# Patient Record
Sex: Male | Born: 1975 | Race: White | Hispanic: No | Marital: Single | Smoking: Current every day smoker
Health system: Southern US, Community
[De-identification: ages and names within clinical notes are randomized; demographics above are authoritative.]

## PROBLEM LIST (undated history)

## (undated) DIAGNOSIS — I82409 Acute embolism and thrombosis of unspecified deep veins of unspecified lower extremity: Secondary | ICD-10-CM

## (undated) DIAGNOSIS — I2699 Other pulmonary embolism without acute cor pulmonale: Secondary | ICD-10-CM

## (undated) DIAGNOSIS — Z7901 Long term (current) use of anticoagulants: Secondary | ICD-10-CM

## (undated) DIAGNOSIS — I509 Heart failure, unspecified: Secondary | ICD-10-CM

## (undated) DIAGNOSIS — I4891 Unspecified atrial fibrillation: Secondary | ICD-10-CM

## (undated) DIAGNOSIS — I499 Cardiac arrhythmia, unspecified: Secondary | ICD-10-CM

## (undated) DIAGNOSIS — R011 Cardiac murmur, unspecified: Secondary | ICD-10-CM

## (undated) DIAGNOSIS — Z86718 Personal history of other venous thrombosis and embolism: Secondary | ICD-10-CM

## (undated) DIAGNOSIS — I428 Other cardiomyopathies: Secondary | ICD-10-CM

## (undated) HISTORY — PX: INSERTION OF VENA CAVA FILTER: SHX5871

## (undated) HISTORY — DX: Heart failure, unspecified: I50.9

---
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
## 2005-12-10 ENCOUNTER — Emergency Department: Payer: Self-pay | Admitting: Emergency Medicine

## 2005-12-10 ENCOUNTER — Other Ambulatory Visit: Payer: Self-pay

## 2005-12-21 ENCOUNTER — Other Ambulatory Visit: Payer: Self-pay

## 2005-12-22 ENCOUNTER — Inpatient Hospital Stay: Payer: Self-pay

## 2007-05-11 IMAGING — CR DG CHEST 2V
1 series · 2 of 2 positions shown · non-contrast
Comparison: none

REASON FOR EXAM: cp
COMMENTS:  LMP: (Male)

[Series 3202: postero_anterior · 0.11mm/px · 2 of 2 slices shown]
[im 1/2]
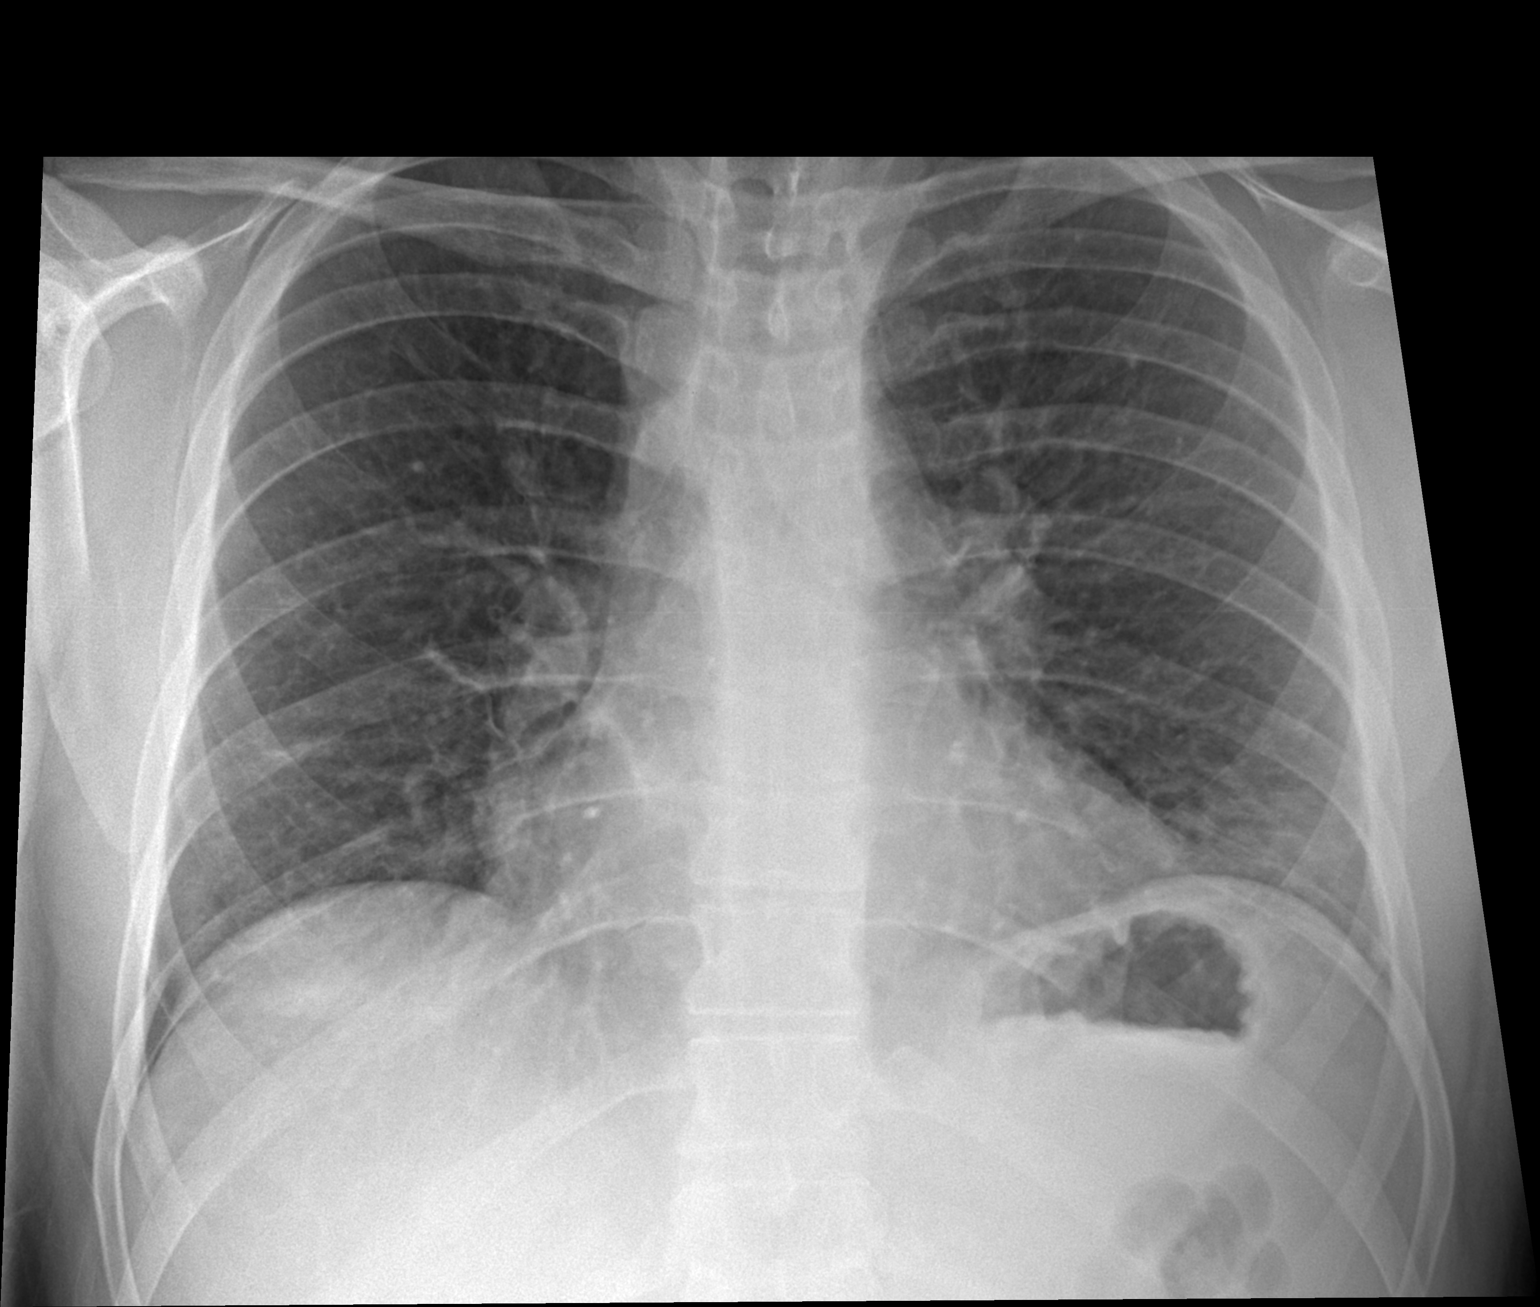
[im 2/2]
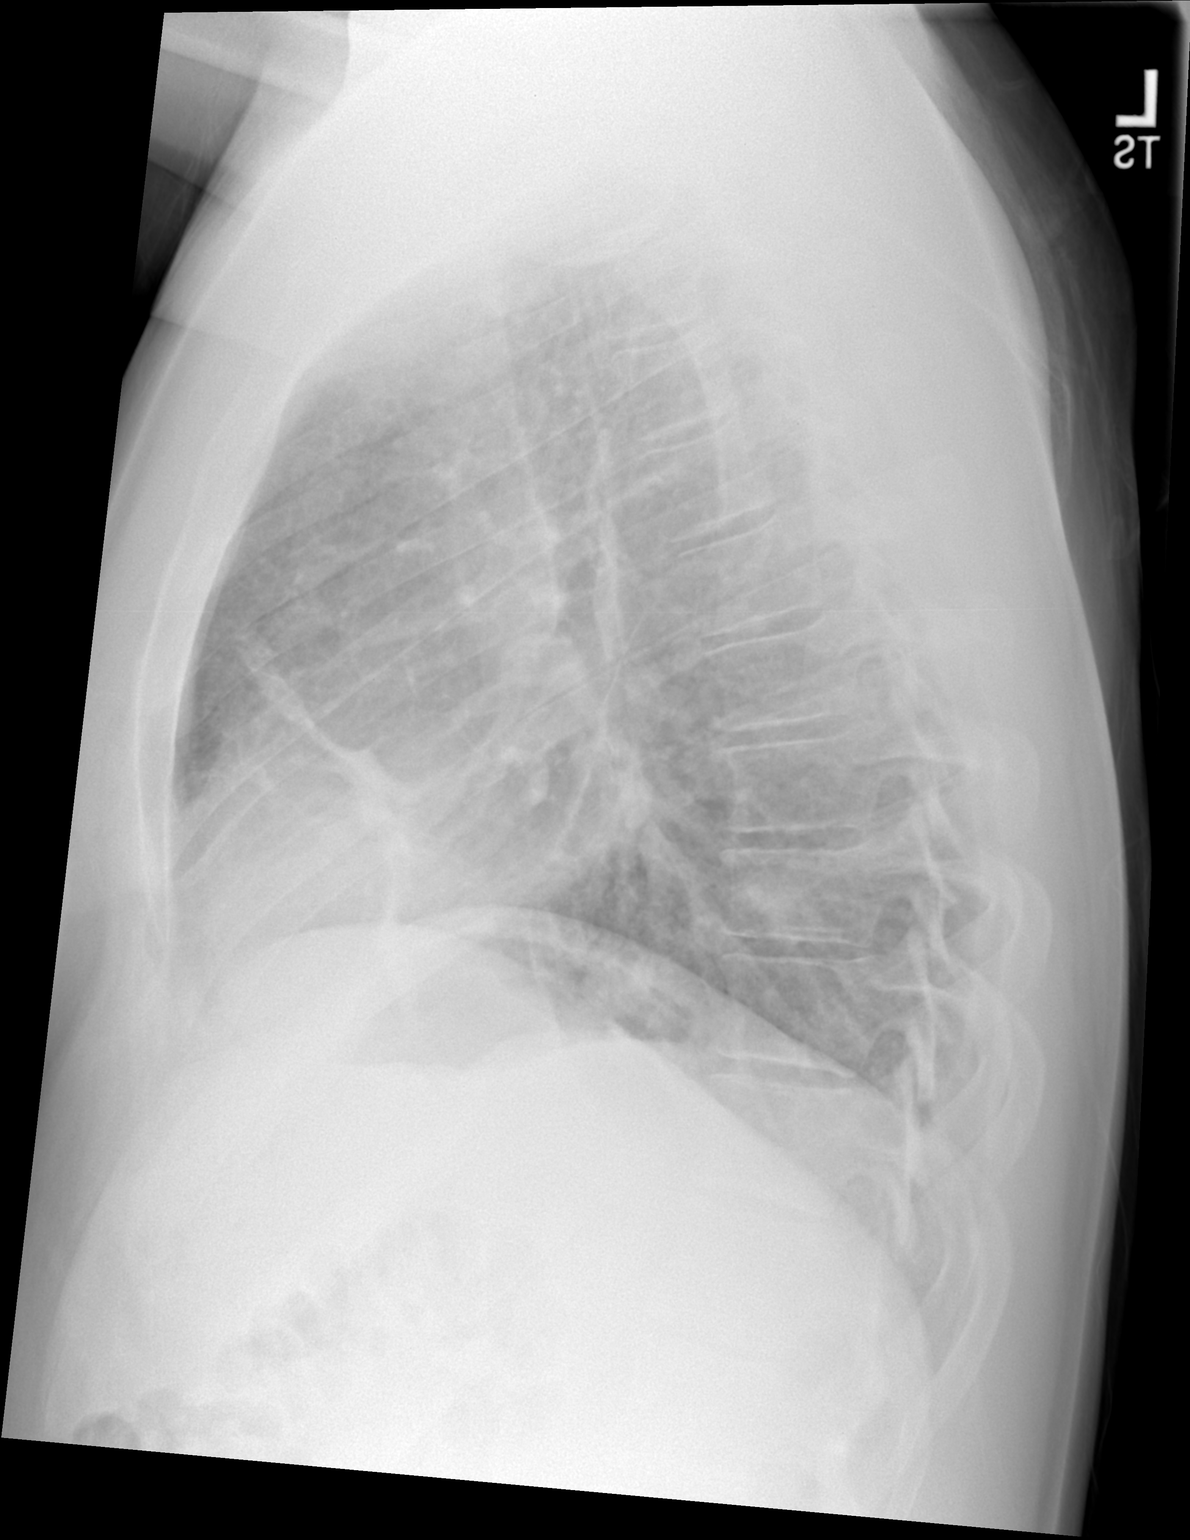

[2 of 2 positions shown; findings below may reference images not displayed]

PROCEDURE:     DXR - DXR CHEST PA (OR AP) AND LATERAL  - December 10, 2005  [DATE]

RESULT:     There is some minimally increased density at the LEFT lung base
and there is increased density projecting anteriorly on the lateral.
Increased linear density appears to be present at the RIGHT lung base.  The
heart is at the upper limits of normal in size.  There is no effusion.
There is no pneumothorax.
IMPRESSION: Areas of increased density of both lung bases which can be consistent with
areas of bibasilar infiltrate or atelectasis.  Pleural or parenchymal
fibrotic changes could give a similar appearance.

## 2010-04-30 ENCOUNTER — Ambulatory Visit: Payer: Self-pay | Admitting: Oncology

## 2010-05-12 ENCOUNTER — Inpatient Hospital Stay: Payer: Self-pay | Admitting: Internal Medicine

## 2010-05-20 ENCOUNTER — Ambulatory Visit: Payer: Self-pay | Admitting: Internal Medicine

## 2010-05-30 ENCOUNTER — Ambulatory Visit: Payer: Self-pay | Admitting: Oncology

## 2011-03-24 ENCOUNTER — Emergency Department: Payer: Self-pay | Admitting: Emergency Medicine

## 2013-09-27 ENCOUNTER — Emergency Department: Payer: Self-pay | Admitting: Emergency Medicine

## 2013-09-27 LAB — CBC WITH DIFFERENTIAL/PLATELET
Basophil #: 0 10*3/uL (ref 0.0–0.1)
Basophil %: 0.5 %
Eosinophil #: 0.4 10*3/uL (ref 0.0–0.7)
Eosinophil %: 5 %
HCT: 45.7 % (ref 40.0–52.0)
HGB: 15.9 g/dL (ref 13.0–18.0)
Lymphocyte #: 3.4 10*3/uL (ref 1.0–3.6)
Lymphocyte %: 39.9 %
MCH: 31.2 pg (ref 26.0–34.0)
MCHC: 34.7 g/dL (ref 32.0–36.0)
MCV: 90 fL (ref 80–100)
Monocyte #: 0.6 x10 3/mm (ref 0.2–1.0)
Monocyte %: 7.5 %
Neutrophil #: 4.1 10*3/uL (ref 1.4–6.5)
Neutrophil %: 47.1 %
Platelet: 216 10*3/uL (ref 150–440)
RBC: 5.08 10*6/uL (ref 4.40–5.90)
RDW: 12.7 % (ref 11.5–14.5)
WBC: 8.6 10*3/uL (ref 3.8–10.6)

## 2013-09-27 LAB — COMPREHENSIVE METABOLIC PANEL
Albumin: 3.8 g/dL (ref 3.4–5.0)
Alkaline Phosphatase: 94 U/L (ref 50–136)
Anion Gap: 5 — ABNORMAL LOW (ref 7–16)
BUN: 15 mg/dL (ref 7–18)
Bilirubin,Total: 0.5 mg/dL (ref 0.2–1.0)
Calcium, Total: 9.2 mg/dL (ref 8.5–10.1)
Chloride: 105 mmol/L (ref 98–107)
Co2: 26 mmol/L (ref 21–32)
Creatinine: 0.93 mg/dL (ref 0.60–1.30)
EGFR (African American): >60
EGFR (Non-African Amer.): >60
Glucose: 98 mg/dL (ref 65–99)
Osmolality: 273 (ref 275–301)
Potassium: 3.9 mmol/L (ref 3.5–5.1)
SGOT(AST): 23 U/L (ref 15–37)
SGPT (ALT): 51 U/L (ref 12–78)
Sodium: 136 mmol/L (ref 136–145)
Total Protein: 7.4 g/dL (ref 6.4–8.2)

## 2013-09-27 LAB — TROPONIN I: Troponin-I: <0.02 ng/mL

## 2013-09-27 LAB — TSH: Thyroid Stimulating Horm: 2.11 u[IU]/mL

## 2014-03-29 ENCOUNTER — Encounter: Payer: Self-pay | Admitting: Cardiovascular Disease

## 2014-04-03 ENCOUNTER — Encounter: Payer: Self-pay | Admitting: Cardiovascular Disease

## 2015-03-15 ENCOUNTER — Emergency Department
Admit: 2015-03-15 | Disposition: A | Discharge status: Home / Self Care | Payer: Self-pay | Admitting: Emergency Medicine

## 2015-04-07 ENCOUNTER — Encounter: Payer: Self-pay | Admitting: *Deleted

## 2015-04-07 ENCOUNTER — Emergency Department
Admission: EM | Admit: 2015-04-07 | Discharge: 2015-04-07 | Disposition: A | Discharge status: Home / Self Care | Payer: Self-pay | Attending: Emergency Medicine | Admitting: Emergency Medicine

## 2015-04-07 DIAGNOSIS — K029 Dental caries, unspecified: Secondary | ICD-10-CM | POA: Insufficient documentation

## 2015-04-07 DIAGNOSIS — Z720 Tobacco use: Secondary | ICD-10-CM | POA: Insufficient documentation

## 2015-04-07 DIAGNOSIS — K047 Periapical abscess without sinus: Secondary | ICD-10-CM | POA: Insufficient documentation

## 2015-04-07 HISTORY — DX: Personal history of other venous thrombosis and embolism: Z86.718

## 2015-04-07 MED ORDER — KETOROLAC TROMETHAMINE 10 MG PO TABS: 10.0000 mg | ORAL_TABLET | Freq: Three times a day (TID) | ORAL | Status: DC

## 2015-04-07 MED ORDER — PENICILLIN V POTASSIUM 500 MG PO TABS: 500.0000 mg | ORAL_TABLET | Freq: Four times a day (QID) | ORAL | Status: DC

## 2015-04-07 NOTE — Discharge Instructions (Signed)
Abscess An abscess (boil or furuncle) is an infected area on or under the skin. This area is filled with yellowish-white fluid (pus) and other material (debris). HOME CARE   Only take medicines as told by your doctor.  If you were given antibiotic medicine, take it as directed. Finish the medicine even if you start to feel better.  If gauze is used, follow your doctor's directions for changing the gauze.  To avoid spreading the infection:  Keep your abscess covered with a bandage.  Wash your hands well.  Do not share personal care items, towels, or whirlpools with others.  Avoid skin contact with others.  Keep your skin and clothes clean around the abscess.  Keep all doctor visits as told. GET HELP RIGHT AWAY IF:   You have more pain, puffiness (swelling), or redness in the wound site.  You have more fluid or blood coming from the wound site.  You have muscle aches, chills, or you feel sick.  You have a fever. MAKE SURE YOU:   Understand these instructions.  Will watch your condition.  Will get help right away if you are not doing well or get worse. Document Released: 05/04/2008 Document Revised: 05/17/2012 Document Reviewed: 01/29/2012 [facility] Patient Information 2015 [place], [place]. This information is not intended to replace advice given to you by your health care provider. Make sure you discuss any questions you have with your health care provider.  Dental Care and Dentist Visits Dental care supports good overall health. Regular dental visits can also help you avoid dental pain, bleeding, infection, and other more serious health problems in the future. It is important to keep the mouth healthy because diseases in the teeth, gums, and other oral tissues can spread to other areas of the body. Some problems, such as diabetes, heart disease, and pre-term labor have been associated with poor oral health.  See your dentist every 6 months. If you experience emergency  problems such as a toothache or broken tooth, go to the dentist right away. If you see your dentist regularly, you may catch problems early. It is easier to be treated for problems in the early stages.  WHAT TO EXPECT AT A DENTIST VISIT  Your dentist will look for many common oral health problems and recommend proper treatment. At your regular dental visit, you can expect:  Gentle cleaning of the teeth and gums. This includes scraping and polishing. This helps to remove the sticky substance around the teeth and gums (plaque). Plaque forms in the mouth shortly after eating. Over time, plaque hardens on the teeth as tartar. If tartar is not removed regularly, it can cause problems. Cleaning also helps remove stains.  Periodic X-rays. These pictures of the teeth and supporting bone will help your dentist assess the health of your teeth.  Periodic fluoride treatments. Fluoride is a natural mineral shown to help strengthen teeth. Fluoride treatmentinvolves applying a fluoride gel or varnish to the teeth. It is most commonly done in children.  Examination of the mouth, tongue, jaws, teeth, and gums to look for any oral health problems, such as:  Cavities (dental caries). This is decay on the tooth caused by plaque, sugar, and acid in the mouth. It is best to catch a cavity when it is small.  Inflammation of the gums caused by plaque buildup (gingivitis).  Problems with the mouth or malformed or misaligned teeth.  Oral cancer or other diseases of the soft tissues or jaws. KEEP YOUR TEETH AND GUMS HEALTHY For  healthy teeth and gums, follow these general guidelines as well as your dentist's specific advice:  Have your teeth professionally cleaned at the dentist every 6 months.  Brush twice daily with a fluoride toothpaste.  Floss your teeth daily.  Ask your dentist if you need fluoride supplements, treatments, or fluoride toothpaste.  Eat a healthy diet. Reduce foods and drinks with added  sugar.  Avoid smoking. TREATMENT FOR ORAL HEALTH PROBLEMS If you have oral health problems, treatment varies depending on the conditions present in your teeth and gums.  Your caregiver will most likely recommend good oral hygiene at each visit.  For cavities, gingivitis, or other oral health disease, your caregiver will perform a procedure to treat the problem. This is typically done at a separate appointment. Sometimes your caregiver will refer you to another dental specialist for specific tooth problems or for surgery. SEEK IMMEDIATE DENTAL CARE IF:  You have pain, bleeding, or soreness in the gum, tooth, jaw, or mouth area.  A permanent tooth becomes loose or separated from the gum socket.  You experience a blow or injury to the mouth or jaw area. Document Released: 07/29/2011 Document Revised: 02/08/2012 Document Reviewed: 07/29/2011 [facility] Patient Information 2015 [place], [place]. This information is not intended to replace advice given to you by your health care provider. Make sure you discuss any questions you have with your health care provider.  OPTIONS FOR DENTAL FOLLOW UP CARE  [place] Department of Health and Human Services - Local Safety Net Dental Clinics TripDoors.com.htm   [facility] [phone])  [facility] [phone])  [place] [phone])  [facility] [phone])  [facility] [phone]) This clinic caters to the indigent population and is on a lottery system. Location: [employer] [facility], [employer], [address] [place] Clinic Hours: Wednesdays from 6pm - 9pm, patients seen by a lottery system. For dates, call or go to [email or web address] Services: Cleanings, fillings and simple extractions. Payment Options: DENTAL WORK IS FREE OF CHARGE. Bring proof of income or support. Best way to  get seen: Arrive at 5:15 pm - this is a lottery, NOT first come/first serve, so arriving earlier will not increase your chances of being seen.     [facility] [phone] Select option 1 for emergencies   Location: [facility], [place], [address], [place] Clinic Hours: No walk-ins accepted - call the day before to schedule an appointment. Check in times are 9:30 am and 1:30 pm. Services: Simple extractions, temporary fillings, pulpectomy/pulp debridement, uncomplicated abscess drainage. Payment Options: PAYMENT IS DUE AT THE TIME OF SERVICE.  Fee is usually $100-200, additional surgical procedures (e.g. abscess drainage) may be extra. Cash, checks, Visa/MasterCard accepted.  Can file Medicaid if patient is covered for dental - patient should call case worker to check. No discount for [facility] patients. Best way to get seen: MUST call the day before and get onto the schedule. Can usually be seen the next 1-2 days. No walk-ins accepted.     [facility] [phone]   Location: [facility], [address] [place] Clinic Hours: M, W, Th, F 8am or 1:30pm, Tues 9a or 1:30 - first come/first served. Services: Simple extractions, temporary fillings, uncomplicated abscess drainage.  You do not need to be an [facility] resident. Payment Options: PAYMENT IS DUE AT THE TIME OF SERVICE. Dental insurance, otherwise sliding scale - bring proof of  income or support. Depending on income and treatment needed, cost is usually $50-200. Best way to get seen: Arrive early as it is first come/first served.     [facility] [phone]   Location: [address] Clinic Hours: Mon-Thu 8a-5p Services: Most basic dental services including extractions and fillings. Payment Options: PAYMENT IS DUE AT THE TIME OF SERVICE. Sliding scale, up to 50% off  - bring proof if income or support. Medicaid with dental option accepted. Best way to get seen: Call to schedule an appointment, can usually be seen within 2 weeks OR they will try to see walk-ins - show up at 8a or 2p (you may have to wait).     [facility] [phone] [place] RESIDENTS ONLY   Location: [facility], [address] [place], [place] [address] Clinic Hours: By appointment only. Monday - Thursday 8am-5pm, Friday 8am-12pm Services: Cleanings, fillings, extractions. Payment Options: PAYMENT IS DUE AT THE TIME OF SERVICE. Cash, Visa or MasterCard. Sliding scale - $30 minimum per service. Best way to get seen: Come in to office, complete packet and make an appointment - need proof of income or support monies for each household member and proof of [facility] residence. Usually takes about a month to get in.     [facility] [phone]   Location: [address] [facility] Clinic Hours: Walk-in Urgent Care Dental Services are offered Monday-Friday mornings only. The numbers of emergencies accepted daily is limited to the number of providers available. Maximum 15 - Mondays, Wednesdays & Thursdays Maximum 10 - Tuesdays & Fridays Services: You do not need to be a [facility] resident to be seen for a dental emergency. Emergencies are defined as pain, swelling, abnormal bleeding, or dental trauma. Walkins will receive x-rays if needed. NOTE: Dental cleaning is not an emergency. Payment Options: PAYMENT IS DUE AT THE TIME OF SERVICE. Minimum co-pay is $40.00 for uninsured patients. Minimum co-pay is $3.00 for Medicaid with dental coverage. Dental Insurance is accepted and must be presented at time of visit. Medicare does not cover dental. Forms of payment: Cash, credit card, checks. Best way to get seen: If not previously registered with the clinic, walk-in dental registration begins at 7:15  am and is on a first come/first serve basis. If previously registered with the clinic, call to make an appointment.     [facility] [phone] [place] RESIDENTS ONLY   Location: [address], [place], [place] Clinic Hours: Mon-Thu 10a-2p Services: Extractions only! Payment Options: FREE (donations accepted) - bring proof of income or support Best way to get seen: Call and schedule an appointment OR come at 8am on the 1st Monday of every month (except for holidays) when it is first come/first served.     [facility] [phone]   Location: [address] [place], [place] Clinic Hours: Friday mornings Services, Payment Options, Best way to get seen: Call for info

## 2015-04-07 NOTE — ED Provider Notes (Signed)
[facility] Emergency Department Provider Note? ____________________________________________ ? Time seen:  1436 ? I have reviewed the triage vital signs and the nursing notes.  ________ HISTORY ? Chief Complaint No chief complaint on file.   HPI  Henry Dunn is a 39 y.o. male who reports to the ED with c/o left, lower jaw pain and swelling due to dental abscess x 1 week.  He denies fevers, chills, sweats, or nausea.  He has chronically broken molars and reports being referred for wisdom tooth extraction in the past.    Review of Systems  Constitutional: Negative for fever. Eyes: Negative for visual changes. ENT: Negative for sore throat. Cardiovascular: Negative for chest pain. Respiratory: Negative for shortness of breath. Gastrointestinal: Negative for abdominal pain, vomiting and diarrhea. Musculoskeletal: Negative for back pain. Skin: Negative for rash. Neurological: Negative for headaches, focal weakness or numbness.  10-point ROS otherwise negative. ____________________________________________  No past medical history on file.  There are no active problems to display for this patient. ? No past surgical history on file. ? Current Outpatient Rx  Name  Route  Sig  Dispense  Refill  . ketorolac (TORADOL) 10 MG tablet   Oral   Take 1 tablet (10 mg total) by mouth every 8 (eight) hours.   15 tablet   0   . penicillin v potassium (VEETID) 500 MG tablet   Oral   Take 1 tablet (500 mg total) by mouth 4 (four) times daily.   40 tablet   0    ? Allergies Review of patient's allergies indicates not on file. ? No family history on file. ? Social History History  Substance Use Topics  . Smoking status: Not on file  . Smokeless tobacco: Not on file  . Alcohol Use: Not on file   PHYSICAL EXAM:  VITAL SIGNS: ED Triage Vitals  Enc Vitals Group     BP --      Pulse --      Resp --      Temp --      Temp src --      SpO2 --       Weight --      Height --      Head Cir --      Peak Flow --      Pain Score --      Pain Loc --      Pain Edu? --      Excl. in GC? --    Constitutional: Alert and oriented. Well appearing and in no distress. Eyes: Conjunctivae are normal. PERRL. Normal extraocular movements. ENT   Head: Normocephalic and atraumatic.   Nose: No congestion/rhinnorhea.   Mouth/Throat: Mucous membranes are moist. Left lower molar #1 missing. Molar #2 with cavity & chronic fracture. Molar #3 Malpositioned. No local abscess formation or gum swelling at molars. Some mild jaw fullness at the lower eye teeth.       Ears: Normal external exam. Canals clear. TMs clear bilaterally.   Neck: Supple. No lymphadenopathy. Cardiovascular: Normal rate, regular rhythm. Normal and symmetric distal pulses are present in all extremities. No murmurs, rubs, or gallops. Respiratory: Normal respiratory effort without tachypnea nor retractions. Breath sounds are clear and equal bilaterally. No wheezes/rales/rhonchi. Musculoskeletal: Normal range of motion in all extremities.  Neurologic:  Normal speech and language. No gross focal neurologic deficits are appreciated.  Skin:  Skin is warm, dry and intact. No rash noted. Psychiatric: Mood and affect are normal. Patient  exhibits appropriate insight and judgment. _____________ PROCEDURES ? Procedure(s) performed: None  Critical Care performed: None  ______________________________________________________ INITIAL IMPRESSION / ASSESSMENT AND PLAN / ED COURSE ? Patient given info about local dental providers.   Pertinent labs & imaging results that were available during my care of the patient were reviewed by me and considered in my medical decision making (see chart for details). ___________________________________________ FINAL CLINICAL IMPRESSION(S) / ED DIAGNOSES?  Final diagnoses:  Dental caries  Dental infection      Lissa Hoard,  PA-C 04/07/15 1654  Jene Every, MD 04/07/15 2012

## 2015-04-07 NOTE — ED Notes (Signed)
Pt reports tooth pain beginning 4 weeks ago. Pt seen in ED 3 weeks ago. Pt verbalized not going to dentist and just finishing antibiotics from previous visit without relief.

## 2015-05-20 ENCOUNTER — Encounter: Payer: Self-pay | Admitting: Emergency Medicine

## 2015-05-20 ENCOUNTER — Emergency Department
Admission: EM | Admit: 2015-05-20 | Discharge: 2015-05-20 | Disposition: A | Discharge status: Home / Self Care | Payer: Self-pay | Attending: Emergency Medicine | Admitting: Emergency Medicine

## 2015-05-20 DIAGNOSIS — K047 Periapical abscess without sinus: Secondary | ICD-10-CM | POA: Insufficient documentation

## 2015-05-20 DIAGNOSIS — Z792 Long term (current) use of antibiotics: Secondary | ICD-10-CM | POA: Insufficient documentation

## 2015-05-20 DIAGNOSIS — Z720 Tobacco use: Secondary | ICD-10-CM | POA: Insufficient documentation

## 2015-05-20 MED ORDER — CLINDAMYCIN PHOSPHATE 900 MG/6ML IV SOLN
INTRAVENOUS | Status: AC
  Filled 2015-05-20: qty 6

## 2015-05-20 MED ORDER — IBUPROFEN 800 MG PO TABS: 800.0000 mg | ORAL_TABLET | Freq: Three times a day (TID) | ORAL | Status: DC

## 2015-05-20 MED ORDER — CLINDAMYCIN HCL 150 MG PO CAPS: ORAL_CAPSULE | ORAL | Status: DC

## 2015-05-20 MED ORDER — CLINDAMYCIN PHOSPHATE 600 MG/4ML IJ SOLN: 600.0000 mg | Freq: Once | INTRAMUSCULAR | Status: DC

## 2015-05-20 MED ORDER — CLINDAMYCIN PHOSPHATE 600 MG/4ML IJ SOLN
600.0000 mg | Freq: Once | INTRAMUSCULAR | Status: AC
  Administered 2015-05-20: 600 mg via INTRAMUSCULAR
  Filled 2015-05-20: qty 4

## 2015-05-20 NOTE — ED Notes (Signed)
Pt reports that he has been having trouble with his wisdom teeth. He states that he felt pressure last night when he was eating and woke up with right sided facial swelling. States that tooth is not hurting at this time.

## 2015-05-20 NOTE — Discharge Instructions (Signed)
Dental Abscess °A dental abscess is a collection of infected fluid (pus) from a bacterial infection in the inner part of the tooth (pulp). It usually occurs at the end of the tooth's root.  °CAUSES  °· Severe tooth decay. °· Trauma to the tooth that allows bacteria to enter into the pulp, such as a broken or chipped tooth. °SYMPTOMS  °· Severe pain in and around the infected tooth. °· Swelling and redness around the abscessed tooth or in the mouth or face. °· Tenderness. °· Pus drainage. °· Bad breath. °· Bitter taste in the mouth. °· Difficulty swallowing. °· Difficulty opening the mouth. °· Nausea. °· Vomiting. °· Chills. °· Swollen neck glands. °DIAGNOSIS  °· A medical and dental history will be taken. °· An examination will be performed by tapping on the abscessed tooth. °· X-rays may be taken of the tooth to identify the abscess. °TREATMENT °The goal of treatment is to eliminate the infection. You may be prescribed antibiotic medicine to stop the infection from spreading. A root canal may be performed to save the tooth. If the tooth cannot be saved, it may be pulled (extracted) and the abscess may be drained.  °HOME CARE INSTRUCTIONS °· Only take over-the-counter or prescription medicines for pain, fever, or discomfort as directed by your caregiver. °· Rinse your mouth (gargle) often with salt water (¼ tsp salt in 8 oz [250 ml] of warm water) to relieve pain or swelling. °· Do not drive after taking pain medicine (narcotics). °· Do not apply heat to the outside of your face. °· Return to your dentist for further treatment as directed. °SEEK MEDICAL CARE IF: °· Your pain is not helped by medicine. °· Your pain is getting worse instead of better. °SEEK IMMEDIATE MEDICAL CARE IF: °· You have a fever or persistent symptoms for more than 2-3 days. °· You have a fever and your symptoms suddenly get worse. °· You have chills or a very bad headache. °· You have problems breathing or swallowing. °· You have trouble  opening your mouth. °· You have swelling in the neck or around the eye. °Document Released: 11/16/2005 Document Revised: 08/10/2012 Document Reviewed: 02/24/2011 °Exit[employer]® Patient Information ©2015 [employer]. This information is not intended to replace advice given to you by your health care provider. Make sure you discuss any questions you have with your health care provider. ° ° °OPTIONS FOR DENTAL FOLLOW UP CARE ° °[place] Department of Health and Human Services - Local Safety Net Dental Clinics °http://www.ncdhhs.gov/dph/oralhealth/services/safetynetclinics.htm °  °[facility] ([phone]) ° °[facility] ([phone]) ° °[facility] ([phone]) ° °[facility] ([phone]) ° °[facility] ([phone]) °This clinic caters to the indigent population and is on a lottery system. °Location: °[facility], [place], [address], [place] °Clinic Hours: °Wednesdays from 6pm - 9pm, patients seen by a lottery system. °For dates, call or go to www.med.[facility].edu/[facility]/patients/Dental-[facility] °Services: °Cleanings, fillings and simple extractions. °Payment Options: °DENTAL WORK IS FREE OF CHARGE. Bring proof of income or support. °Best way to get seen: °Arrive at 5:15 pm - this is a lottery, NOT first come/first serve, so arriving earlier will not increase your chances of being seen. °  °  °[facility] °[phone] °Select option 1 for emergencies °  °Location: °[facility] [place], [address], [place] °Clinic Hours: °No walk-ins accepted - call the day before to schedule an appointment. °Check in times   are 9:30 am and 1:30 pm. °Services: °Simple extractions, temporary fillings, pulpectomy/pulp debridement, uncomplicated abscess drainage. °Payment Options: °PAYMENT IS DUE AT THE TIME OF SERVICE.  Fee is usually $100-200, additional surgical procedures (e.g. abscess drainage) may  be extra. °Cash, checks, Visa/MasterCard accepted.  Can file Medicaid if patient is covered for dental - patient should call case worker to check. °No discount for [facility] patients. °Best way to get seen: °MUST call the day before and get onto the schedule. Can usually be seen the next 1-2 days. No walk-ins accepted. °  °  °[facility] °[phone] °  °Location: °[facility], [address], [place] °Clinic Hours: °M, W, Th, F 8am or 1:30pm, Tues 9a or 1:30 - first come/first served. °Services: °Simple extractions, temporary fillings, uncomplicated abscess drainage.  You do not need to be an [place] resident. °Payment Options: °PAYMENT IS DUE AT THE TIME OF SERVICE. °Dental insurance, otherwise sliding scale - bring proof of income or support. °Depending on income and treatment needed, cost is usually $50-200. °Best way to get seen: °Arrive early as it is first come/first served. °  °  °[facility] °[phone] °  °Location: °[address] °Clinic Hours: °Mon-Thu 8a-5p °Services: °Most basic dental services including extractions and fillings. °Payment Options: °PAYMENT IS DUE AT THE TIME OF SERVICE. °Sliding scale, up to 50% off - bring proof if income or support. °Medicaid with dental option accepted. °Best way to get seen: °Call to schedule an appointment, can usually be seen within 2 weeks OR they will try to see walk-ins - show up at 8a or 2p (you may have to wait). °  °  °[facility] °[phone] °[place] RESIDENTS ONLY °  °Location: °[facility], [address], [place], [place] [address] °Clinic Hours: By appointment only. °Monday - Thursday 8am-5pm, Friday 8am-12pm °Services: Cleanings, fillings, extractions. °Payment Options: °PAYMENT IS DUE AT THE TIME OF SERVICE. °Cash, Visa or MasterCard. Sliding scale - $30 minimum per service. °Best way to get seen: °Come in to  office, complete packet and make an appointment - need proof of income °or support monies for each household member and proof of [place] residence. °Usually takes about a month to get in. °  °  °[facility] °[phone] °  °Location: °[address] [place] °Clinic Hours: Walk-in Urgent Care Dental Services are offered Monday-Friday mornings only. °The numbers of emergencies accepted daily is limited to the number of °providers available. °Maximum 15 - Mondays, Wednesdays & Thursdays °Maximum 10 - Tuesdays & Fridays °Services: °You do not need to be a [place] resident to be seen for a dental emergency. °Emergencies are defined as pain, swelling, abnormal bleeding, or dental trauma. Walkins will receive x-rays if needed. °NOTE: Dental cleaning is not an emergency. °Payment Options: °PAYMENT IS DUE AT THE TIME OF SERVICE. °Minimum co-pay is $40.00 for uninsured patients. °Minimum co-pay is $3.00 for Medicaid with dental coverage. °Dental Insurance is accepted and must be presented at time of visit. °Medicare does not cover dental. °Forms of payment: Cash, credit card, checks. °Best way to get seen: °If not previously registered with the clinic, walk-in dental registration begins at 7:15 am and is on a first come/first serve basis. °If previously registered with the clinic, call to make an appointment. °  °  °[facility] °[phone] °[place] RESIDENTS ONLY °  °Location: °[address],   [place], [place] °Clinic Hours: °Mon-Thu 10a-2p °Services: Extractions only! °Payment Options: °FREE (donations accepted) - bring proof of income or support °Best way to get seen: °Call and schedule an appointment OR come at 8am on the 1st Monday of every month (except for holidays) when it is first come/first served. °  °  °[facility] °[phone] °  °Location: °[address], [place] °Clinic Hours: °Friday mornings °Services, Payment Options, Best way to get  seen: °Call for info °

## 2015-05-20 NOTE — ED Notes (Signed)
Pt informed to return if any life threatening symptoms occur.  

## 2015-05-20 NOTE — ED Provider Notes (Signed)
[facility] Emergency Department Provider Note  ____________________________________________  Time seen:  10:21 AM  I have reviewed the triage vital signs and the nursing notes.   HISTORY  Chief Complaint Oral Swelling   HPI Henry Dunn is a 39 y.o. male complaining with right-sided facial swelling. He states that it was a little swollen last night, but much worse this morning. He states his teeth do not hurt but he has had problems with his teeth in the past especially his wisdom teeth. He has been taking an over-the-counter "Chinese amoxicillin" off and on for a month. He denies any fever or chills until last night he felt a little warm as temperature was 100.4. Currently his pain is 0/10.Marland Kitchen   Past Medical History  Diagnosis Date  . Hx of blood clots     in left legs and lungs    There are no active problems to display for this patient.   History reviewed. No pertinent past surgical history.  Current Outpatient Rx  Name  Route  Sig  Dispense  Refill  . clindamycin (CLEOCIN) 150 MG capsule      2 tablets q 6hours until finished   56 capsule   0   . ibuprofen (ADVIL,MOTRIN) 800 MG tablet   Oral   Take 1 tablet (800 mg total) by mouth 3 (three) times daily.   30 tablet   0   . ketorolac (TORADOL) 10 MG tablet   Oral   Take 1 tablet (10 mg total) by mouth every 8 (eight) hours.   15 tablet   0   . penicillin v potassium (VEETID) 500 MG tablet   Oral   Take 1 tablet (500 mg total) by mouth 4 (four) times daily.   40 tablet   0     Allergies Review of patient's allergies indicates no known allergies.  History reviewed. No pertinent family history.  Social History History  Substance Use Topics  . Smoking status: Current Every Day Smoker -- 0.50 packs/day    Types: Cigarettes  . Smokeless tobacco: Not on file  . Alcohol Use: Yes    Review of Systems Constitutional: Positive fever/no chills Eyes: No visual changes. ENT: No sore  throat. Cardiovascular: Denies chest pain. Respiratory: Denies shortness of breath. Gastrointestinal: No abdominal pain.  No nausea, no vomiting Genitourinary: Negative for dysuria. Musculoskeletal: Negative for back pain. Skin: Negative for rash. Neurological: Negative for headaches, focal weakness or numbness.  10-point ROS otherwise negative.  ____________________________________________   PHYSICAL EXAM:  VITAL SIGNS: ED Triage Vitals  Enc Vitals Group     BP 05/20/15 1007 141/116 mmHg     Pulse Rate 05/20/15 1007 100     Resp 05/20/15 1007 19     Temp 05/20/15 1007 98.2 F (36.8 C)     Temp Source 05/20/15 1007 Oral     SpO2 05/20/15 1007 99 %     Weight 05/20/15 1007 190 lb (86.183 kg)     Height 05/20/15 1007  (1.727 m)     Head Cir --      Peak Flow --      Pain Score --      Pain Loc --      Pain Edu? --      Excl. in GC? --     Constitutional: Alert and oriented. Well appearing and in no acute distress. Eyes: Conjunctivae are normal. PERRL. EOMI. Head: Atraumatic. Right facial edema without edema right orbital area Nose: No  congestion/rhinnorhea. Mouth/Throat: Mucous membranes are moist.  Oropharynx non-erythematous. Numerous cavities was noted.  His right upper wisdom tooth is actually below the gumline. There is moderate amount of gum edema and tenderness. No drainage was noted. Neck: No stridor.  Supple Hematological/Lymphatic/Immunilogical: No cervical lymphadenopathy. Cardiovascular: Normal rate, regular rhythm. Grossly normal heart sounds.  Good peripheral circulation. Respiratory: Normal respiratory effort.  No retractions. Lungs CTAB. Gastrointestinal: Soft and nontender. No distention. No abdominal bruits. No CVA tenderness. Musculoskeletal: No lower extremity tenderness nor edema.  No joint effusions. Neurologic:  Normal speech and language. No gross focal neurologic deficits are appreciated. Speech is normal. No gait instability. Skin:  Skin is  warm, dry and intact. No rash noted. Psychiatric: Mood and affect are normal. Speech and behavior are normal.  ____________________________________________   LABS (all labs ordered are listed, but only abnormal results are displayed)  Labs Reviewed - No data to display _ PROCEDURES  Procedure(s) performed: None  Critical Care performed: No  ____________________________________________   INITIAL IMPRESSION / ASSESSMENT AND PLAN / ED COURSE  Pertinent labs & imaging results that were available during my care of the patient were reviewed by me and considered in my medical decision making (see chart for details).  Patient is to discontinue the [place] medicine that he's been taking. He was started on clindamycin for infection ibuprofen for inflammation. He is given a list of dental clinics to follow up with. ____________________________________________   FINAL CLINICAL IMPRESSION(S) / ED DIAGNOSES  Final diagnoses:  Dental abscess      Tommi Rumps, PA-C 05/20/15 1345  Jene Every, MD 05/20/15 [phone]

## 2015-07-24 ENCOUNTER — Encounter: Payer: Self-pay | Admitting: Emergency Medicine

## 2015-07-24 ENCOUNTER — Emergency Department: Payer: Self-pay

## 2015-07-24 ENCOUNTER — Emergency Department
Admission: EM | Admit: 2015-07-24 | Discharge: 2015-07-24 | Disposition: A | Discharge status: Home / Self Care | Payer: Self-pay | Attending: Emergency Medicine | Admitting: Emergency Medicine

## 2015-07-24 DIAGNOSIS — I824Z2 Acute embolism and thrombosis of unspecified deep veins of left distal lower extremity: Secondary | ICD-10-CM | POA: Insufficient documentation

## 2015-07-24 DIAGNOSIS — M79605 Pain in left leg: Secondary | ICD-10-CM

## 2015-07-24 DIAGNOSIS — I82402 Acute embolism and thrombosis of unspecified deep veins of left lower extremity: Secondary | ICD-10-CM

## 2015-07-24 DIAGNOSIS — Z720 Tobacco use: Secondary | ICD-10-CM | POA: Insufficient documentation

## 2015-07-24 NOTE — ED Notes (Signed)
Reports hx of dvt, has not been taking meds.  Now has swelling to left lower ext.  Ambulates well.  +PMS to distal foot

## 2015-07-24 NOTE — ED Provider Notes (Signed)
[facility] Emergency Department Provider Note     Time seen: ----------------------------------------- 2:48 PM on 07/24/2015 -----------------------------------------    I have reviewed the triage vital signs and the nursing notes.   HISTORY  Chief Complaint Leg Swelling    HPI Henry Dunn is a 39 y.o. male who presents to ER for left lower extremity swelling.Patient states he had a history of left lower extremity blood clot and bilateral PEs. He's had a DVT filter put in, has complained of left leg cramping and swelling over the last several days. Patient states she stands a lot at work, denies fevers chills or other complaints.   Past Medical History  Diagnosis Date  . Hx of blood clots     in left legs and lungs    There are no active problems to display for this patient.   History reviewed. No pertinent past surgical history.  Allergies Review of patient's allergies indicates no known allergies.  Social History Social History  Substance Use Topics  . Smoking status: Current Every Day Smoker -- 0.50 packs/day    Types: Cigarettes  . Smokeless tobacco: None  . Alcohol Use: Yes    Review of Systems Constitutional: Negative for fever. Eyes: Negative for visual changes. ENT: Negative for sore throat. Cardiovascular: Negative for chest pain. Respiratory: Negative for shortness of breath. Gastrointestinal: Negative for abdominal pain, vomiting and diarrhea. Genitourinary: Negative for dysuria. Musculoskeletal: Negative for back pain. Positive for left lower extremity swelling and pain  Skin: Negative for rash. Neurological: Negative for headaches, focal weakness or numbness.  10-point ROS otherwise negative.  ____________________________________________   PHYSICAL EXAM:  VITAL SIGNS: ED Triage Vitals  Enc Vitals Group     BP 07/24/15 1309 126/85 mmHg     Pulse Rate 07/24/15 1309 81     Resp 07/24/15 1305 18     Temp  07/24/15 1305 99 F (37.2 C)     Temp Source 07/24/15 1305 Oral     SpO2 07/24/15 1309 98 %     Weight 07/24/15 1303 190 lb (86.183 kg)     Height 07/24/15 1303 5\' 8"  (1.727 m)     Head Cir --      Peak Flow --      Pain Score 07/24/15 1303 7     Pain Loc --      Pain Edu? --      Excl. in GC? --     Constitutional: Alert and oriented. Well appearing and in no distress. Eyes: Conjunctivae are normal. PERRL. Normal extraocular movements. ENT   Head: Normocephalic and atraumatic.   Nose: No congestion/rhinnorhea.   Mouth/Throat: Mucous membranes are moist.   Neck: No stridor. Cardiovascular: Normal rate, regular rhythm. Normal and symmetric distal pulses are present in all extremities. No murmurs, rubs, or gallops. Respiratory: Normal respiratory effort without tachypnea nor retractions. Breath sounds are clear and equal bilaterally. No wheezes/rales/rhonchi. Gastrointestinal: Soft and nontender. No distention. No abdominal bruits.  Musculoskeletal: Nontender with normal range of motion in all extremities. No joint effusions.  No lower extremity tenderness nor edema. Neurologic:  Normal speech and language. No gross focal neurologic deficits are appreciated. Speech is normal. No gait instability. Skin:  Skin is warm, dry and intact. No rash noted. Psychiatric: Mood and affect are normal. Speech and behavior are normal. Patient exhibits appropriate insight and judgment. ____________________________________________  ED COURSE:  Pertinent labs & imaging results that were available during my care of the patient were reviewed by me  and considered in my medical decision making (see chart for details). Patient will need left lower extremity ultrasound.  RADIOLOGY Images were viewed by me   IMPRESSION: Nonocclusive thrombus again noted in the superficial femoral and popliteal vein. Similar finding noted on prior study  09/27/2013.  ____________________________________________  FINAL ASSESSMENT AND PLAN  DVT  Plan: Patient with labs and imaging as dictated above. 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 We'll place him back on Eliquis, he was given a prescription and a [facility] for same. He is advised to have close follow-up with a primary care doctor for reevaluation.   Emily Filbert, MD   Emily Filbert, MD 07/24/15 [phone]

## 2017-02-17 ENCOUNTER — Emergency Department: Payer: Self-pay

## 2017-02-17 ENCOUNTER — Inpatient Hospital Stay
Admission: EM | Admit: 2017-02-17 | Discharge: 2017-02-25 | DRG: 300 | Disposition: A | Discharge status: Home / Self Care | Payer: Self-pay | Attending: Internal Medicine | Admitting: Internal Medicine

## 2017-02-17 DIAGNOSIS — I82412 Acute embolism and thrombosis of left femoral vein: Principal | ICD-10-CM | POA: Diagnosis present

## 2017-02-17 DIAGNOSIS — Z86711 Personal history of pulmonary embolism: Secondary | ICD-10-CM

## 2017-02-17 DIAGNOSIS — I4892 Unspecified atrial flutter: Secondary | ICD-10-CM | POA: Diagnosis present

## 2017-02-17 DIAGNOSIS — F17210 Nicotine dependence, cigarettes, uncomplicated: Secondary | ICD-10-CM | POA: Diagnosis present

## 2017-02-17 DIAGNOSIS — Z9114 Patient's other noncompliance with medication regimen: Secondary | ICD-10-CM

## 2017-02-17 DIAGNOSIS — L02419 Cutaneous abscess of limb, unspecified: Secondary | ICD-10-CM

## 2017-02-17 DIAGNOSIS — I82432 Acute embolism and thrombosis of left popliteal vein: Secondary | ICD-10-CM | POA: Diagnosis present

## 2017-02-17 DIAGNOSIS — I82509 Chronic embolism and thrombosis of unspecified deep veins of unspecified lower extremity: Secondary | ICD-10-CM

## 2017-02-17 DIAGNOSIS — F17200 Nicotine dependence, unspecified, uncomplicated: Secondary | ICD-10-CM

## 2017-02-17 DIAGNOSIS — I824Z2 Acute embolism and thrombosis of unspecified deep veins of left distal lower extremity: Secondary | ICD-10-CM

## 2017-02-17 DIAGNOSIS — L03116 Cellulitis of left lower limb: Secondary | ICD-10-CM | POA: Diagnosis present

## 2017-02-17 DIAGNOSIS — L02214 Cutaneous abscess of groin: Secondary | ICD-10-CM | POA: Diagnosis present

## 2017-02-17 DIAGNOSIS — Z86718 Personal history of other venous thrombosis and embolism: Secondary | ICD-10-CM

## 2017-02-17 DIAGNOSIS — M545 Low back pain: Secondary | ICD-10-CM

## 2017-02-17 DIAGNOSIS — I4430 Unspecified atrioventricular block: Secondary | ICD-10-CM | POA: Diagnosis present

## 2017-02-17 DIAGNOSIS — Z720 Tobacco use: Secondary | ICD-10-CM

## 2017-02-17 DIAGNOSIS — I4891 Unspecified atrial fibrillation: Secondary | ICD-10-CM | POA: Diagnosis present

## 2017-02-17 DIAGNOSIS — M5440 Lumbago with sciatica, unspecified side: Secondary | ICD-10-CM | POA: Diagnosis present

## 2017-02-17 DIAGNOSIS — R0602 Shortness of breath: Secondary | ICD-10-CM | POA: Diagnosis present

## 2017-02-17 DIAGNOSIS — I483 Typical atrial flutter: Secondary | ICD-10-CM

## 2017-02-17 DIAGNOSIS — R609 Edema, unspecified: Secondary | ICD-10-CM

## 2017-02-17 DIAGNOSIS — L03119 Cellulitis of unspecified part of limb: Secondary | ICD-10-CM

## 2017-02-17 DIAGNOSIS — I82409 Acute embolism and thrombosis of unspecified deep veins of unspecified lower extremity: Secondary | ICD-10-CM | POA: Diagnosis present

## 2017-02-17 HISTORY — DX: Other pulmonary embolism without acute cor pulmonale: I26.99

## 2017-02-17 HISTORY — DX: Acute embolism and thrombosis of unspecified deep veins of unspecified lower extremity: I82.409

## 2017-02-17 LAB — CBC
HCT: 46.3 % (ref 40.0–52.0)
Hemoglobin: 15.6 g/dL (ref 13.0–18.0)
MCH: 29.9 pg (ref 26.0–34.0)
MCHC: 33.6 g/dL (ref 32.0–36.0)
MCV: 88.8 fL (ref 80.0–100.0)
Platelets: 168 10*3/uL (ref 150–440)
RBC: 5.22 MIL/uL (ref 4.40–5.90)
RDW: 13.6 % (ref 11.5–14.5)
WBC: 10.4 10*3/uL (ref 3.8–10.6)

## 2017-02-17 LAB — URINE DRUG SCREEN, QUALITATIVE (ARMC ONLY)
Amphetamines, Ur Screen: NOT DETECTED
Barbiturates, Ur Screen: NOT DETECTED
Benzodiazepine, Ur Scrn: NOT DETECTED
Cannabinoid 50 Ng, Ur ~~LOC~~: NOT DETECTED
Cocaine Metabolite,Ur ~~LOC~~: NOT DETECTED
MDMA (Ecstasy)Ur Screen: NOT DETECTED
Methadone Scn, Ur: NOT DETECTED
Opiate, Ur Screen: NOT DETECTED
Phencyclidine (PCP) Ur S: NOT DETECTED
Tricyclic, Ur Screen: NOT DETECTED

## 2017-02-17 LAB — URINALYSIS, COMPLETE (UACMP) WITH MICROSCOPIC
Bacteria, UA: NONE SEEN
Bilirubin Urine: NEGATIVE
Glucose, UA: NEGATIVE mg/dL
Hgb urine dipstick: NEGATIVE
Ketones, ur: NEGATIVE mg/dL
Leukocytes, UA: NEGATIVE
Nitrite: NEGATIVE
Protein, ur: NEGATIVE mg/dL
Specific Gravity, Urine: >1.046 — ABNORMAL HIGH (ref 1.005–1.030)
Squamous Epithelial / LPF: NONE SEEN
pH: 5 (ref 5.0–8.0)

## 2017-02-17 LAB — BASIC METABOLIC PANEL
Anion gap: 7 (ref 5–15)
BUN: 15 mg/dL (ref 6–20)
CO2: 25 mmol/L (ref 22–32)
Calcium: 9.4 mg/dL (ref 8.9–10.3)
Chloride: 106 mmol/L (ref 101–111)
Creatinine, Ser: 0.85 mg/dL (ref 0.61–1.24)
GFR calc Af Amer: >60 mL/min (ref >60)
GFR calc non Af Amer: >60 mL/min (ref >60)
Glucose, Bld: 100 mg/dL — ABNORMAL HIGH (ref 65–99)
Potassium: 3.9 mmol/L (ref 3.5–5.1)
Sodium: 138 mmol/L (ref 135–145)

## 2017-02-17 LAB — TROPONIN I
Troponin I: <0.03 ng/mL (ref <0.03)
Troponin I: <0.03 ng/mL (ref <0.03)
Troponin I: <0.03 ng/mL (ref <0.03)

## 2017-02-17 LAB — PROTIME-INR
INR: 0.97
Prothrombin Time: 12.9 seconds (ref 11.4–15.2)

## 2017-02-17 LAB — APTT: aPTT: 26 seconds (ref 24–36)

## 2017-02-17 LAB — CK: Total CK: 29 U/L — ABNORMAL LOW (ref 49–397)

## 2017-02-17 MED ORDER — ACETAMINOPHEN 650 MG RE SUPP: 650.0000 mg | Freq: Four times a day (QID) | RECTAL | Status: DC | PRN

## 2017-02-17 MED ORDER — ENOXAPARIN SODIUM 40 MG/0.4ML ~~LOC~~ SOLN
40.0000 mg | SUBCUTANEOUS | Status: DC
  Administered 2017-02-17: 40 mg via SUBCUTANEOUS
  Filled 2017-02-17: qty 0.4

## 2017-02-17 MED ORDER — ONDANSETRON HCL 4 MG PO TABS: 4.0000 mg | ORAL_TABLET | Freq: Four times a day (QID) | ORAL | Status: DC | PRN

## 2017-02-17 MED ORDER — SODIUM CHLORIDE 0.9% FLUSH
3.0000 mL | Freq: Two times a day (BID) | INTRAVENOUS | Status: DC
  Administered 2017-02-17 – 2017-02-25 (×16): 3 mL via INTRAVENOUS

## 2017-02-17 MED ORDER — ACETAMINOPHEN 325 MG PO TABS: 650.0000 mg | ORAL_TABLET | Freq: Four times a day (QID) | ORAL | Status: DC | PRN

## 2017-02-17 MED ORDER — IOPAMIDOL (ISOVUE-370) INJECTION 76%
75.0000 mL | Freq: Once | INTRAVENOUS | Status: AC | PRN
  Administered 2017-02-17: 75 mL via INTRAVENOUS

## 2017-02-17 MED ORDER — METOPROLOL TARTRATE 25 MG PO TABS
25.0000 mg | ORAL_TABLET | Freq: Two times a day (BID) | ORAL | Status: DC
  Administered 2017-02-17 – 2017-02-25 (×16): 25 mg via ORAL
  Filled 2017-02-17 (×17): qty 1

## 2017-02-17 MED ORDER — ONDANSETRON HCL 4 MG/2ML IJ SOLN: 4.0000 mg | Freq: Four times a day (QID) | INTRAMUSCULAR | Status: DC | PRN

## 2017-02-17 NOTE — ED Triage Notes (Signed)
Pt c/o lower back pain for the past 2 days, states while he is up walking BL LE go numb and it feels like he has to urine but doesn't.. Denies incontinence.. States he also has a hx of DVT.Marland Kitchen

## 2017-02-17 NOTE — ED Notes (Signed)
Pt encouraged to give urine sample, pt unable to void at this time

## 2017-02-17 NOTE — ED Notes (Signed)
Pt c/o dark urine, some discomfort in flank area.  Pt A/OX4, resp even and unlabored. Pt sts he feels slightly SOB and that he has bilateral leg swelling.  Pt sts he has not been taking anticoagulation medications as prescribed.  Pt sts that he came to ED for back pain and dark urine, but concerned about possible DVT's as he has had them in past.

## 2017-02-17 NOTE — ED Notes (Signed)
Pt returned from CT °

## 2017-02-17 NOTE — H&P (Signed)
[facility] [place] [facility]   PATIENT NAME: Henry Dunn    MR#:  [id number]  DATE OF BIRTH:  [birth year]  DATE OF ADMISSION:  02/17/2017  PRIMARY CARE PHYSICIAN: No PCP Per Patient   REQUESTING/REFERRING PHYSICIAN: Dr. Virgilio Frees  CHIEF COMPLAINT:   Chief Complaint  Patient presents with  . Back Pain  . Numbness    BL LE    HISTORY OF PRESENT ILLNESS:  Henry Dunn  is a 41 y.o. male with a known history of DVT, pulmonary embolism who presents to the hospital due to vague left lower back pain and also numbness of his left lower extremity and noted to have an extensive left lower extremity DVT. Patient has a previous history of DVT and is status post IVC filter is also supposed to be on Coumadin but has not taken it in years. His CT chest with contrast does not show any evidence of pulmonary embolism today. He is also complaining of some shortness of breath and exertion which has been progressively worse over the past few days. In the emergency room patient was noted to be in atrial fibrillation/flutter and therefore hospitalist services were contacted further treatment and evaluation. Patient denies any chest pain, nausea, vomiting, abdominal pain, palpitations, syncope or any other associated symptoms presently.  PAST MEDICAL HISTORY:   Past Medical History:  Diagnosis Date  . DVT (deep venous thrombosis) (HCC)   . Hx of blood clots    in left legs and lungs  . PE (pulmonary thromboembolism) (HCC)     PAST SURGICAL HISTORY:   Past Surgical History:  Procedure Laterality Date  . INSERTION OF VENA CAVA FILTER      SOCIAL HISTORY:   Social History  Substance Use Topics  . Smoking status: Current Every Day Smoker    Packs/day: 0.50    Types: Cigarettes  . Smokeless tobacco: Never Used  . Alcohol use Yes    FAMILY HISTORY:   Family History  Problem Relation Age of Onset  . Diabetes Neg Hx   . Hypertension Neg Hx     DRUG ALLERGIES:   No Known Allergies  REVIEW OF SYSTEMS:   Review of Systems  Constitutional: Negative for fever and weight loss.  HENT: Negative for congestion, nosebleeds and tinnitus.   Eyes: Negative for blurred vision, double vision and redness.  Respiratory: Positive for shortness of breath. Negative for cough and hemoptysis.   Cardiovascular: Negative for chest pain, orthopnea, leg swelling and PND.  Gastrointestinal: Negative for abdominal pain, diarrhea, melena, nausea and vomiting.  Genitourinary: Negative for dysuria, hematuria and urgency.  Musculoskeletal: Negative for falls and joint pain.  Neurological: Negative for dizziness, tingling, sensory change, focal weakness, seizures, weakness and headaches.  Endo/Heme/Allergies: Negative for polydipsia. Does not bruise/bleed easily.  Psychiatric/Behavioral: Negative for depression and memory loss. The patient is not nervous/anxious.     MEDICATIONS AT HOME:   Prior to Admission medications   Medication Sig Start Date End Date Taking? Authorizing Provider  acetaminophen (TYLENOL) 500 MG tablet Take 500 mg by mouth every 6 (six) hours as needed.   Yes Historical Provider, MD  clindamycin (CLEOCIN) 150 MG capsule 2 tablets q 6hours until finished Patient not taking: Reported on 02/17/2017 05/20/15   Tommi Rumps, PA-C  ibuprofen (ADVIL,MOTRIN) 800 MG tablet Take 1 tablet (800 mg total) by mouth 3 (three) times daily. Patient not taking: Reported on 02/17/2017 05/20/15   Tommi Rumps, PA-C  ketorolac (TORADOL) 10 MG  tablet Take 1 tablet (10 mg total) by mouth every 8 (eight) hours. Patient not taking: Reported on 02/17/2017 04/07/15   Charlesetta Ivory Menshew, PA-C  penicillin v potassium (VEETID) 500 MG tablet Take 1 tablet (500 mg total) by mouth 4 (four) times daily. Patient not taking: Reported on 02/17/2017 04/07/15   Charlesetta Ivory Menshew, PA-C      VITAL SIGNS:  Blood pressure 124/90, pulse 91, temperature 98.5 F (36.9 C), temperature  source Oral, resp. rate 17, height 5\' 8"  (1.727 m), weight 90.7 kg (200 lb), SpO2 100 %.  PHYSICAL EXAMINATION:  Physical Exam  GENERAL:  41 y.o.-year-old patient lying the bed in no acute distress.  EYES: Pupils equal, round, reactive to light and accommodation. No scleral icterus. Extraocular muscles intact.  HEENT: Head atraumatic, normocephalic. Oropharynx and nasopharynx clear. No oropharyngeal erythema, moist oral mucosa  NECK:  Supple, no jugular venous distention. No thyroid enlargement, no tenderness.  LUNGS: Normal breath sounds bilaterally, no wheezing, rales, rhonchi. No use of accessory muscles of respiration.  CARDIOVASCULAR: S1, S2 RRR. No murmurs, rubs, gallops, clicks.  ABDOMEN: Soft, nontender, nondistended. Bowel sounds present. No organomegaly or mass.  EXTREMITIES: No pedal edema, cyanosis, or clubbing. + 2 pedal & radial pulses b/l.   NEUROLOGIC: Cranial nerves II through XII are intact. No focal Motor or sensory deficits appreciated b/l PSYCHIATRIC: The patient is alert and oriented x 3.  SKIN: No obvious rash, lesion, or ulcer.   LABORATORY PANEL:   CBC  Recent Labs Lab 02/17/17 1233  WBC 10.4  HGB 15.6  HCT 46.3  PLT 168   ------------------------------------------------------------------------------------------------------------------  Chemistries   Recent Labs Lab 02/17/17 1233  NA 138  K 3.9  CL 106  CO2 25  GLUCOSE 100*  BUN 15  CREATININE 0.85  CALCIUM 9.4   ------------------------------------------------------------------------------------------------------------------  Cardiac Enzymes  Recent Labs Lab 02/17/17 1608  TROPONINI <0.03   ------------------------------------------------------------------------------------------------------------------  RADIOLOGY:  Ct Angio Chest Pe W And/or Wo Contrast  Result Date: 02/17/2017 CLINICAL DATA:  History of DVT and pulmonary embolism status post IVC filter. Bilateral lower extremity  numbness, exertional shortness of breath and cough. Bandlike pain in lower back since yesterday and dark urine. Intermittent left lower extremity swelling. EXAM: CT ANGIOGRAPHY CHEST WITH CONTRAST TECHNIQUE: Multidetector CT imaging of the chest was performed using the standard protocol during bolus administration of intravenous contrast. Multiplanar CT image reconstructions and MIPs were obtained to evaluate the vascular anatomy. CONTRAST:  75 cc Isovue 370 COMPARISON:  Chest CT dated 03/24/2011. FINDINGS: Cardiovascular: There is no pulmonary embolism identified within the main, lobar or segmental pulmonary arteries bilaterally. No thoracic aortic aneurysm or dissection. Heart size is upper normal. No pericardial effusion. Mediastinum/Nodes: No mass or enlarged lymph nodes seen within the mediastinum or perihilar regions. Esophagus appears normal. Trachea and central bronchi are unremarkable. Lungs/Pleura: Minimal bibasilar atelectasis versus scarring. Lungs otherwise clear. No pleural effusion or pneumothorax seen. Upper Abdomen: Limited images of the upper abdomen are unremarkable. Musculoskeletal: Mild degenerative spurring within the thoracic spine. No acute or suspicious osseous finding. Superficial soft tissues are unremarkable. Review of the MIP images confirms the above findings. IMPRESSION: 1. Overall, no acute findings. No pulmonary embolism identified. No aortic aneurysm or dissection. No evidence of pneumonia or pulmonary edema. 2. Additional chronic/incidental findings detailed above. Electronically Signed   By: Bary Richard M.D.   On: 02/17/2017 16:52   US Venous Img Lower Bilateral  Result Date: 02/17/2017 CLINICAL DATA:  Lower extremity swelling  EXAM: BILATERAL LOWER EXTREMITY VENOUS DUPLEX ULTRASOUND TECHNIQUE: Doppler venous assessment of the bilateral lower extremity deep venous system was performed, including characterization of spectral flow, compressibility, and phasicity. COMPARISON:   07/24/2015 FINDINGS: There is complete compressibility of the right common femoral, femoral, and popliteal veins. Doppler analysis demonstrates respiratory phasicity and augmentation of flow with calf compression. No obvious superficial vein or calf vein thrombosis. There is partially occlusive thrombus in the left common femoral vein. There is also thrombus throughout the femoral and popliteal veins. In the upper femoral vein, it is occlusive. In the distal femoral vein and popliteal vein, there is some re- cannulization through the thrombus. Overall, the thrombus burden has increased since the prior study. On the prior study, nonocclusive thrombus was seen in the femoral and popliteal veins. IMPRESSION: No evidence of right lower extremity DVT The study is positive for DVT in the left lower extremity throughout the common femoral, femoral, and popliteal veins. It is occlusive in the upper femoral vein but partially occlusive elsewhere as described above. Overall, thrombus burden has increased since the prior study. Acute superimposed upon chronic thrombus cannot be excluded. Electronically Signed   By: Jolaine Click M.D.   On: 02/17/2017 15:00     IMPRESSION AND PLAN:   41 y.o. male with a known history of DVT, pulmonary embolism who presents to the hospital due to vague left lower back pain and also numbness of his left lower extremity and noted to have an extensive left lower extremity DVT and also incidentally noted to be in a. Fib/flutter with exertional shortness of breath.   1.  Shortness of breath - etiology unclear presently. Patient is although not hypoxic. He does have an extensive DVT which is chronic but is status post IVC and his CT angios negative for pulmonary embolism. -Questionable of this is related to underlying arrhythmia is noted. We'll observe on telemetry, get a two-dimensional echocardiogram, get a cardiology consult.  2. New onset atrial fibrillation/flutter-observe on telemetry,  cycle cardiac markers. -Check two-dimensional echocardiogram, cardiology consult. We'll start patient on low-dose metoprolol.  3. History of extensive DVT/PE-patient still has an extensive DVT in his left lower extremity. He status post IVC filter.  - not complaint with anti-coagulation. ?? Consider a DOAC (vs) Coumadin.  Discussed w/ Case Management for med managed.       All the records are reviewed and case discussed with ED provider. Management plans discussed with the patient, family and they are in agreement.  CODE STATUS: Full code  TOTAL TIME TAKING CARE OF THIS PATIENT: 40 minutes.    Houston Siren M.D on 02/17/2017 at 6:29 PM  Between 7am to 6pm - Pager - [phone]  After 6pm go to [email or web address] - password [email or web address]  [facility]  Office  [phone]  CC: Primary care physician; No PCP Per Patient

## 2017-02-17 NOTE — ED Provider Notes (Addendum)
[facility] Emergency Department Provider Note  ____________________________________________  Time seen: Approximately 3:46 PM  I have reviewed the triage vital signs and the nursing notes.   HISTORY  Chief Complaint Back Pain and Numbness (BL LE)    HPI Henry Dunn is a 41 y.o. 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 The patient's primary reason for coming to the emergency department today is that he noticed that he was having of "bandlike" pain in the lower back since yesterday. He does not feel it when he sits down but it is exacerbated when he walks around. He has not tried any medication for this. He also noted that his urine was dark, and that he had the sensation of having to urinate with suprapubic pressure and fullness but did not have to be. He has been urinating otherwise normally. He has not had any fever or chills, nausea vomiting, or diarrhea. In addition, the patient notes that over the past several weeks she has had intermittent left lower extremity swelling, and exertional shortness of breath. No lightheadedness or syncope, no chest pain.   Past Medical History:  Diagnosis Date  . DVT (deep venous thrombosis) (HCC)   . Hx of blood clots    in left legs and lungs  . PE (pulmonary thromboembolism) (HCC)     There are no active problems to display for this patient.   Past Surgical History:  Procedure Laterality Date  . INSERTION OF VENA CAVA FILTER      Current Outpatient Rx  . Order #: [id number] Class: Print  . Order #: [id number] Class: Print  . Order #: [id number] Class: Print  . Order #: [id number] Class: Print    Allergies Patient has no known allergies.  No family history on file.  Social History Social History  Substance Use  Topics  . Smoking status: Current Every Day Smoker    Packs/day: 0.50    Types: Cigarettes  . Smokeless tobacco: Never Used  . Alcohol use Yes    Review of Systems Constitutional: No fever/chills.Lightheadedness or syncope. Eyes: No visual changes. ENT: No sore throat. Positive congestion now rhinorrhea. Cardiovascular: Denies chest pain. Denies palpitations. Respiratory: Positive exertional shortness of breath.  Positive cough. Gastrointestinal: No abdominal pain.  No nausea, no vomiting.  No diarrhea.  No constipation. Genitourinary: Negative for dysuria. Positive for suprapubic pressure. Positive for dark urine. Musculoskeletal: Positive for diffuse low back pain. Skin: Negative for rash. Neurological: Negative for headaches. No focal numbness, tingling or weakness.   10-point ROS otherwise negative.  ____________________________________________   PHYSICAL EXAM:  VITAL SIGNS: ED Triage Vitals [02/17/17 1220]  Enc Vitals Group     BP 124/90     Pulse Rate 91     Resp 18     Temp 98.5 F (36.9 C)     Temp Source Oral     SpO2 96 %     Weight 200 lb (90.7 kg)     Height 5\' 8"  (1.727 m)     Head Circumference      Peak Flow      Pain Score 0     Pain Loc      Pain Edu?      Excl. in GC?     Constitutional: Alert and oriented. Well appearing and in no acute distress. Answers questions  appropriately. Eyes: Conjunctivae are normal.  EOMI. No scleral icterus. Head: Atraumatic. Nose: No congestion/rhinnorhea. Mouth/Throat: Mucous membranes are moist.  Neck: No stridor.  Supple. No JVD. No midline C-spine tenderness, step-offs or deformities.  Cardiovascular: Normal rate, irregular rhythm with intermittent regular rhythm that on the monitor looks like sinus.. No murmurs, rubs or gallops.  Respiratory: Normal respiratory effort.  No accessory muscle use or retractions. Lungs CTAB.  No wheezes, rales or ronchi. Gastrointestinal: Soft, nontender and nondistended.  No  guarding or rebound.  No peritoneal signs. Musculoskeletal: Positive isolated left lower extremity edema with minimal pitting around the ankle, and palpable cords in the left posterior calf. No midline T or L-spine tenderness to palpation, step-offs or deformities. I'm unable to interpret as the patient's low back pain with palpation but he does report that he feels that when I walk in the room. GU: Normal-appearing external genitalia without lesions. Normal lie of the testicles without palpable masses or tenderness to palpation in the testicles. No evidence of inguinal hernias bilaterally.  Neurologic:  A&Ox3.  Speech is clear.  Face and smile are symmetric.  EOMI.  normal bilateral patellar reflexes. Normal sensation to light touch in the bilateral lower extremities. 5 out of 5 quad and hamstring, hip flexor, dorsiflexion and plantarflexion motor strength. Normal gait without ataxia. Normal rectal tone. Skin:  Skin is warm, dry and intact. No rash noted. Psychiatric: Mood and affect are normal. Speech and behavior are normal.  Normal judgement.  ____________________________________________   LABS (all labs ordered are listed, but only abnormal results are displayed)  Labs Reviewed  BASIC METABOLIC PANEL - Abnormal; Notable for the following:       Result Value   Glucose, Bld 100 (*)    All other components within normal limits  CBC  TROPONIN I  PROTIME-INR  APTT  URINALYSIS, COMPLETE (UACMP) WITH MICROSCOPIC  URINE DRUG SCREEN, QUALITATIVE ([facility] ONLY)  CK   ____________________________________________  EKG  ED ECG REPORT I, Rockne Menghini, the attending physician, personally viewed and interpreted this ECG.  ED ECG REPORT I, Rockne Menghini, the attending physician, personally viewed and interpreted this ECG.   Date: 02/17/2017  EKG Time: 1228  Rate: 117  Rhythm: A. fib with rapid ventricular rate  Axis: normal  Intervals:none  ST&T Change: no STEMI    Date:  02/17/2017  EKG Time: 1613  Rate: 94  Rhythm: Atrial fibrillation  Axis: normal  Intervals:none  ST&T Change: No STEMI  There are portions of this patient's EKG that look like they may be atrial flutter, and on the monitor, he intermittently has sinus rhythms I have repeated the EKG:  ED ECG REPORT I, Rockne Menghini, the attending physician, personally viewed and interpreted this ECG.   Date: 02/17/2017  EKG Time: 1618  Rate: 71  Rhythm:  Sinus rhythm with sinus arrhythmia  Axis: normal  Intervals:none  ST&T Change: No STEMI   ____________________________________________  RADIOLOGY  US Venous Img Lower Bilateral  Result Date: 02/17/2017 CLINICAL DATA:  Lower extremity swelling EXAM: BILATERAL LOWER EXTREMITY VENOUS DUPLEX ULTRASOUND TECHNIQUE: Doppler venous assessment of the bilateral lower extremity deep venous system was performed, including characterization of spectral flow, compressibility, and phasicity. COMPARISON:  07/24/2015 FINDINGS: There is complete compressibility of the right common femoral, femoral, and popliteal veins. Doppler analysis demonstrates respiratory phasicity and augmentation of flow with calf compression. No obvious superficial vein or calf vein thrombosis. There is partially occlusive thrombus in the left common femoral vein. There is also thrombus  throughout the femoral and popliteal veins. In the upper femoral vein, it is occlusive. In the distal femoral vein and popliteal vein, there is some re- cannulization through the thrombus. Overall, the thrombus burden has increased since the prior study. On the prior study, nonocclusive thrombus was seen in the femoral and popliteal veins. IMPRESSION: No evidence of right lower extremity DVT The study is positive for DVT in the left lower extremity throughout the common femoral, femoral, and popliteal veins. It is occlusive in the upper femoral vein but partially occlusive elsewhere as described above.  Overall, thrombus burden has increased since the prior study. Acute superimposed upon chronic thrombus cannot be excluded. Electronically Signed   By: Jolaine Click M.D.   On: 02/17/2017 15:00    ____________________________________________   PROCEDURES  Procedure(s) performed: None  Procedures  Critical Care performed: Yes, see critical care note(s) ____________________________________________   INITIAL IMPRESSION / ASSESSMENT AND PLAN / ED COURSE  Pertinent labs & imaging results that were available during my care of the patient were reviewed by me and considered in my medical decision making (see chart for details).  41 y.o. male with a history of blood clots off anticoagulation but does have an IVC filter presenting for low back pain and urinary symptoms. In terms of the patient's low back pain, he does not have any midline tenderness, denies IV drug abuse, has not been having fevers, and has no focal neurologic deficits on my examination, including rectal tone being normal. I do not see any signs or symptoms that would be consistent with spinal cord compression or cauda equina syndrome. He may have some strain of the musculature, and we will also evaluate his urine for UTI or pyelonephritis but this is less likely given no fevers or other systemic illness. The patient has deferred any medications for his back pain.   On arrival to the emergency department, the patient has left lower extremity swelling with a new onset atrial fibrillation with rapid ventricular rate. I'm concerned about PE as the patient does have a positive ultrasound study in the left lower extremity for DVT. This would be unusual, given that he has not IVC filter. We will also look for other causes of intermittent atrial fibrillation, as he is having runs of normal sinus rhythm as well. The patient will require admission for his cardiac arrhythmia today. I'm awaiting the results of his CT angiogram to evaluate for  PE.  ----------------------------------------- 5:23 PM on 02/17/2017 -----------------------------------------  The patient does not have any evidence of PE on his CT angiogram. I'll plan to admit him for his cardiac arrhythmia that is symptomatic. ____________________________________________  FINAL CLINICAL IMPRESSION(S) / ED DIAGNOSES  Final diagnoses:  Atrial fibrillation with RVR (HCC)  Acute deep vein thrombosis (DVT) of distal end of left lower extremity (HCC)  Acute bilateral low back pain, with sciatica presence unspecified         NEW MEDICATIONS STARTED DURING THIS VISIT:  New Prescriptions   No medications on file       Rockne Menghini, MD 02/17/17 1635    Rockne Menghini, MD 02/17/17 1723

## 2017-02-17 NOTE — ED Notes (Signed)
Patient transported to CT 

## 2017-02-17 NOTE — ED Notes (Signed)
Pts family directed to room

## 2017-02-17 NOTE — ED Notes (Signed)
Pt will have cardiac rhythm changes, from afib/aflutter to NSR.  EKG's of both obtained and given to MD Sharma Covert

## 2017-02-18 ENCOUNTER — Encounter: Payer: Self-pay | Admitting: Physician Assistant

## 2017-02-18 ENCOUNTER — Observation Stay (HOSPITAL_BASED_OUTPATIENT_CLINIC_OR_DEPARTMENT_OTHER)
Admit: 2017-02-18 | Discharge: 2017-02-18 | Disposition: A | Discharge status: Home / Self Care | Payer: Self-pay | Attending: Specialist | Admitting: Specialist

## 2017-02-18 DIAGNOSIS — I82509 Chronic embolism and thrombosis of unspecified deep veins of unspecified lower extremity: Secondary | ICD-10-CM

## 2017-02-18 DIAGNOSIS — I82409 Acute embolism and thrombosis of unspecified deep veins of unspecified lower extremity: Secondary | ICD-10-CM | POA: Diagnosis present

## 2017-02-18 DIAGNOSIS — Z720 Tobacco use: Secondary | ICD-10-CM

## 2017-02-18 DIAGNOSIS — I4892 Unspecified atrial flutter: Secondary | ICD-10-CM

## 2017-02-18 DIAGNOSIS — I483 Typical atrial flutter: Secondary | ICD-10-CM

## 2017-02-18 DIAGNOSIS — F17200 Nicotine dependence, unspecified, uncomplicated: Secondary | ICD-10-CM

## 2017-02-18 LAB — ECHOCARDIOGRAM COMPLETE
Height: 68 in
Weight: 3190.4 oz

## 2017-02-18 LAB — MAGNESIUM: Magnesium: 1.7 mg/dL (ref 1.7–2.4)

## 2017-02-18 LAB — TSH: TSH: 3.702 u[IU]/mL (ref 0.350–4.500)

## 2017-02-18 LAB — TROPONIN I
Troponin I: <0.03 ng/mL (ref <0.03)
Troponin I: <0.03 ng/mL (ref <0.03)

## 2017-02-18 MED ORDER — ENOXAPARIN SODIUM 100 MG/ML ~~LOC~~ SOLN
1.0000 mg/kg | Freq: Two times a day (BID) | SUBCUTANEOUS | Status: DC
  Administered 2017-02-18 – 2017-02-25 (×15): 90 mg via SUBCUTANEOUS
  Filled 2017-02-18 (×7): qty 1
  Filled 2017-02-18: qty 2
  Filled 2017-02-18 (×8): qty 1

## 2017-02-18 MED ORDER — WARFARIN - PHARMACIST DOSING INPATIENT
Freq: Every day | Status: DC
  Administered 2017-02-18 – 2017-02-23 (×3)

## 2017-02-18 MED ORDER — POTASSIUM CHLORIDE CRYS ER 10 MEQ PO TBCR
10.0000 meq | EXTENDED_RELEASE_TABLET | Freq: Once | ORAL | Status: AC
  Administered 2017-02-18: 10 meq via ORAL
  Filled 2017-02-18: qty 1

## 2017-02-18 MED ORDER — WARFARIN SODIUM 5 MG PO TABS
5.0000 mg | ORAL_TABLET | Freq: Once | ORAL | Status: AC
  Administered 2017-02-18: 5 mg via ORAL
  Filled 2017-02-18: qty 1

## 2017-02-18 NOTE — Progress Notes (Signed)
ANTICOAGULATION CONSULT NOTE - Initial Consult  Pharmacy Consult for warfarin Indication: DVT  No Known Allergies  Patient Measurements: Height: 5\' 8"  (172.7 cm) Weight: 199 lb 6.4 oz (90.4 kg) IBW/kg (Calculated) : 68.4  Vital Signs: Temp: 97.5 F (36.4 C) (03/22 0813) Temp Source: Oral (03/22 0813) BP: 112/66 (03/22 0813) Pulse Rate: 70 (03/22 0813)  Labs:  Recent Labs  02/17/17 1233 02/17/17 1608 02/17/17 2222 02/18/17 0312 02/18/17 0747  HGB 15.6  --   --   --   --   HCT 46.3  --   --   --   --   PLT 168  --   --   --   --   APTT  --  26  --   --   --   LABPROT  --  12.9  --   --   --   INR  --  0.97  --   --   --   CREATININE 0.85  --   --   --   --   CKTOTAL 29*  --   --   --   --   TROPONINI <0.03 <0.03 <0.03 <0.03 <0.03    Estimated Creatinine Clearance: 126.1 mL/min (by C-G formula based on SCr of 0.85 mg/dL).   Medical History: Past Medical History:  Diagnosis Date  . DVT (deep venous thrombosis) (HCC)    a. In setting of dislocated hip, status post IVC filter, previously on Coumadin, noncompliant over the past 12 months  . Hx of blood clots    a. Patient denies any family history of clotting disorder, patient denies prior hypercoagulable workup  . PE (pulmonary thromboembolism) (HCC)     Medications:  Scheduled:  . enoxaparin (LOVENOX) injection  1 mg/kg Subcutaneous Q12H  . metoprolol tartrate  25 mg Oral BID  . potassium chloride  10 mEq Oral Once  . sodium chloride flush  3 mL Intravenous Q12H  . warfarin  5 mg Oral ONCE-1800  . Warfarin - Pharmacist Dosing Inpatient   Does not apply q1800    Assessment: Pharmacy consulted to dose and monitor warfarin in this 41 year old male diagnosed with extensive DVT on admission. Patient has a history of DVT and PE and is s/p IVC filter placement and was prescribed warfarin outpatient. However, patient reports noncompliance and that he stopped taking warfarin approximately one year ago.  This will be  considered a new start warfarin.  Goal of Therapy:  INR 2-3 Monitor platelets by anticoagulation protocol: Yes   Plan:  Will start with warfarin 5 mg PO daily (will take at least 72 hours to see the effects of warfarin initiation on INR).  Patient is being bridged with therapeutic enoxaparin (1 mg/kg subcutaneously q12h)  Will check INR daily and CBC at least every 3 days per protocol.  Cindi Carbon, PharmD, BCPS Clinical Pharmacist 02/18/2017,10:12 AM

## 2017-02-18 NOTE — Care Management (Addendum)
Provided patient with applications for [facility].  Works full time but says [employer] is too expensive and the deductibles are very high. discussed sliding scale clinics. Provided list.

## 2017-02-18 NOTE — Progress Notes (Signed)
[employer] [facility] [place] [facility]   PATIENT NAME: Henry Dunn    MR#:  [id number]  DATE OF BIRTH:  1976/02/12  SUBJECTIVE:  CHIEF COMPLAINT:   Chief Complaint  Patient presents with  . Back Pain  . Numbness    BL LE  Feels much better. REVIEW OF SYSTEMS:  Review of Systems  Constitutional: Negative for chills, fever and weight loss.  HENT: Negative for nosebleeds and sore throat.   Eyes: Negative for blurred vision.  Respiratory: Positive for shortness of breath. Negative for cough and wheezing.   Cardiovascular: Negative for chest pain, orthopnea, leg swelling and PND.  Gastrointestinal: Negative for abdominal pain, constipation, diarrhea, heartburn, nausea and vomiting.  Genitourinary: Negative for dysuria and urgency.  Musculoskeletal: Negative for back pain.  Skin: Negative for rash.  Neurological: Negative for dizziness, speech change, focal weakness and headaches.  Endo/Heme/Allergies: Does not bruise/bleed easily.  Psychiatric/Behavioral: Negative for depression.    DRUG ALLERGIES:  No Known Allergies VITALS:  Blood pressure 103/60, pulse 70, temperature 98 F (36.7 C), temperature source Oral, resp. rate 18, height 5\' 8"  (1.727 m), weight 90.4 kg (199 lb 6.4 oz), SpO2 98 %. PHYSICAL EXAMINATION:  Physical Exam  Constitutional: He is oriented to person, place, and time and well-developed, well-nourished, and in no distress.  HENT:  Head: Normocephalic and atraumatic.  Eyes: Conjunctivae and EOM are normal. Pupils are equal, round, and reactive to light.  Neck: Normal range of motion. Neck supple. No tracheal deviation present. No thyromegaly present.  Cardiovascular: Normal rate, regular rhythm and normal heart sounds.   Pulmonary/Chest: Effort normal and breath sounds normal. No respiratory distress. He has no wheezes. He exhibits no tenderness.  Abdominal: Soft. Bowel sounds are normal. He exhibits no distension. There is no tenderness.    Musculoskeletal: Normal range of motion.  Neurological: He is alert and oriented to person, place, and time. No cranial nerve deficit.  Skin: Skin is warm and dry. No rash noted.  Psychiatric: Mood and affect normal.   LABORATORY PANEL:  Male CBC  Recent Labs Lab 02/17/17 1233  WBC 10.4  HGB 15.6  HCT 46.3  PLT 168   ------------------------------------------------------------------------------------------------------------------ Chemistries   Recent Labs Lab 02/17/17 1233 02/18/17 0747  NA 138  --   K 3.9  --   CL 106  --   CO2 25  --   GLUCOSE 100*  --   BUN 15  --   CREATININE 0.85  --   CALCIUM 9.4  --   MG  --  1.7   RADIOLOGY:  Ct Angio Chest Pe W And/or Wo Contrast  Result Date: 02/17/2017 CLINICAL DATA:  History of DVT and pulmonary embolism status post IVC filter. Bilateral lower extremity numbness, exertional shortness of breath and cough. Bandlike pain in lower back since yesterday and dark urine. Intermittent left lower extremity swelling. EXAM: CT ANGIOGRAPHY CHEST WITH CONTRAST TECHNIQUE: Multidetector CT imaging of the chest was performed using the standard protocol during bolus administration of intravenous contrast. Multiplanar CT image reconstructions and MIPs were obtained to evaluate the vascular anatomy. CONTRAST:  75 cc Isovue 370 COMPARISON:  Chest CT dated 03/24/2011. FINDINGS: Cardiovascular: There is no pulmonary embolism identified within the main, lobar or segmental pulmonary arteries bilaterally. No thoracic aortic aneurysm or dissection. Heart size is upper normal. No pericardial effusion. Mediastinum/Nodes: No mass or enlarged lymph nodes seen within the mediastinum or perihilar regions. Esophagus appears normal. Trachea and central bronchi are unremarkable. Lungs/Pleura:  Minimal bibasilar atelectasis versus scarring. Lungs otherwise clear. No pleural effusion or pneumothorax seen. Upper Abdomen: Limited images of the upper abdomen are  unremarkable. Musculoskeletal: Mild degenerative spurring within the thoracic spine. No acute or suspicious osseous finding. Superficial soft tissues are unremarkable. Review of the MIP images confirms the above findings. IMPRESSION: 1. Overall, no acute findings. No pulmonary embolism identified. No aortic aneurysm or dissection. No evidence of pneumonia or pulmonary edema. 2. Additional chronic/incidental findings detailed above. Electronically Signed   By: Bary Richard M.D.   On: 02/17/2017 16:52   ASSESSMENT AND PLAN:  41 y.o. male with a known history of DVT, pulmonary embolism who presents to the hospital due to vague left lower back pain and also numbness of his left lower extremity and noted to have an extensive left lower extremity DVT and also incidentally noted to be in a. Fib/flutter with exertional shortness of breath.   * New onset atrial fibrillation/flutter: Now converted to normal sinus rhythm.  Echo within normal limit. - Started on Coumadin with Lovenox bridging. - Appreciate cardiology input. - Negative serial troponins. - Negative.  CT scan of the chest for PE.  * History of extensive DVT-patient still has an extensive DVT in his left lower extremity. He status post IVC filter.  - started on Coumadin and Lovenox.  * Shortness of breath - Likely due to atrial fibrillation/flutter  All the records are reviewed and case discussed with Care Management/Social Worker. Management plans discussed with the patient, family and they are in agreement.  CODE STATUS: Full Code  TOTAL TIME TAKING CARE OF THIS PATIENT: 25 minutes.   More than 50% of the time was spent in counseling/coordination of care: YES  POSSIBLE D/C IN 2-3 DAYS, DEPENDING ON CLINICAL CONDITION.  He will need INR between 2-3 and due to no Payor Source and noncompliance.  He will need to stay in the hospital to get fully anticoagulated.   Henry Dunn M.D on 02/18/2017 at 3:02 PM  Between 7am to 6pm - Pager -  [phone]  After 6pm go to [email or web address] - Scientist, research (life sciences) [place] [facility]  Office  [phone]  CC: Primary care physician; No PCP Per Patient  Note: This dictation was prepared with Dragon dictation along with smaller phrase technology. Any transcriptional errors that result from this process are unintentional.

## 2017-02-18 NOTE — Consult Note (Signed)
Cardiology Consultation Note  Patient ID: Henry Dunn, MRN: [id number], DOB/AGE: [birth year] 41 y.o. Admit date: 02/17/2017   Date of Consult: 02/18/2017 Primary Physician: No PCP Per Patient Primary Cardiologist: New to [facility] - consult by Kirke Corin Requesting Physician: Dr. Sherryll Burger, MD  Chief Complaint: SOB/palpitations  Reason for Consult: New onset atrial flutter with RVR  HPI: 41 y.o. male with h/o left lower extremity DVT with PE presumed to be in the setting of dislocated left hip in 2006 secondary to mechanical fall off porch while wrestling during a football game, long history of palpitations without prior workup, and ongoing tobacco abuse who presented to [facility] with left back pain as well as shortness of breath. He was noted to be in new onset atrial flutter with RVR with variable AV block. Cardiology asked to further evaluate.  Patient without any previously known cardiac history. Patient never seen a cardiologist before. He reports to me a long history of palpitations for which she has previously been told by prior PCPs that he has an irregular heart rate, though this is never been worked up. Patient reports back in 2006 he was at a football party, wrestling with some friends on a porch when the railing broke causing him to fall off and dislocated his left hip. It was in the setting he was diagnosed with left lower extremity DVT, located by pulmonary embolism. IVC filter was placed at that time. He was continued on Coumadin up until approximately 12 months ago when he lost his insurance and has been without anticoagulation since.  Patient has noted for the past 2-3 days left thoracic back pain with association of shortness of breath and increase in palpitations. He denies any fevers, chills, myalgias, recent illnesses, nausea/vomiting, or diarrhea. No chest pain. Upon his arrival to [facility] was noted to be in new onset atrial flutter with RVR with variable AV block with heart rates in the 1 teens to 120s  bpm. He underwent CTA chest PE protocol that was negative for PE. Lower extremity ultrasound did show extensive chronic left lower extremity DVT. Labs showed negative troponin 3. Urine drug screen negative. Serum creatinine 0.5. Potassium 3.9. CBC unremarkable. CK 29. Admission blood pressure 124/90 that has since decreased to the 80s to 90s systolic. Afebrile. Oxygen saturations 96% on room air. Chest x-ray not done. Initial EKG showed atrial flutter with RVR with variable AV block, 117 bpm, nonspecific ST-T changes. Patient was placed on prophylactic dose of Lovenox upon admission as well as Lopressor 25 mg twice a day. Over night at approximately 12:15 AM patient spontaneously converted to sinus rhythm with frequent PACs with heart rates in the 70s bpm. He has remained in sinus rhythm since then. Patient now notes decrease in palpitations and states his heart feels "normal." Echo, TSH, magnesium are pending. No complaints this morning.   Past Medical History:  Diagnosis Date  . DVT (deep venous thrombosis) (HCC)    a. In setting of dislocated hip, status post IVC filter, previously on Coumadin, noncompliant over the past 12 months  . Hx of blood clots    a. Patient denies any family history of clotting disorder, patient denies prior hypercoagulable workup  . PE (pulmonary thromboembolism) (HCC)       Most Recent Cardiac Studies: none   Surgical History:  Past Surgical History:  Procedure Laterality Date  . INSERTION OF VENA CAVA FILTER       Home Meds: Prior to Admission medications   Medication Sig Start Date End  Date Taking? Authorizing Provider  acetaminophen (TYLENOL) 500 MG tablet Take 500 mg by mouth every 6 (six) hours as needed.   Yes Historical Provider, MD  clindamycin (CLEOCIN) 150 MG capsule 2 tablets q 6hours until finished Patient not taking: Reported on 02/17/2017 05/20/15   Tommi Rumps, PA-C  ibuprofen (ADVIL,MOTRIN) 800 MG tablet Take 1 tablet (800 mg total) by  mouth 3 (three) times daily. Patient not taking: Reported on 02/17/2017 05/20/15   Tommi Rumps, PA-C  ketorolac (TORADOL) 10 MG tablet Take 1 tablet (10 mg total) by mouth every 8 (eight) hours. Patient not taking: Reported on 02/17/2017 04/07/15   Charlesetta Ivory Menshew, PA-C  penicillin v potassium (VEETID) 500 MG tablet Take 1 tablet (500 mg total) by mouth 4 (four) times daily. Patient not taking: Reported on 02/17/2017 04/07/15   Lissa Hoard, PA-C    Inpatient Medications:  . enoxaparin (LOVENOX) injection  40 mg Subcutaneous Q24H  . metoprolol tartrate  25 mg Oral BID  . sodium chloride flush  3 mL Intravenous Q12H     Allergies: No Known Allergies  Social History   Social History  . Marital status: Single    Spouse name: N/A  . Number of children: N/A  . Years of education: N/A   Occupational History  . Not on file.   Social History Main Topics  . Smoking status: Current Every Day Smoker    Packs/day: 0.50    Types: Cigarettes  . Smokeless tobacco: Never Used  . Alcohol use Yes  . Drug use: Unknown  . Sexual activity: Not on file   Other Topics Concern  . Not on file   Social History Narrative  . No narrative on file     Family History  Problem Relation Age of Onset  . Diabetes Neg Hx   . Hypertension Neg Hx      Review of Systems: Review of Systems  Constitutional: Negative for chills, diaphoresis, fever, malaise/fatigue and weight loss.  HENT: Negative for congestion.   Eyes: Negative for discharge and redness.  Respiratory: Positive for shortness of breath. Negative for cough, hemoptysis, sputum production and wheezing.   Cardiovascular: Positive for palpitations. Negative for chest pain, orthopnea, claudication, leg swelling and PND.  Gastrointestinal: Negative for abdominal pain, blood in stool, heartburn, melena, nausea and vomiting.  Genitourinary: Negative for hematuria.  Musculoskeletal: Negative for falls and myalgias.  Skin:  Negative for rash.  Neurological: Negative for dizziness, tingling, tremors, sensory change, speech change, focal weakness, loss of consciousness and weakness.  Endo/Heme/Allergies: Does not bruise/bleed easily.  Psychiatric/Behavioral: Negative for substance abuse. The patient is not nervous/anxious.   All other systems reviewed and are negative.   Labs:  Recent Labs  02/17/17 1233 02/17/17 1608 02/17/17 2222 02/18/17 0312  CKTOTAL 29*  --   --   --   TROPONINI <0.03 <0.03 <0.03 <0.03   Lab Results  Component Value Date   WBC 10.4 02/17/2017   HGB 15.6 02/17/2017   HCT 46.3 02/17/2017   MCV 88.8 02/17/2017   PLT 168 02/17/2017     Recent Labs Lab 02/17/17 1233  NA 138  K 3.9  CL 106  CO2 25  BUN 15  CREATININE 0.85  CALCIUM 9.4  GLUCOSE 100*   No results found for: CHOL, HDL, LDLCALC, TRIG No results found for: DDIMER  Radiology/Studies:  Ct Angio Chest Pe W And/or Wo Contrast  Result Date: 02/17/2017 CLINICAL DATA:  History of DVT and  pulmonary embolism status post IVC filter. Bilateral lower extremity numbness, exertional shortness of breath and cough. Bandlike pain in lower back since yesterday and dark urine. Intermittent left lower extremity swelling. EXAM: CT ANGIOGRAPHY CHEST WITH CONTRAST TECHNIQUE: Multidetector CT imaging of the chest was performed using the standard protocol during bolus administration of intravenous contrast. Multiplanar CT image reconstructions and MIPs were obtained to evaluate the vascular anatomy. CONTRAST:  75 cc Isovue 370 COMPARISON:  Chest CT dated 03/24/2011. FINDINGS: Cardiovascular: There is no pulmonary embolism identified within the main, lobar or segmental pulmonary arteries bilaterally. No thoracic aortic aneurysm or dissection. Heart size is upper normal. No pericardial effusion. Mediastinum/Nodes: No mass or enlarged lymph nodes seen within the mediastinum or perihilar regions. Esophagus appears normal. Trachea and central  bronchi are unremarkable. Lungs/Pleura: Minimal bibasilar atelectasis versus scarring. Lungs otherwise clear. No pleural effusion or pneumothorax seen. Upper Abdomen: Limited images of the upper abdomen are unremarkable. Musculoskeletal: Mild degenerative spurring within the thoracic spine. No acute or suspicious osseous finding. Superficial soft tissues are unremarkable. Review of the MIP images confirms the above findings. IMPRESSION: 1. Overall, no acute findings. No pulmonary embolism identified. No aortic aneurysm or dissection. No evidence of pneumonia or pulmonary edema. 2. Additional chronic/incidental findings detailed above. Electronically Signed   By: Bary Richard M.D.   On: 02/17/2017 16:52   US Venous Img Lower Bilateral  Result Date: 02/17/2017 CLINICAL DATA:  Lower extremity swelling EXAM: BILATERAL LOWER EXTREMITY VENOUS DUPLEX ULTRASOUND TECHNIQUE: Doppler venous assessment of the bilateral lower extremity deep venous system was performed, including characterization of spectral flow, compressibility, and phasicity. COMPARISON:  07/24/2015 FINDINGS: There is complete compressibility of the right common femoral, femoral, and popliteal veins. Doppler analysis demonstrates respiratory phasicity and augmentation of flow with calf compression. No obvious superficial vein or calf vein thrombosis. There is partially occlusive thrombus in the left common femoral vein. There is also thrombus throughout the femoral and popliteal veins. In the upper femoral vein, it is occlusive. In the distal femoral vein and popliteal vein, there is some re- cannulization through the thrombus. Overall, the thrombus burden has increased since the prior study. On the prior study, nonocclusive thrombus was seen in the femoral and popliteal veins. IMPRESSION: No evidence of right lower extremity DVT The study is positive for DVT in the left lower extremity throughout the common femoral, femoral, and popliteal veins. It is  occlusive in the upper femoral vein but partially occlusive elsewhere as described above. Overall, thrombus burden has increased since the prior study. Acute superimposed upon chronic thrombus cannot be excluded. Electronically Signed   By: Jolaine Click M.D.   On: 02/17/2017 15:00    EKG: Interpreted by me showed: Atrial flutter with RVR with variable AV block, 117 bpm, nonspecific st/t changes. Follow-up EKG sinus rhythm, 71 bpm, sinus pause, no acute ST-T changes Telemetry: Interpreted by me showed: Atrial flutter with variable AV bock with heart rates ranging to the 120s bpm, converted to sinus rhythm with frequent PACs at 12:15 AM and has remained in sinus since with heart rates in the 70s bpm  Weights: [facility] Weights   02/17/17 1220 02/17/17 2203  Weight: 200 lb (90.7 kg) 199 lb 6.4 oz (90.4 kg)     Physical Exam: Blood pressure (!) 98/52, pulse 72, temperature 98 F (36.7 C), resp. rate 18, height 5\' 8"  (1.727 m), weight 199 lb 6.4 oz (90.4 kg), SpO2 97 %. Body mass index is 30.32 kg/m. General: Well developed, well nourished,  in no acute distress. Head: Normocephalic, atraumatic, sclera non-icteric, no xanthomas, nares are without discharge.  Neck: Negative for carotid bruits. JVD not elevated. Lungs: Clear bilaterally to auscultation without wheezes, rales, or rhonchi. Breathing is unlabored. Heart: Irregular with S1 S2. No murmurs, rubs, or gallops appreciated. Abdomen: Soft, non-tender, non-distended with normoactive bowel sounds. No hepatomegaly. No rebound/guarding. No obvious abdominal masses. Msk:  Strength and tone appear normal for age. Extremities: No clubbing or cyanosis. No edema. Distal pedal pulses are 2+ and equal bilaterally. Neuro: Alert and oriented X 3. No facial asymmetry. No focal deficit. Moves all extremities spontaneously. Psych:  Responds to questions appropriately with a normal affect.    Assessment and Plan:  Principal Problem:   New onset atrial  flutter (HCC) Active Problems:   Chronic deep vein thrombosis (DVT) (HCC)   Shortness of breath   Tobacco abuse    1. New onset atrial flutter with RVR with variable AV block: -Spontaneously converted to sinus rhythm at approximately 12:15 AM on 3/22 and has remained in sinus rhythm with heart rates in the 70s since -Echocardiogram pending to evaluate ejection fraction and wall motion -CTA chest negative for PE -Potassium found to be 3.9, will give 10 mEq of KCl this morning -Check magnesium and thyroid function and if abnormal, address -Negative troponin 3 -CHADS2VASc at least 1 (vascular disease) -Will need long-term full dose anticoagulation given his right lower extremity DVT. Patient has been without insurance for approximately the trust past 12 months and cannot afford a DOAC -Will likely need Coumadin in place of DOAC with follow-up in our Coumadin clinic -Transition patient to therapeutic dose of Lovenox as a bridge to therapeutic Coumadin, will likely need to remain inpatient until therapeutic INR of 2-3  2. Right lower extremity DVT: -Status post IVC filter -Transition to Coumadin as above -Will need follow-up with PCP -It has been presumed this was in the setting of his hip or dislocation in 2006, patient never with prior hypercoagulable workup -Recommended hypercoagulable workup -No family history of hypercoagulability  3. SOB: -Much improved since spontaneous conversion to sinus rhythm -Echo pending as above  4. Ongoing tobacco abuse: -Cessation is advised      Signed, Eula Listen, PA-C [facility] Pager: [phone] 02/18/2017, 7:59 AM

## 2017-02-18 NOTE — Progress Notes (Signed)
*  PRELIMINARY RESULTS* Echocardiogram 2D Echocardiogram has been performed.  Cristela Blue 02/18/2017, 9:26 AM

## 2017-02-19 LAB — HIV ANTIBODY (ROUTINE TESTING W REFLEX): HIV Screen 4th Generation wRfx: NONREACTIVE

## 2017-02-19 LAB — BASIC METABOLIC PANEL
Anion gap: 6 (ref 5–15)
BUN: 15 mg/dL (ref 6–20)
CO2: 27 mmol/L (ref 22–32)
Calcium: 8.8 mg/dL — ABNORMAL LOW (ref 8.9–10.3)
Chloride: 104 mmol/L (ref 101–111)
Creatinine, Ser: 1.15 mg/dL (ref 0.61–1.24)
GFR calc Af Amer: >60 mL/min (ref >60)
GFR calc non Af Amer: >60 mL/min (ref >60)
Glucose, Bld: 100 mg/dL — ABNORMAL HIGH (ref 65–99)
Potassium: 3.7 mmol/L (ref 3.5–5.1)
Sodium: 137 mmol/L (ref 135–145)

## 2017-02-19 LAB — CBC
HCT: 38.3 % — ABNORMAL LOW (ref 40.0–52.0)
Hemoglobin: 13.3 g/dL (ref 13.0–18.0)
MCH: 30.7 pg (ref 26.0–34.0)
MCHC: 34.7 g/dL (ref 32.0–36.0)
MCV: 88.4 fL (ref 80.0–100.0)
Platelets: 144 10*3/uL — ABNORMAL LOW (ref 150–440)
RBC: 4.33 MIL/uL — ABNORMAL LOW (ref 4.40–5.90)
RDW: 13.5 % (ref 11.5–14.5)
WBC: 7.6 10*3/uL (ref 3.8–10.6)

## 2017-02-19 LAB — PROTIME-INR
INR: 0.92
Prothrombin Time: 12.3 seconds (ref 11.4–15.2)

## 2017-02-19 MED ORDER — WARFARIN SODIUM 5 MG PO TABS
5.0000 mg | ORAL_TABLET | Freq: Every day | ORAL | Status: DC
  Administered 2017-02-19 – 2017-02-20 (×2): 5 mg via ORAL
  Filled 2017-02-19 (×2): qty 1

## 2017-02-19 NOTE — Care Management (Addendum)
patient is in agreement with having an appointment scheduled for him at [facility] on [address] February 24 2017 at 2:20p.  Provided this in writing to patient.  It turns out he has been seen at the clinic in the past.  he is on two medications that this CM would anticipate will be ordered at discharge are on the [facility] four dollar list and together cost would be 8 dollars which patient can pay.

## 2017-02-19 NOTE — Progress Notes (Addendum)
ANTICOAGULATION CONSULT NOTE - Initial Consult  Pharmacy Consult for warfarin Indication: DVT  No Known Allergies  Patient Measurements: Height: 5\' 8"  (172.7 cm) Weight: 199 lb 6.4 oz (90.4 kg) IBW/kg (Calculated) : 68.4  Vital Signs: Temp: 98.4 F (36.9 C) (03/23 0816) Temp Source: Oral (03/23 0816) BP: 105/56 (03/23 0816) Pulse Rate: 62 (03/23 0816)  Labs:  Recent Labs  02/17/17 1233 02/17/17 1608 02/17/17 2222 02/18/17 0312 02/18/17 0747 02/19/17 0416  HGB 15.6  --   --   --   --  13.3  HCT 46.3  --   --   --   --  38.3*  PLT 168  --   --   --   --  144*  APTT  --  26  --   --   --   --   LABPROT  --  12.9  --   --   --  12.3  INR  --  0.97  --   --   --  0.92  CREATININE 0.85  --   --   --   --  1.15  CKTOTAL 29*  --   --   --   --   --   TROPONINI <0.03 <0.03 <0.03 <0.03 <0.03  --     Estimated Creatinine Clearance: 93.2 mL/min (by C-G formula based on SCr of 1.15 mg/dL).   Medical History: Past Medical History:  Diagnosis Date  . DVT (deep venous thrombosis) (HCC)    a. In setting of dislocated hip, status post IVC filter, previously on Coumadin, noncompliant over the past 12 months  . Hx of blood clots    a. Patient denies any family history of clotting disorder, patient denies prior hypercoagulable workup  . PE (pulmonary thromboembolism) (HCC)     Medications:  Scheduled:  . enoxaparin (LOVENOX) injection  1 mg/kg Subcutaneous Q12H  . metoprolol tartrate  25 mg Oral BID  . sodium chloride flush  3 mL Intravenous Q12H  . warfarin  5 mg Oral q1800  . Warfarin - Pharmacist Dosing Inpatient   Does not apply q1800    Assessment: Pharmacy consulted to dose and monitor warfarin in this 41 year old male diagnosed with extensive DVT on admission. Patient has a history of DVT and PE and is s/p IVC filter placement and was prescribed warfarin outpatient. However, patient reports noncompliance and that he stopped taking warfarin approximately one year  ago.  This will be considered a new start warfarin.  Dosing history: Date INR Dose 3/21 0.97 5 mg 3/22 0.92 5 mg  Goal of Therapy:  INR 2-3 Monitor platelets by anticoagulation protocol: Yes   Plan:  Will start with warfarin 5 mg PO daily (will take at least 72 hours to see the effects of warfarin initiation on INR).  Patient is being bridged with therapeutic enoxaparin (1 mg/kg subcutaneously q12h)  Will check INR daily and CBC at least every 3 days per protocol.  Cindi Carbon, PharmD, BCPS Clinical Pharmacist 02/19/2017,9:18 AM  Addendum:  Patient counseled on 3/23.  Cindi Carbon, PharmD 02/19/17 11:45 AM

## 2017-02-19 NOTE — Progress Notes (Signed)
[employer] [facility] [place] [facility]   PATIENT NAME: Henry Dunn    MR#:  [id number]  DATE OF BIRTH:  Feb 17, 1976  SUBJECTIVE:  CHIEF COMPLAINT:   Chief Complaint  Patient presents with  . Back Pain  . Numbness    BL LE  Feels much better.No chest pain or shortness of breath REVIEW OF SYSTEMS:  Review of Systems  Constitutional: Negative for chills, fever and weight loss.  HENT: Negative for nosebleeds and sore throat.   Eyes: Negative for blurred vision.  Respiratory: Positive for shortness of breath. Negative for cough and wheezing.   Cardiovascular: Negative for chest pain, orthopnea, leg swelling and PND.  Gastrointestinal: Negative for abdominal pain, constipation, diarrhea, heartburn, nausea and vomiting.  Genitourinary: Negative for dysuria and urgency.  Musculoskeletal: Negative for back pain.  Skin: Negative for rash.  Neurological: Negative for dizziness, speech change, focal weakness and headaches.  Endo/Heme/Allergies: Does not bruise/bleed easily.  Psychiatric/Behavioral: Negative for depression.   DRUG ALLERGIES:  No Known Allergies VITALS:  Blood pressure (!) 112/59, pulse 61, temperature 97.9 F (36.6 C), temperature source Oral, resp. rate 18, height 5\' 8"  (1.727 m), weight 90.4 kg (199 lb 6.4 oz), SpO2 98 %. PHYSICAL EXAMINATION:  Physical Exam  Constitutional: He is oriented to person, place, and time and well-developed, well-nourished, and in no distress.  HENT:  Head: Normocephalic and atraumatic.  Eyes: Conjunctivae and EOM are normal. Pupils are equal, round, and reactive to light.  Neck: Normal range of motion. Neck supple. No tracheal deviation present. No thyromegaly present.  Cardiovascular: Normal rate, regular rhythm and normal heart sounds.   Pulmonary/Chest: Effort normal and breath sounds normal. No respiratory distress. He has no wheezes. He exhibits no tenderness.  Abdominal: Soft. Bowel sounds are normal. He exhibits no  distension. There is no tenderness.  Musculoskeletal: Normal range of motion.  Neurological: He is alert and oriented to person, place, and time. No cranial nerve deficit.  Skin: Skin is warm and dry. No rash noted.  Psychiatric: Mood and affect normal.   LABORATORY PANEL:  Male CBC  Recent Labs Lab 02/19/17 0416  WBC 7.6  HGB 13.3  HCT 38.3*  PLT 144*   ------------------------------------------------------------------------------------------------------------------ Chemistries   Recent Labs Lab 02/18/17 0747 02/19/17 0416  NA  --  137  K  --  3.7  CL  --  104  CO2  --  27  GLUCOSE  --  100*  BUN  --  15  CREATININE  --  1.15  CALCIUM  --  8.8*  MG 1.7  --    RADIOLOGY:  No results found. ASSESSMENT AND PLAN:  41 y.o. male with a known history of DVT, pulmonary embolism who presents to the hospital due to vague left lower back pain and also numbness of his left lower extremity and noted to have an extensive left lower extremity DVT and also incidentally noted to be in a. Fib/flutter with exertional shortness of breath.   * New onset atrial fibrillation/flutter: Now converted to normal sinus rhythm.  Echo within normal limit. - Started on Coumadin with Lovenox bridging. Pharmacy is managing this - Appreciate cardiology input.  - Negative serial troponins. - Negative.  CT scan of the chest for PE.  * History of extensive DVT-patient still has an extensive DVT in his left lower extremity. He status post IVC filter.  - started on Coumadin and Lovenox. - INR 0.9  * Shortness of breath - Likely due to  atrial fibrillation/flutter  All the records are reviewed and case discussed with Care Management/Social Worker. Management plans discussed with the patient, family and they are in agreement.  CODE STATUS: Full Code  TOTAL TIME TAKING CARE OF THIS PATIENT: 25 minutes.   More than 50% of the time was spent in counseling/coordination of care: YES  POSSIBLE D/C IN  2-3 DAYS, DEPENDING ON CLINICAL CONDITION.  He will need INR between 2-3 and due to no Payor Source and noncompliance.  He will need to stay in the hospital to get fully anticoagulated.   Delfino Lovett M.D on 02/19/2017 at 2:22 PM  Between 7am to 6pm - Pager - [phone]  After 6pm go to [email or web address] - Scientist, research (life sciences) [place] [facility]  Office  [phone]  CC: Primary care physician; No PCP Per Patient  Note: This dictation was prepared with Dragon dictation along with smaller phrase technology. Any transcriptional errors that result from this process are unintentional.

## 2017-02-20 LAB — PROTIME-INR
INR: 0.86
Prothrombin Time: 11.7 seconds (ref 11.4–15.2)

## 2017-02-20 NOTE — Progress Notes (Signed)
A&O. Independent. Pos for DVT. On coumadin and lovenox.

## 2017-02-20 NOTE — Progress Notes (Signed)
[employer] -  at [facility]   PATIENT NAME: Henry Dunn    MR#:  [id number]  DATE OF BIRTH:  [birth year]  SUBJECTIVE:  CHIEF COMPLAINT:   Chief Complaint  Patient presents with  . Back Pain  . Numbness    BL LE  Feels much better.No chest pain or shortness of breath.Denies any palpitations REVIEW OF SYSTEMS:  Review of Systems  Constitutional: Negative for chills, fever and weight loss.  HENT: Negative for nosebleeds and sore throat.   Eyes: Negative for blurred vision.  Respiratory: Positive for shortness of breath. Negative for cough and wheezing.   Cardiovascular: Negative for chest pain, orthopnea, leg swelling and PND.  Gastrointestinal: Negative for abdominal pain, constipation, diarrhea, heartburn, nausea and vomiting.  Genitourinary: Negative for dysuria and urgency.  Musculoskeletal: Negative for back pain.  Skin: Negative for rash.  Neurological: Negative for dizziness, speech change, focal weakness and headaches.  Endo/Heme/Allergies: Does not bruise/bleed easily.  Psychiatric/Behavioral: Negative for depression.   DRUG ALLERGIES:  No Known Allergies VITALS:  Blood pressure (!) 104/58, pulse (!) 57, temperature 98 F (36.7 C), temperature source Oral, resp. rate 18, height 5\' 8"  (1.727 m), weight 90.4 kg (199 lb 6.4 oz), SpO2 98 %. PHYSICAL EXAMINATION:  Physical Exam  Constitutional: He is oriented to person, place, and time and well-developed, well-nourished, and in no distress.  HENT:  Head: Normocephalic and atraumatic.  Eyes: Conjunctivae and EOM are normal. Pupils are equal, round, and reactive to light.  Neck: Normal range of motion. Neck supple. No tracheal deviation present. No thyromegaly present.  Cardiovascular: Normal rate, regular rhythm and normal heart sounds.   Pulmonary/Chest: Effort normal and breath sounds normal. No respiratory distress. He has no wheezes. He exhibits no tenderness.  Abdominal: Soft. Bowel sounds  are normal. He exhibits no distension. There is no tenderness.  Musculoskeletal: Normal range of motion.  Neurological: He is alert and oriented to person, place, and time. No cranial nerve deficit.  Skin: Skin is warm and dry. No rash noted.  Psychiatric: Mood and affect normal.   LABORATORY PANEL:  Male CBC  Recent Labs Lab 02/19/17 0416  WBC 7.6  HGB 13.3  HCT 38.3*  PLT 144*   ------------------------------------------------------------------------------------------------------------------ Chemistries   Recent Labs Lab 02/18/17 0747 02/19/17 0416  NA  --  137  K  --  3.7  CL  --  104  CO2  --  27  GLUCOSE  --  100*  BUN  --  15  CREATININE  --  1.15  CALCIUM  --  8.8*  MG 1.7  --    RADIOLOGY:  No results found. ASSESSMENT AND PLAN:  41 y.o. male with a known history of DVT, pulmonary embolism who presents to the hospital due to vague left lower back pain and also numbness of his left lower extremity and noted to have an extensive left lower extremity DVT and also incidentally noted to be in a. Fib/flutter with exertional shortness of breath.   * New onset atrial fibrillation/flutter: Now converted to normal sinus rhythm.  Echo within normal limit. - Started on Coumadin with Lovenox bridging. Pharmacy is managing this -Patient does not have insurance could not afford NOAC - Appreciate cardiology input.  - Negative serial troponins. - Neg CT scan of the chest for PE.  * History of extensive DVT-patient still has an extensive DVT in his left lower extremity. He status post IVC filter.  - started on Coumadin and Lovenox. -  INR 0.9--0.86 -Coumadin per pharmacy management  * Shortness of breath - Likely due to atrial fibrillation/flutter improved denies any today,  All the records are reviewed and case discussed with Care Management/Social Worker. Management plans discussed with the patient, family and they are in agreement.  CODE STATUS: Full Code  TOTAL  TIME TAKING CARE OF THIS PATIENT: 25 minutes.   More than 50% of the time was spent in counseling/coordination of care: YES  POSSIBLE D/C IN 2-3 DAYS, DEPENDING ON CLINICAL CONDITION.  He will need INR between 2-3 and due to no Payor Source and noncompliance.  He will need to stay in the hospital to get fully anticoagulated.   Ramonita Lab M.D on 02/20/2017 at 2:18 PM  Between 7am to 6pm - Pager - [phone]  After 6pm go to [email or web address] - Scientist, research (life sciences) [place]  Office  [phone]  CC: Primary care physician; No PCP Per Patient  Note: This dictation was prepared with Dragon dictation along with smaller phrase technology. Any transcriptional errors that result from this process are unintentional.

## 2017-02-20 NOTE — Clinical Social Work Note (Signed)
CSW received consult that patient has no insurance. Patient is already being followed by [facility]. CSW signing off. Please re-consult if any other needs arise.  Argentina Ponder, MSW, Theresia Majors [phone]

## 2017-02-20 NOTE — Progress Notes (Signed)
ANTICOAGULATION CONSULT NOTE - follow up  Pharmacy Consult for warfarin Indication: DVT  No Known Allergies  Patient Measurements: Height: 5\' 8"  (172.7 cm) Weight: 199 lb 6.4 oz (90.4 kg) IBW/kg (Calculated) : 68.4  Vital Signs: Temp: 97.8 F (36.6 C) (03/24 0327) Temp Source: Oral (03/24 0327) BP: 104/59 (03/24 0327) Pulse Rate: 65 (03/24 0959)  Labs:  Recent Labs  02/17/17 1233 02/17/17 1608 02/17/17 2222 02/18/17 0312 02/18/17 0747 02/19/17 0416 02/20/17 0603  HGB 15.6  --   --   --   --  13.3  --   HCT 46.3  --   --   --   --  38.3*  --   PLT 168  --   --   --   --  144*  --   APTT  --  26  --   --   --   --   --   LABPROT  --  12.9  --   --   --  12.3 11.7  INR  --  0.97  --   --   --  0.92 0.86  CREATININE 0.85  --   --   --   --  1.15  --   CKTOTAL 29*  --   --   --   --   --   --   TROPONINI <0.03 <0.03 <0.03 <0.03 <0.03  --   --     Estimated Creatinine Clearance: 93.2 mL/min (by C-G formula based on SCr of 1.15 mg/dL).   Medical History: Past Medical History:  Diagnosis Date  . DVT (deep venous thrombosis) (HCC)    a. In setting of dislocated hip, status post IVC filter, previously on Coumadin, noncompliant over the past 12 months  . Hx of blood clots    a. Patient denies any family history of clotting disorder, patient denies prior hypercoagulable workup  . PE (pulmonary thromboembolism) (HCC)     Medications:  Scheduled:  . enoxaparin (LOVENOX) injection  1 mg/kg Subcutaneous Q12H  . metoprolol tartrate  25 mg Oral BID  . sodium chloride flush  3 mL Intravenous Q12H  . warfarin  5 mg Oral q1800  . Warfarin - Pharmacist Dosing Inpatient   Does not apply q1800    Assessment: Pharmacy consulted to dose and monitor warfarin in this 41 year old male diagnosed with extensive DVT on admission. Patient has a history of DVT and PE and is s/p IVC filter placement and was prescribed warfarin outpatient. However, patient reports noncompliance and that  he stopped taking warfarin approximately one year ago.  This will be considered a new start warfarin.  Dosing history: Date INR Dose 3/21 0.97 5 mg 3/22 0.92 5 mg 3/23     0.86  Goal of Therapy:  INR 2-3 Monitor platelets by anticoagulation protocol: Yes   Plan:  Will continue warfarin 5 mg PO daily (will take at least 72 hours to see the effects of warfarin initiation on INR).  Patient is being bridged with therapeutic enoxaparin (1 mg/kg subcutaneously q12h)  Will check INR daily and CBC at least every 3 days per protocol.  Addendum:  Patient counseled on 3/23.  Stormy Card [facility] Clinical Pharmacist 02/20/17 10:36 AM

## 2017-02-21 LAB — PROTIME-INR
INR: 0.99
Prothrombin Time: 13.1 seconds (ref 11.4–15.2)

## 2017-02-21 MED ORDER — WARFARIN SODIUM 5 MG PO TABS
10.0000 mg | ORAL_TABLET | Freq: Once | ORAL | Status: AC
  Administered 2017-02-21: 10 mg via ORAL
  Filled 2017-02-21: qty 2

## 2017-02-21 NOTE — Plan of Care (Signed)
Problem: Physical Regulation: Goal: Ability to maintain clinical measurements within normal limits will improve Outcome: Progressing Monitoring INR until therapeutic.

## 2017-02-21 NOTE — Progress Notes (Signed)
[employer] -  at [facility]   PATIENT NAME: Henry Dunn    MR#:  [id number]  DATE OF BIRTH:  01-19-1976  SUBJECTIVE:  CHIEF COMPLAINT:   Chief Complaint  Patient presents with  . Back Pain  . Numbness    BL LE  Feels much better.No chest pain or shortness of breath.Denies any palpitations, NO COMPLAINTS REVIEW OF SYSTEMS:  Review of Systems  Constitutional: Negative for chills, fever and weight loss.  HENT: Negative for nosebleeds and sore throat.   Eyes: Negative for blurred vision.  Respiratory: Positive for shortness of breath. Negative for cough and wheezing.   Cardiovascular: Negative for chest pain, orthopnea, leg swelling and PND.  Gastrointestinal: Negative for abdominal pain, constipation, diarrhea, heartburn, nausea and vomiting.  Genitourinary: Negative for dysuria and urgency.  Musculoskeletal: Negative for back pain.  Skin: Negative for rash.  Neurological: Negative for dizziness, speech change, focal weakness and headaches.  Endo/Heme/Allergies: Does not bruise/bleed easily.  Psychiatric/Behavioral: Negative for depression.   DRUG ALLERGIES:  No Known Allergies VITALS:  Blood pressure 116/62, pulse 74, temperature 97.7 F (36.5 C), temperature source Oral, resp. rate 16, height 5\' 8"  (1.727 m), weight 90.4 kg (199 lb 6.4 oz), SpO2 97 %. PHYSICAL EXAMINATION:  Physical Exam  Constitutional: He is oriented to person, place, and time and well-developed, well-nourished, and in no distress.  HENT:  Head: Normocephalic and atraumatic.  Eyes: Conjunctivae and EOM are normal. Pupils are equal, round, and reactive to light.  Neck: Normal range of motion. Neck supple. No tracheal deviation present. No thyromegaly present.  Cardiovascular: Normal rate, regular rhythm and normal heart sounds.   Pulmonary/Chest: Effort normal and breath sounds normal. No respiratory distress. He has no wheezes. He exhibits no tenderness.  Abdominal: Soft. Bowel  sounds are normal. He exhibits no distension. There is no tenderness.  Musculoskeletal: Normal range of motion.  Neurological: He is alert and oriented to person, place, and time. No cranial nerve deficit.  Skin: Skin is warm and dry. No rash noted.  Psychiatric: Mood and affect normal.   LABORATORY PANEL:  Male CBC  Recent Labs Lab 02/19/17 0416  WBC 7.6  HGB 13.3  HCT 38.3*  PLT 144*   ------------------------------------------------------------------------------------------------------------------ Chemistries   Recent Labs Lab 02/18/17 0747 02/19/17 0416  NA  --  137  K  --  3.7  CL  --  104  CO2  --  27  GLUCOSE  --  100*  BUN  --  15  CREATININE  --  1.15  CALCIUM  --  8.8*  MG 1.7  --    RADIOLOGY:  No results found. ASSESSMENT AND PLAN:  41 y.o. male with a known history of DVT, pulmonary embolism who presents to the hospital due to vague left lower back pain and also numbness of his left lower extremity and noted to have an extensive left lower extremity DVT and also incidentally noted to be in a. Fib/flutter with exertional shortness of breath.   * New onset atrial fibrillation/flutter: Now converted to normal sinus rhythm.  Echo within normal limit. - on Coumadin with Lovenox bridging. -Clinically stable -Patient does not have insurance could not afford NOAC - Appreciate cardiology input.  - Negative serial troponins. - Neg CT scan of the chest for PE.  * History of extensive DVT-patient still has an extensive DVT in his left lower extremity. He status post IVC filter.  - started on Coumadin and Lovenox. - INR 0.9--0.86--0.99,Discussed with  the on-call pharmacist is no big change in INR in 48 hours, they will page me back -Coumadin per pharmacy management  * Shortness of breath - Likely due to atrial fibrillation/flutter improved denies any today,  All the records are reviewed and case discussed with Care Management/Social Worker. Management plans  discussed with the patient, family and they are in agreement.  CODE STATUS: Full Code  TOTAL TIME TAKING CARE OF THIS PATIENT: 25 minutes.   More than 50% of the time was spent in counseling/coordination of care: YES  POSSIBLE D/C IN 2 DAYS after INR is therapeutic, DEPENDING ON CLINICAL CONDITION.  He will need INR between 2-3 and due to no Payor Source and noncompliance.  He will need to stay in the hospital to get fully anticoagulated.   Ramonita Lab M.D on 02/21/2017 at 1:19 PM  Between 7am to 6pm - Pager - [phone]  After 6pm go to [email or web address] - Scientist, research (life sciences) [place] [facility]  Office  [phone]  CC: Primary care physician; No PCP Per Patient  Note: This dictation was prepared with Dragon dictation along with smaller phrase technology. Any transcriptional errors that result from this process are unintentional.

## 2017-02-21 NOTE — Plan of Care (Signed)
Problem: Pain Managment: Goal: General experience of comfort will improve Outcome: Progressing No voiced complaints of pain this shift.  Up in room without difficulty.

## 2017-02-21 NOTE — Progress Notes (Signed)
MD notified via text that patient in now a-fib in 67s. This a.m. Patient was SA 1st degree HB 70s. I will continue to assess.

## 2017-02-21 NOTE — Progress Notes (Addendum)
ANTICOAGULATION CONSULT NOTE - follow up  Pharmacy Consult for warfarin Indication: DVT  No Known Allergies  Patient Measurements: Height: 5\' 8"  (172.7 cm) Weight: 199 lb 6.4 oz (90.4 kg) IBW/kg (Calculated) : 68.4  Vital Signs: Temp: 98 F (36.7 C) (03/25 0539) Temp Source: Oral (03/25 0539) BP: 100/52 (03/25 0539) Pulse Rate: 74 (03/25 0539)  Labs:  Recent Labs  02/19/17 0416 02/20/17 0603 02/21/17 0535  HGB 13.3  --   --   HCT 38.3*  --   --   PLT 144*  --   --   LABPROT 12.3 11.7 13.1  INR 0.92 0.86 0.99  CREATININE 1.15  --   --     Estimated Creatinine Clearance: 93.2 mL/min (by C-G formula based on SCr of 1.15 mg/dL).   Medical History: Past Medical History:  Diagnosis Date  . DVT (deep venous thrombosis) (HCC)    a. In setting of dislocated hip, status post IVC filter, previously on Coumadin, noncompliant over the past 12 months  . Hx of blood clots    a. Patient denies any family history of clotting disorder, patient denies prior hypercoagulable workup  . PE (pulmonary thromboembolism) (HCC)     Medications:  Scheduled:  . enoxaparin (LOVENOX) injection  1 mg/kg Subcutaneous Q12H  . metoprolol tartrate  25 mg Oral BID  . sodium chloride flush  3 mL Intravenous Q12H  . warfarin  5 mg Oral q1800  . Warfarin - Pharmacist Dosing Inpatient   Does not apply q1800    Assessment: Pharmacy consulted to dose and monitor warfarin in this 41 year old male diagnosed with extensive DVT on admission. Patient has a history of DVT and PE and is s/p IVC filter placement and was prescribed warfarin outpatient. However, patient reports noncompliance and that he stopped taking warfarin approximately one year ago.  This will be considered a new start warfarin.  Dosing history: Date INR Dose 3/21 0.97 5 mg 3/22 0.92 5 mg 3/23     0.92     5 mg 3/24     0.86     5 mg 3/25     0.99  Goal of Therapy:  INR 2-3 Monitor platelets by anticoagulation protocol: Yes    Plan:  Will increase dose to warfarin 10 mg PO for tonight. Reevaluate tomorrow.   Patient is being bridged with therapeutic enoxaparin (1 mg/kg subcutaneously q12h)  Will check INR daily and CBC at least every 3 days per protocol.  Addendum:  Patient counseled on 3/23.  Stormy Card [facility] Clinical Pharmacist 02/21/17 8:03 AM

## 2017-02-22 ENCOUNTER — Encounter: Payer: Self-pay | Admitting: *Deleted

## 2017-02-22 LAB — BASIC METABOLIC PANEL
Anion gap: 8 (ref 5–15)
BUN: 17 mg/dL (ref 6–20)
CO2: 26 mmol/L (ref 22–32)
Calcium: 8.9 mg/dL (ref 8.9–10.3)
Chloride: 102 mmol/L (ref 101–111)
Creatinine, Ser: 0.84 mg/dL (ref 0.61–1.24)
GFR calc Af Amer: >60 mL/min (ref >60)
GFR calc non Af Amer: >60 mL/min (ref >60)
Glucose, Bld: 100 mg/dL — ABNORMAL HIGH (ref 65–99)
Potassium: 3.8 mmol/L (ref 3.5–5.1)
Sodium: 136 mmol/L (ref 135–145)

## 2017-02-22 LAB — PROTIME-INR
INR: 1.35
Prothrombin Time: 16.8 seconds — ABNORMAL HIGH (ref 11.4–15.2)

## 2017-02-22 LAB — CBC
HCT: 41.1 % (ref 40.0–52.0)
Hemoglobin: 14.1 g/dL (ref 13.0–18.0)
MCH: 30.1 pg (ref 26.0–34.0)
MCHC: 34.2 g/dL (ref 32.0–36.0)
MCV: 88 fL (ref 80.0–100.0)
Platelets: 185 10*3/uL (ref 150–440)
RBC: 4.67 MIL/uL (ref 4.40–5.90)
RDW: 13.4 % (ref 11.5–14.5)
WBC: 9 10*3/uL (ref 3.8–10.6)

## 2017-02-22 MED ORDER — POTASSIUM CHLORIDE CRYS ER 20 MEQ PO TBCR
20.0000 meq | EXTENDED_RELEASE_TABLET | Freq: Once | ORAL | Status: AC
  Administered 2017-02-22: 20 meq via ORAL
  Filled 2017-02-22: qty 1

## 2017-02-22 MED ORDER — MAGNESIUM OXIDE 400 (241.3 MG) MG PO TABS
400.0000 mg | ORAL_TABLET | Freq: Every day | ORAL | Status: DC
  Administered 2017-02-22 – 2017-02-25 (×4): 400 mg via ORAL
  Filled 2017-02-22 (×4): qty 1

## 2017-02-22 MED ORDER — OFF THE BEAT BOOK
Freq: Once | Status: AC
  Administered 2017-02-22: 02:00:00

## 2017-02-22 MED ORDER — WARFARIN SODIUM 5 MG PO TABS
10.0000 mg | ORAL_TABLET | Freq: Every day | ORAL | Status: DC
  Administered 2017-02-22: 10 mg via ORAL
  Filled 2017-02-22: qty 2

## 2017-02-22 NOTE — Progress Notes (Signed)
[employer] - [place] at [facility]   PATIENT NAME: Henry Dunn    MR#:  [id number]  DATE OF BIRTH:  1975/12/26  SUBJECTIVE:  CHIEF COMPLAINT:   Chief Complaint  Patient presents with  . Back Pain  . Numbness    BL LE  No complaints. Wants to take shower  No chest pain or shortness of breath.Denies any palpitations, REVIEW OF SYSTEMS:  Review of Systems  Constitutional: Negative for chills, fever and weight loss.  HENT: Negative for nosebleeds and sore throat.   Eyes: Negative for blurred vision.  Respiratory: Positive for shortness of breath. Negative for cough and wheezing.   Cardiovascular: Negative for chest pain, orthopnea, leg swelling and PND.  Gastrointestinal: Negative for abdominal pain, constipation, diarrhea, heartburn, nausea and vomiting.  Genitourinary: Negative for dysuria and urgency.  Musculoskeletal: Negative for back pain.  Skin: Negative for rash.  Neurological: Negative for dizziness, speech change, focal weakness and headaches.  Endo/Heme/Allergies: Does not bruise/bleed easily.  Psychiatric/Behavioral: Negative for depression.   DRUG ALLERGIES:  No Known Allergies VITALS:  Blood pressure (!) 95/46, pulse 66, temperature 98.5 F (36.9 C), temperature source Oral, resp. rate 12, height 5\' 8"  (1.727 m), weight 90.4 kg (199 lb 6.4 oz), SpO2 95 %. PHYSICAL EXAMINATION:  Physical Exam  Constitutional: He is oriented to person, place, and time and well-developed, well-nourished, and in no distress.  HENT:  Head: Normocephalic and atraumatic.  Eyes: Conjunctivae and EOM are normal. Pupils are equal, round, and reactive to light.  Neck: Normal range of motion. Neck supple. No tracheal deviation present. No thyromegaly present.  Cardiovascular: Normal rate, regular rhythm and normal heart sounds.   Pulmonary/Chest: Effort normal and breath sounds normal. No respiratory distress. He has no wheezes. He exhibits no tenderness.  Abdominal:  Soft. Bowel sounds are normal. He exhibits no distension. There is no tenderness.  Musculoskeletal: Normal range of motion.  Neurological: He is alert and oriented to person, place, and time. No cranial nerve deficit.  Skin: Skin is warm and dry. No rash noted.  Psychiatric: Mood and affect normal.   LABORATORY PANEL:  Male CBC  Recent Labs Lab 02/22/17 0418  WBC 9.0  HGB 14.1  HCT 41.1  PLT 185   ------------------------------------------------------------------------------------------------------------------ Chemistries   Recent Labs Lab 02/18/17 0747  02/22/17 0418  NA  --   < > 136  K  --   < > 3.8  CL  --   < > 102  CO2  --   < > 26  GLUCOSE  --   < > 100*  BUN  --   < > 17  CREATININE  --   < > 0.84  CALCIUM  --   < > 8.9  MG 1.7  --   --   < > = values in this interval not displayed. RADIOLOGY:  No results found. ASSESSMENT AND PLAN:  41 y.o. male with a known history of DVT, pulmonary embolism who presents to the hospital due to vague left lower back pain and also numbness of his left lower extremity and noted to have an extensive left lower extremity DVT and also incidentally noted to be in a. Fib/flutter with exertional shortness of breath.   * New onset atrial fibrillation/flutter: Now converted to normal sinus rhythm.  Echo within normal limit. - on Coumadin with Lovenox bridging, INR is 1.35 today -Clinically stable -Patient does not have insurance could not afford NOAC - Appreciate cardiology input.  -  Negative serial troponins. - Neg CT scan of the chest for PE.  * History of extensive DVT-patient still has an extensive DVT in his left lower extremity. He status post IVC filter.  - started on Coumadin and Lovenox. - INR 0.9--0.86--0.99, 1.35 -Coumadin per pharmacy management  * Shortness of breath - Likely due to atrial fibrillation/flutter improved denies any today,  All the records are reviewed and case discussed with Care Management/Social  Worker. Management plans discussed with the patient, family and they are in agreement.  CODE STATUS: Full Code  TOTAL TIME TAKING CARE OF THIS PATIENT: 25 minutes.   More than 50% of the time was spent in counseling/coordination of care: YES  POSSIBLE D/C IN 1-2 DAYS after INR is therapeutic, DEPENDING ON CLINICAL CONDITION.  He will need INR between 2-3 and due to no Payor Source and noncompliance.  He will need to stay in the hospital to get fully anticoagulated.   Ramonita Lab M.D on 02/22/2017 at 4:35 PM  Between 7am to 6pm - Pager - [phone]  After 6pm go to [email or web address] - Scientist, research (life sciences) [place] [facility]  Office  [phone]  CC: Primary care physician; No PCP Per Patient  Note: This dictation was prepared with Dragon dictation along with smaller phrase technology. Any transcriptional errors that result from this process are unintentional.

## 2017-02-22 NOTE — Progress Notes (Signed)
   Short run of rate controlled Afib the prior evening. Currently in sinus rhythm with heart rates in the 70s bpm. INR remains subtherapeutic at 1.35. Potassium slightly low at 3.8. Magnesium from 3/22 of 1/7. Recommend repletion K+ to 4.0 and magnesium to 2.0. On heparin to Coumadin bridge.

## 2017-02-22 NOTE — Plan of Care (Signed)
Problem: Cardiac: Goal: Ability to achieve and maintain adequate cardiopulmonary perfusion will improve Outcome: Progressing Intermittent Atrial fib with rapid ventricular rates with activity to bathroom noted this shift.  HR controlled after scheduled metoprolol given.

## 2017-02-22 NOTE — Care Management (Signed)
Patient's INR is not therapeutic.  Anticipate should reach that level within next 24 - 48 hours

## 2017-02-22 NOTE — Progress Notes (Signed)
ANTICOAGULATION CONSULT NOTE - follow up  Pharmacy Consult for warfarin Indication: DVT  No Known Allergies  Patient Measurements: Height: 5\' 8"  (172.7 cm) Weight: 199 lb 6.4 oz (90.4 kg) IBW/kg (Calculated) : 68.4  Vital Signs: Temp: 98.5 F (36.9 C) (03/26 0430) Temp Source: Oral (03/26 0430) BP: 95/46 (03/26 1149) Pulse Rate: 66 (03/26 1149)  Labs:  Recent Labs  02/20/17 0603 02/21/17 0535 02/22/17 0418  HGB  --   --  14.1  HCT  --   --  41.1  PLT  --   --  185  LABPROT 11.7 13.1 16.8*  INR 0.86 0.99 1.35  CREATININE  --   --  0.84    Estimated Creatinine Clearance: 127.6 mL/min (by C-G formula based on SCr of 0.84 mg/dL).   Medical History: Past Medical History:  Diagnosis Date  . DVT (deep venous thrombosis) (HCC)    a. In setting of dislocated hip, status post IVC filter, previously on Coumadin, noncompliant over the past 12 months  . Hx of blood clots    a. Patient denies any family history of clotting disorder, patient denies prior hypercoagulable workup  . PE (pulmonary thromboembolism) (HCC)     Medications:  Scheduled:  . enoxaparin (LOVENOX) injection  1 mg/kg Subcutaneous Q12H  . magnesium oxide  400 mg Oral Daily  . metoprolol tartrate  25 mg Oral BID  . potassium chloride  20 mEq Oral Once  . sodium chloride flush  3 mL Intravenous Q12H  . warfarin  10 mg Oral q1800  . Warfarin - Pharmacist Dosing Inpatient   Does not apply q1800    Assessment: Pharmacy consulted to dose and monitor warfarin in this 41 year old male diagnosed with extensive DVT on admission. Patient has a history of DVT and PE and is s/p IVC filter placement and was prescribed warfarin outpatient. However, patient reports noncompliance and that he stopped taking warfarin approximately one year ago.  This will be considered a new start warfarin.  Dosing history: Date INR Dose 3/21 0.97 5 mg 3/22 0.92 5 mg 3/23     0.92     5 mg 3/24     0.86     5 mg 3/25      0.99  Goal of Therapy:  INR 2-3 Monitor platelets by anticoagulation protocol: Yes   Plan:  Will increase dose to warfarin 10 mg daily. Reevaluate tomorrow.   Patient is being bridged with therapeutic enoxaparin (1 mg/kg subcutaneously q12h)  Will check INR daily and CBC at least every 3 days per protocol.  Addendum:  Patient counseled on 3/23.  Valentina Gu [facility] Clinical Pharmacist 02/22/17 2:28 PM

## 2017-02-23 DIAGNOSIS — I825Y2 Chronic embolism and thrombosis of unspecified deep veins of left proximal lower extremity: Secondary | ICD-10-CM

## 2017-02-23 DIAGNOSIS — L03119 Cellulitis of unspecified part of limb: Secondary | ICD-10-CM

## 2017-02-23 DIAGNOSIS — L02419 Cutaneous abscess of limb, unspecified: Secondary | ICD-10-CM

## 2017-02-23 LAB — PROTIME-INR
INR: 1.67
Prothrombin Time: 19.9 seconds — ABNORMAL HIGH (ref 11.4–15.2)

## 2017-02-23 MED ORDER — VANCOMYCIN HCL 10 G IV SOLR
1250.0000 mg | Freq: Once | INTRAVENOUS | Status: AC
  Administered 2017-02-23: 1250 mg via INTRAVENOUS
  Filled 2017-02-23: qty 1250

## 2017-02-23 MED ORDER — WARFARIN SODIUM 7.5 MG PO TABS
7.5000 mg | ORAL_TABLET | Freq: Every day | ORAL | Status: DC
  Administered 2017-02-23 – 2017-02-24 (×2): 7.5 mg via ORAL
  Filled 2017-02-23 (×3): qty 1
  Filled 2017-02-23: qty 1.5

## 2017-02-23 MED ORDER — VANCOMYCIN HCL 10 G IV SOLR
1250.0000 mg | Freq: Three times a day (TID) | INTRAVENOUS | Status: DC
  Administered 2017-02-23 – 2017-02-25 (×5): 1250 mg via INTRAVENOUS
  Filled 2017-02-23 (×7): qty 1250

## 2017-02-23 MED ORDER — POTASSIUM CHLORIDE CRYS ER 20 MEQ PO TBCR
40.0000 meq | EXTENDED_RELEASE_TABLET | Freq: Once | ORAL | Status: AC
  Administered 2017-02-23: 40 meq via ORAL
  Filled 2017-02-23: qty 2

## 2017-02-23 NOTE — Progress Notes (Signed)
This RN was notified by central telemetry that this pt's HR was up into the 170's.  This RN went into the pt's room and he was in the bathroom cleaning his groin area where the abscess is, states was washing it with warm compresses ans it started bleeding and it scared him.  Got pt back to bed so I could help him clean up. Groin site is no longer bleeding, we did put a washcloth in between his leg and groin for comfort.  Central telemetry then said pt is now going in and out of afib. Pt does have a history of afib though. So will continue to monitor. Shirley Friar, RN, BSN

## 2017-02-23 NOTE — Progress Notes (Signed)
No further arrhythmia noted on telemetry. INR remained subtherapeutic at 1.67 this morning. Potassium 3.8 this morning, recommend repletion to goal 4.0.

## 2017-02-23 NOTE — Progress Notes (Signed)
ANTICOAGULATION CONSULT NOTE - follow up  Pharmacy Consult for warfarin Indication: DVT  No Known Allergies  Patient Measurements: Height: 5\' 8"  (172.7 cm) Weight: 199 lb 6.4 oz (90.4 kg) IBW/kg (Calculated) : 68.4  Vital Signs: Temp: 97.9 F (36.6 C) (03/27 0751) Temp Source: Oral (03/27 0751) BP: 101/62 (03/27 0751) Pulse Rate: 58 (03/27 0751)  Labs:  Recent Labs  02/21/17 0535 02/22/17 0418 02/23/17 0404  HGB  --  14.1  --   HCT  --  41.1  --   PLT  --  185  --   LABPROT 13.1 16.8* 19.9*  INR 0.99 1.35 1.67  CREATININE  --  0.84  --     Estimated Creatinine Clearance: 127.6 mL/min (by C-G formula based on SCr of 0.84 mg/dL).   Medical History: Past Medical History:  Diagnosis Date  . DVT (deep venous thrombosis) (HCC)    a. In setting of dislocated hip, status post IVC filter, previously on Coumadin, noncompliant over the past 12 months  . Hx of blood clots    a. Patient denies any family history of clotting disorder, patient denies prior hypercoagulable workup  . PE (pulmonary thromboembolism) (HCC)     Medications:  Scheduled:  . enoxaparin (LOVENOX) injection  1 mg/kg Subcutaneous Q12H  . magnesium oxide  400 mg Oral Daily  . metoprolol tartrate  25 mg Oral BID  . sodium chloride flush  3 mL Intravenous Q12H  . warfarin  10 mg Oral q1800  . Warfarin - Pharmacist Dosing Inpatient   Does not apply q1800    Assessment: Pharmacy consulted to dose and monitor warfarin in this 41 year old male diagnosed with extensive DVT on admission. Patient has a history of DVT and PE and is s/p IVC filter placement and was prescribed warfarin outpatient. However, patient reports noncompliance and that he stopped taking warfarin approximately one year ago.  This will be considered a new start warfarin.  Dosing history: Date INR Dose 3/21 0.97 5 mg 3/22 0.92 5 mg 3/23     0.92     5 mg 3/24     0.86     5 mg 3/25     0.99  Goal of Therapy:  INR 2-3 Monitor  platelets by anticoagulation protocol: Yes   Plan:  INR rose to 1.67 (1.35) this AM. Patient has received 2 x 10 mg doses (before that had 3 days of 5 mg tablets). Will resume dosing at 7.5 mg po daily and recheck INR tomorrow morning.   Will check INR daily and CBC at least every 3 days per protocol.   Addendum:  Patient counseled on 3/23.  Carola Frost, [facility] Clinical Pharmacist 02/23/17 10:14 AM

## 2017-02-23 NOTE — Plan of Care (Signed)
Problem: Pain Managment: Goal: General experience of comfort will improve Outcome: Progressing No complaints of pain, prn medications  Problem: Tissue Perfusion: Goal: Risk factors for ineffective tissue perfusion will decrease Outcome: Progressing SQ Lovenox BID, PO coumadin  Problem: Cardiac: Goal: Ability to achieve and maintain adequate cardiopulmonary perfusion will improve Outcome: Progressing Remains in NSR on Metoprolol

## 2017-02-23 NOTE — Progress Notes (Signed)
[employer] [facility] [place] [facility]   PATIENT NAME: Henry Dunn    MR#:  [id number]  DATE OF BIRTH:  [birth year]  SUBJECTIVE:  CHIEF COMPLAINT:   Chief Complaint  Patient presents with  . Back Pain  . Numbness    BL LE  Patient is reporting left groin swelling which is painful. Denies chest pain REVIEW OF SYSTEMS:  Review of Systems  Constitutional: Negative for chills, fever and weight loss.  HENT: Negative for nosebleeds and sore throat.   Eyes: Negative for blurred vision.  Respiratory: Positive for shortness of breath. Negative for cough and wheezing.   Cardiovascular: Negative for chest pain, orthopnea, leg swelling and PND.  Gastrointestinal: Negative for abdominal pain, constipation, diarrhea, heartburn, nausea and vomiting.  Genitourinary: Negative for dysuria and urgency.  Musculoskeletal: Negative for back pain.       Left groin swelling with pain  Skin: Negative for rash.  Neurological: Negative for dizziness, speech change, focal weakness and headaches.  Endo/Heme/Allergies: Does not bruise/bleed easily.  Psychiatric/Behavioral: Negative for depression.   DRUG ALLERGIES:  No Known Allergies VITALS:  Blood pressure (!) 103/59, pulse 72, temperature 98.3 F (36.8 C), resp. rate 18, height 5\' 8"  (1.727 m), weight 90.4 kg (199 lb 6.4 oz), SpO2 95 %. PHYSICAL EXAMINATION:  Physical Exam  Constitutional: He is oriented to person, place, and time and well-developed, well-nourished, and in no distress.  HENT:  Head: Normocephalic and atraumatic.  Eyes: Conjunctivae and EOM are normal. Pupils are equal, round, and reactive to light.  Neck: Normal range of motion. Neck supple. No tracheal deviation present. No thyromegaly present.  Cardiovascular: Normal rate, regular rhythm and normal heart sounds.   Pulmonary/Chest: Effort normal and breath sounds normal. No respiratory distress. He has no wheezes. He exhibits no tenderness.  Abdominal: Soft. Bowel  sounds are normal. He exhibits no distension. There is no tenderness.  Musculoskeletal: Normal range of motion.  Left groin area is tender to swelling and fluctuance  Neurological: He is alert and oriented to person, place, and time. No cranial nerve deficit.  Skin: Skin is warm and dry. No rash noted.  Psychiatric: Mood and affect normal.   LABORATORY PANEL:  Male CBC  Recent Labs Lab 02/22/17 0418  WBC 9.0  HGB 14.1  HCT 41.1  PLT 185   ------------------------------------------------------------------------------------------------------------------ Chemistries   Recent Labs Lab 02/18/17 0747  02/22/17 0418  NA  --   < > 136  K  --   < > 3.8  CL  --   < > 102  CO2  --   < > 26  GLUCOSE  --   < > 100*  BUN  --   < > 17  CREATININE  --   < > 0.84  CALCIUM  --   < > 8.9  MG 1.7  --   --   < > = values in this interval not displayed. RADIOLOGY:  No results found. ASSESSMENT AND PLAN:  41 y.o. male with a known history of DVT, pulmonary embolism who presents to the hospital due to vague left lower back pain and also numbness of his left lower extremity and noted to have an extensive left lower extremity DVT and also incidentally noted to be in a. Fib/flutter with exertional shortness of breath.   * New onset atrial fibrillation/flutter: Now converted to normal sinus rhythm.  Echo within normal limit. - on Coumadin with Lovenox bridging, INR is 1.67 today -Clinically stable -Patient  does not have insurance could not afford NOAC - Appreciate cardiology input.  - Negative serial troponins. - Neg CT scan of the chest for PE. -Try to maintain potassium at 4. Provide potassium supplements as needed  * History of extensive DVT-patient still has an extensive DVT in his left lower extremity. He status post IVC filter.  - started on Coumadin and Lovenox. - INR 0.9--0.86--0.99, 1.35 - 1.67 Coumadin per pharmacy management  *Left groin swelling-abscess Warm  compressors Symptomatic management Vancomycin per pharmacy Surgery consult placed for possible IND  * Shortness of breath - Likely due to atrial fibrillation/flutter improved denies any today,  All the records are reviewed and case discussed with Care Management/Social Worker. Management plans discussed with the patient, family and they are in agreement.  CODE STATUS: Full Code  TOTAL TIME TAKING CARE OF THIS PATIENT: 25 minutes.   More than 50% of the time was spent in counseling/coordination of care: YES  POSSIBLE D/C IN 1 DAYS after INR is therapeutic, DEPENDING ON CLINICAL CONDITION.  He will need INR between 2-3 and due to no Payor Source and noncompliance.  He will need to stay in the hospital to get fully anticoagulated.   Ramonita Lab M.D on 02/23/2017 at 1:32 PM  Between 7am to 6pm - Pager - [phone]  After 6pm go to [email or web address] - Scientist, research (life sciences) [facility]  Office  [phone]  CC: Primary care physician; No PCP Per Patient  Note: This dictation was prepared with Dragon dictation along with smaller phrase technology. Any transcriptional errors that result from this process are unintentional.

## 2017-02-23 NOTE — Consult Note (Signed)
Surgical Consultation  02/23/2017  Henry Dunn is an 41 y.o. male.   CC: Groin swelling  HPI: This patient with left groin swelling that started the day he came to the hospital a few days ago. He never noticed it before. It is worsening and causing him more pain. He denies any fevers or chills. He has never noticed anything like this before but his pain is worsening. A workup suggested an abscess.  Of strong significance is the fact that he has had a DVT in the past with an IVC filter and was noncompliant with his anticoagulation and he presented to the emergency room with another left leg DVT. He is undergoing full anticoagulation right now with a INR of 1.67.  Dr. Mauro Kaufmann who I spoke to personally sates that he cannot have his Coumadin stopped at this point. Additionally a patient was not nothing by mouth and 8 a double cheeseburger and fries at approximately 1600 today.  Past Medical History:  Diagnosis Date  . DVT (deep venous thrombosis) ([place])    a. In setting of dislocated hip, status post IVC filter, previously on Coumadin, noncompliant over the past 12 months  . Hx of blood clots    a. Patient denies any family history of clotting disorder, patient denies prior hypercoagulable workup  . PE (pulmonary thromboembolism) ([place])     Past Surgical History:  Procedure Laterality Date  . INSERTION OF VENA CAVA FILTER      Family History  Problem Relation Age of Onset  . Diabetes Neg Hx   . Hypertension Neg Hx     Social History:  reports that he has been smoking Cigarettes.  He has been smoking about 0.50 packs per day. He has never used smokeless tobacco. He reports that he drinks alcohol. His drug history is not on file.  Allergies: No Known Allergies  Medications reviewed.   Review of Systems:   Review of Systems  Constitutional: Negative for chills and fever.  HENT: Negative.   Eyes: Negative.   Respiratory: Negative.   Cardiovascular: Negative.   Gastrointestinal:  Negative.   Genitourinary: Negative.   Musculoskeletal: Positive for joint pain.  Skin: Negative.   Neurological: Negative.   Endo/Heme/Allergies: Negative.   Psychiatric/Behavioral: Negative.      Physical Exam:  BP (!) 103/59 (BP Location: Right Arm)   Pulse 72   Temp 98.3 F (36.8 C)   Resp 18   Ht 5' 8"  (1.727 m)   Wt 199 lb 6.4 oz (90.4 kg)   SpO2 95%   BMI 30.32 kg/m   Physical Exam  Constitutional: He is oriented to person, place, and time and well-developed, well-nourished, and in no distress. No distress.  HENT:  Head: Normocephalic and atraumatic.  Eyes: Pupils are equal, round, and reactive to light. Right eye exhibits no discharge. Left eye exhibits no discharge. No scleral icterus.  Neck: Normal range of motion.  Cardiovascular: Normal rate, regular rhythm and normal heart sounds.   Pulmonary/Chest: Effort normal. No respiratory distress. He has no wheezes.  Abdominal: Soft. He exhibits no distension. There is no tenderness.  Musculoskeletal: Normal range of motion. He exhibits edema, tenderness and deformity.  Left groin fluctuant erythematous tender area at the groin crease. This is lateral to the scrotum.  Lymphadenopathy:    He has no cervical adenopathy.  Neurological: He is alert and oriented to person, place, and time.  Skin: Skin is warm. He is not diaphoretic. There is erythema.  Fluctuant mass in the left  groin  Multiple tattoos  Vitals reviewed.     Results for orders placed or performed during the hospital encounter of 02/17/17 (from the past 48 hour(s))  Protime-INR     Status: Abnormal   Collection Time: 02/22/17  4:18 AM  Result Value Ref Range   Prothrombin Time 16.8 (H) 11.4 - 15.2 seconds   INR 1.35   CBC     Status: None   Collection Time: 02/22/17  4:18 AM  Result Value Ref Range   WBC 9.0 3.8 - 10.6 K/uL   RBC 4.67 4.40 - 5.90 MIL/uL   Hemoglobin 14.1 13.0 - 18.0 g/dL   HCT 41.1 40.0 - 52.0 %   MCV 88.0 80.0 - 100.0 fL    MCH 30.1 26.0 - 34.0 pg   MCHC 34.2 32.0 - 36.0 g/dL   RDW 13.4 11.5 - 14.5 %   Platelets 185 150 - 440 K/uL  Basic metabolic panel     Status: Abnormal   Collection Time: 02/22/17  4:18 AM  Result Value Ref Range   Sodium 136 135 - 145 mmol/L   Potassium 3.8 3.5 - 5.1 mmol/L   Chloride 102 101 - 111 mmol/L   CO2 26 22 - 32 mmol/L   Glucose, Bld 100 (H) 65 - 99 mg/dL   BUN 17 6 - 20 mg/dL   Creatinine, Ser 0.84 0.61 - 1.24 mg/dL   Calcium 8.9 8.9 - 10.3 mg/dL   GFR calc non Af Amer >60 >60 mL/min   GFR calc Af Amer >60 >60 mL/min    Comment: (NOTE) The eGFR has been calculated using the CKD EPI equation. This calculation has not been validated in all clinical situations. eGFR's persistently <60 mL/min signify possible Chronic Kidney Disease.    Anion gap 8 5 - 15  Protime-INR     Status: Abnormal   Collection Time: 02/23/17  4:04 AM  Result Value Ref Range   Prothrombin Time 19.9 (H) 11.4 - 15.2 seconds   INR 1.67    No results found.  Assessment/Plan:  This a patient with a left groin abscess. He is anticoagulated and is ramping up his INR in anticipation of discharge but now he has an abscess in the left groin. His internal medicine specialist states that he cannot have his warfarin held but he definitely needs incision and drainage of this rather large abscess. This will need to be done in the operating room. Patient was not made nothing by mouth and therefore 8 a double cheeseburger and fries right before I saw the patient this evening.  I discussed this patient with Dr. Mauro Kaufmann. His Coumadin cannot be held and will plan incision and drainage in the OR tomorrow where controlled circumstances can decrease his risk of bleeding. I did discuss with him his risk for bleeding and his risk for a worsening abscess and recurrence. The options were reviewed with him questions were answered for him Dr. Dahlia Byes will be performing the procedure tomorrow.  Florene Glen, MD, FACS

## 2017-02-23 NOTE — Progress Notes (Signed)
Pt called this RN into room complaining of a boil on his left groin, the top of the skin is rubbed off the area and it is a knot, pt does not want anything for pain. MD notified, Dr. Sheryle Hail put in orders for a warm compress if pt desires. Will continue to monitor. Shirley Friar, RN, BSN

## 2017-02-23 NOTE — Progress Notes (Signed)
Pharmacy Antibiotic Note  Henry Dunn is a 41 y.o. male admitted on 02/17/2017 with cellulitis with abscess.  Pharmacy has been consulted for vancomycin dosing.  Plan: Vancomycin 1.25 gm IV x 1 followed in approximately 6 hours (stacked dosing) by vancomycin 1.25 gm IV Q8H, predicted trough 17 mcg/mL. Pharmacy will continue to follow and adjust as needed to maintain trough 15 to 20 mcg/mL.   Vd 54 L, Ke 0.11 hr-1, T1/2 6.3 hr  Height: 5\' 8"  (172.7 cm) Weight: 199 lb 6.4 oz (90.4 kg) IBW/kg (Calculated) : 68.4  Temp (24hrs), Avg:98.1 F (36.7 C), Min:97.9 F (36.6 C), Max:98.3 F (36.8 C)   Recent Labs Lab 02/17/17 1233 02/19/17 0416 02/22/17 0418  WBC 10.4 7.6 9.0  CREATININE 0.85 1.15 0.84    Estimated Creatinine Clearance: 127.6 mL/min (by C-G formula based on SCr of 0.84 mg/dL).    No Known Allergies   Thank you for allowing pharmacy to be a part of this patient's care.  Carola Frost, Pharm.D., BCPS Clinical Pharmacist 02/23/2017 1:48 PM

## 2017-02-23 NOTE — Plan of Care (Signed)
Problem: Safety: Goal: Ability to remain free from injury will improve Outcome: Completed/Met Date Met: 02/23/17 Pt does not score high even for the bed alarm to be on. Non skid socks in place, encouraged to call for help if needed. Will continue to monitor. Conley Simmonds, RN, BSN   Problem: Tissue Perfusion: Goal: Risk factors for ineffective tissue perfusion will decrease Outcome: Progressing Subcutaneous lovenox given, pt is also taking oral coumadin.

## 2017-02-24 ENCOUNTER — Encounter
Admission: EM | Disposition: A | Discharge status: Home / Self Care | Payer: Self-pay | Source: Home / Self Care | Attending: Internal Medicine

## 2017-02-24 DIAGNOSIS — I824Z2 Acute embolism and thrombosis of unspecified deep veins of left distal lower extremity: Secondary | ICD-10-CM

## 2017-02-24 DIAGNOSIS — I4891 Unspecified atrial fibrillation: Secondary | ICD-10-CM

## 2017-02-24 DIAGNOSIS — M545 Low back pain: Secondary | ICD-10-CM

## 2017-02-24 LAB — PROTIME-INR
INR: 1.83
Prothrombin Time: 21.4 seconds — ABNORMAL HIGH (ref 11.4–15.2)

## 2017-02-24 LAB — CREATININE, SERUM
Creatinine, Ser: 0.73 mg/dL (ref 0.61–1.24)
GFR calc Af Amer: >60 mL/min (ref >60)
GFR calc non Af Amer: >60 mL/min (ref >60)

## 2017-02-24 LAB — VANCOMYCIN, TROUGH: Vancomycin Tr: 20 ug/mL (ref 15–20)

## 2017-02-24 SURGERY — INCISION AND DRAINAGE, ABSCESS
Anesthesia: Choice | Laterality: Left

## 2017-02-24 NOTE — Progress Notes (Signed)
Pharmacy Antibiotic Note  Henry Dunn is a 41 y.o. male admitted on 02/17/2017 with cellulitis with abscess.  Pharmacy has been consulted for vancomycin dosing.  Plan: Vancomycin 1.25 gm IV x 1 followed in approximately 6 hours (stacked dosing) by vancomycin 1.25 gm IV Q8H, predicted trough 17 mcg/mL. Pharmacy will continue to follow and adjust as needed to maintain trough 15 to 20 mcg/mL.   Vd 54 L, Ke 0.11 hr-1, T1/2 6.3 hr  3/28 VT @ 2122: 20 -- level was drawn 2 hours too early from when last dose was given, but 4th dose was also give 1.5 hours late. Predicted extrapolated trough 16 mcg/mL. Will draw next trough 3/29 @ 1300 to confirm.  Height: 5\' 8"  (172.7 cm) Weight: 199 lb 6.4 oz (90.4 kg) IBW/kg (Calculated) : 68.4  Temp (24hrs), Avg:98.1 F (36.7 C), Min:97.9 F (36.6 C), Max:98.3 F (36.8 C)   Last Labs    Recent Labs Lab 02/17/17 1233 02/19/17 0416 02/22/17 0418  WBC 10.4 7.6 9.0  CREATININE 0.85 1.15 0.84      Estimated Creatinine Clearance: 127.6 mL/min (by C-G formula based on SCr of 0.84 mg/dL).    No Known Allergies   Thank you for allowing pharmacy to be a part of this patient's care.  Thomasene Ripple, PharmD, BCPS Clinical Pharmacist 02/24/2017

## 2017-02-24 NOTE — Progress Notes (Signed)
Chaplain visited patient while rounding around the unit. Patient appreciated Chaplain visit, but he said he had no emotional or spiritual needs that needed to be addressed.   02/24/17 1400  Clinical Encounter Type  Visited With Patient  Visit Type Initial  Spiritual Encounters  Spiritual Needs Other (Comment)  ssed.

## 2017-02-24 NOTE — Progress Notes (Addendum)
ANTICOAGULATION CONSULT NOTE - follow up  Pharmacy Consult for warfarin Indication: DVT  No Known Allergies  Patient Measurements: Height: 5\' 8"  (172.7 cm) Weight: 199 lb 6.4 oz (90.4 kg) IBW/kg (Calculated) : 68.4  Vital Signs: Temp: 98 F (36.7 C) (03/28 0616) Temp Source: Oral (03/28 0616) BP: 99/55 (03/28 0616) Pulse Rate: 68 (03/28 0616)  Labs:  Recent Labs  02/22/17 0418 02/23/17 0404 02/24/17 0423  HGB 14.1  --   --   HCT 41.1  --   --   PLT 185  --   --   LABPROT 16.8* 19.9* 21.4*  INR 1.35 1.67 1.83  CREATININE 0.84  --  0.73    Estimated Creatinine Clearance: 134 mL/min (by C-G formula based on SCr of 0.73 mg/dL).   Medical History: Past Medical History:  Diagnosis Date  . DVT (deep venous thrombosis) (HCC)    a. In setting of dislocated hip, status post IVC filter, previously on Coumadin, noncompliant over the past 12 months  . Hx of blood clots    a. Patient denies any family history of clotting disorder, patient denies prior hypercoagulable workup  . PE (pulmonary thromboembolism) (HCC)     Medications:  Scheduled:  . enoxaparin (LOVENOX) injection  1 mg/kg Subcutaneous Q12H  . magnesium oxide  400 mg Oral Daily  . metoprolol tartrate  25 mg Oral BID  . sodium chloride flush  3 mL Intravenous Q12H  . vancomycin  1,250 mg Intravenous Q8H  . warfarin  7.5 mg Oral q1800  . Warfarin - Pharmacist Dosing Inpatient   Does not apply q1800    Assessment: Pharmacy consulted to dose and monitor warfarin in this 41 year old male diagnosed with extensive DVT on admission. Patient has a history of DVT and PE and is s/p IVC filter placement and was prescribed warfarin outpatient. However, patient reports noncompliance and that he stopped taking warfarin approximately one year ago.  This will be considered a new start warfarin.  Dosing history: Date INR Dose 3/21 0.97 5 mg 3/22 0.92 5 mg 3/23     0.92     5 mg 3/24     0.86     5 mg 3/25     0.99 10  mg 3/26 1.35 10 mg 3/27 1.67 7.5 mg 3/28 1.83  Goal of Therapy:  INR 2-3 Monitor platelets by anticoagulation protocol: Yes   Plan:  Will continue Warfarin 7.5 mg po daily and recheck INR tomorrow morning. Patient on Lovenox 1mg /kg q12h for bridging.  Will check INR daily and CBC at least every 3 days per protocol.   Addendum:  Patient counseled on 3/23.  Angelique Blonder [facility] Clinical Pharmacist 02/24/17 7:31 AM

## 2017-02-24 NOTE — Progress Notes (Signed)
Pt did not have abscess in left groin drained. Moist heat applied twice today. Pt reports some relief of pain at the site. A quarter size area noted in left groin. Pt reports area feels smaller and less pressure is present. I will continue to assess.

## 2017-02-24 NOTE — Progress Notes (Signed)
Patient Name: Henry Dunn Date of Encounter: 02/24/2017  Primary Cardiologist: New to [facility] - consult by [facility] Problem List     Principal Problem:   New onset atrial flutter [facility]) Active Problems:   Chronic deep vein thrombosis (DVT) (HCC)   Shortness of breath   Tobacco abuse   DVT (deep venous thrombosis) (HCC)   Cellulitis and abscess of leg     Subjective   Short episodes of Afib with RVR into the 160s in the evening of 3/27 while cleaning his groin abscess. Upon getting back in bed and resting, heart rate improved and he converted back to sinus rhythm. Received KCl repletion on 3/27. No bmet this AM. INR remains subtherapeutic at 1.83 this morning, on Lovenox to Coumadin bridge. Currently in sinus rhythm with heart rates in the 60s bpm. No chest pain or SOB. He is for I&D in the OR today.   Inpatient Medications    Scheduled Meds: . enoxaparin (LOVENOX) injection  1 mg/kg Subcutaneous Q12H  . magnesium oxide  400 mg Oral Daily  . metoprolol tartrate  25 mg Oral BID  . sodium chloride flush  3 mL Intravenous Q12H  . vancomycin  1,250 mg Intravenous Q8H  . warfarin  7.5 mg Oral q1800  . Warfarin - Pharmacist Dosing Inpatient   Does not apply q1800   Continuous Infusions:  PRN Meds: acetaminophen **OR** acetaminophen, ondansetron **OR** ondansetron (ZOFRAN) IV   Vital Signs    Vitals:   02/23/17 0751 02/23/17 1131 02/23/17 2026 02/24/17 0616  BP: 101/62 (!) 103/59 116/68 (!) 99/55  Pulse: (!) 58 72 81 68  Resp: 16 18 18 18   Temp: 97.9 F (36.6 C) 98.3 F (36.8 C) 98.3 F (36.8 C) 98 F (36.7 C)  TempSrc: Oral  Oral Oral  SpO2: 92% 95% 98% 95%  Weight:      Height:        Intake/Output Summary (Last 24 hours) at 02/24/17 0731 Last data filed at 02/23/17 2122  Gross per 24 hour  Intake              743 ml  Output                0 ml  Net              743 ml   Filed Weights   02/17/17 1220 02/17/17 2203  Weight: 200 lb (90.7 kg) 199 lb 6.4  oz (90.4 kg)    Physical Exam    GEN: Well nourished, well developed, in no acute distress.  HEENT: Grossly normal.  Neck: Supple, no JVD, carotid bruits, or masses. Cardiac: RRR, no murmurs, rubs, or gallops. No clubbing, cyanosis, edema.  Radials/DP/PT 2+ and equal bilaterally.  Respiratory:  Respirations regular and unlabored, clear to auscultation bilaterally. GI: Soft, nontender, nondistended, BS + x 4. MS: no deformity or atrophy. Skin: warm and dry, no rash. Neuro:  Strength and sensation are intact. Psych: AAOx3.  Normal affect.  Labs    CBC  Recent Labs  02/22/17 0418  WBC 9.0  HGB 14.1  HCT 41.1  MCV 88.0  PLT 185   Basic Metabolic Panel  Recent Labs  02/22/17 0418 02/24/17 0423  NA 136  --   K 3.8  --   CL 102  --   CO2 26  --   GLUCOSE 100*  --   BUN 17  --   CREATININE 0.84 0.73  CALCIUM 8.9  --  Liver Function Tests No results for input(s): AST, ALT, ALKPHOS, BILITOT, PROT, ALBUMIN in the last 72 hours. No results for input(s): LIPASE, AMYLASE in the last 72 hours. Cardiac Enzymes No results for input(s): CKTOTAL, CKMB, CKMBINDEX, TROPONINI in the last 72 hours. BNP Invalid input(s): POCBNP D-Dimer No results for input(s): DDIMER in the last 72 hours. Hemoglobin A1C No results for input(s): HGBA1C in the last 72 hours. Fasting Lipid Panel No results for input(s): CHOL, HDL, LDLCALC, TRIG, CHOLHDL, LDLDIRECT in the last 72 hours. Thyroid Function Tests No results for input(s): TSH, T4TOTAL, T3FREE, THYROIDAB in the last 72 hours.  Invalid input(s): FREET3  Telemetry    NSR, 70s bpm currently, short episodes of PAF with RVR in the evening of 3/27 - Personally Reviewed  ECG    n/a - Personally Reviewed  Radiology    No results found.  Cardiac Studies   TTE 02/18/2017: ------------------------------------------------------------------- Study Conclusions  - Left ventricle: The cavity size was mildly dilated. Wall   thickness  was normal. Systolic function was normal. The estimated   ejection fraction was in the range of 55% to 60%. Wall motion was   normal; there were no regional wall motion abnormalities. Left   ventricular diastolic function parameters were normal. - Left atrium: The atrium was mildly dilated.  Patient Profile     41 y.o. male with history of left lower extremity DVT with PE presumed to be in the setting of dislocated left hip in 2006 secondary to mechanical fall off porch while wrestling during a football game, long history of palpitations without prior workup, and ongoing tobacco abuse who presented to [facility] with left back pain as well as shortness of breath. He was noted to be in new onset atrial flutter/fib with RVR with variable AV block.   Assessment & Plan    1. New onset Afib/flutter with RVR: -Currently in sinus rhythm with heart rates in the 60s bpm -Likely exacerbated by groin abscess -Has had occasional episodes of Afib/flutter this admission, though quickly converts to sinus -Remains on rate control via Lopressor with BP precluding further titration -Currently on Lovenox to Coumadin bridge with INR still subtherapeutic at 1.83 -CHADS2VASc at least 1 (vascular disease) -Afib/flutter burden may decrease as his groin abscess resolves -If he continues to have arrhythmia he may need to be seen by EP for evaluation of ablation -Will need to ambulate prior to discharge to evaluate for controlled heart rate  2. Groin abscess: -He is for I&D in the OR today -If needed, could hold Coumadin and continue Lovenox with holding of AM dose prior to surgery, though would need to restart Lovenox to Coumadin bridge in this setting -Per surgery and IM  3. Right lower extremity DVT: -Status post IVC filter -Coumadin as above  Signed, Eula Listen, PA-C [facility] Pager: [phone] 02/24/2017, 7:31 AM  Attending Note Patient seen and examined, agree with detailed note above,  Patient  presentation and plan discussed on rounds.   Feels well, abscess drained on its own overnight going to the bathroom, squatting. Feels less full.    had atrial fibrillation/flutter when abscess was bleeding, now back in normal sinus rhythm On physical exam lungs clear, heart sounds regular, no JVP, abdomen soft nontender, no significant lower extremity edema  Lab work reviewed showing normal renal function  --- On Lovenox bridge, INR 1.8, on warfarin --- Metoprolol tartrate twice a day for rate and rhythm control Unable to advance the beta blocker given his low  blood pressure If he continues to have frequent episodes of arrhythmia as an outpatient, may need to start antiarrhythmic Spent significant time discussing abscess, reason for anticoagulation, risk and benefit  Greater than 50% was spent in counseling and coordination of care with patient Total encounter time 25 minutes or more   Signed: Dossie Arbour  M.D., Ph.D. [facility]

## 2017-02-24 NOTE — Care Management (Signed)
Patient changed his appointment to 4/6 at [facility]- Dr Sallee Lange since he did not discharge

## 2017-02-24 NOTE — Progress Notes (Signed)
[employer] - [place] at [facility]   PATIENT NAME: Henry Dunn    MR#:  [id number]  DATE OF BIRTH:  1976-05-03  SUBJECTIVE:  CHIEF COMPLAINT:   Chief Complaint  Patient presents with  . Back Pain  . Numbness    BL LE  Patient Had spontaneous drainage of left groin boil REVIEW OF SYSTEMS:  Review of Systems  Constitutional: Negative for chills, fever and weight loss.  HENT: Negative for nosebleeds and sore throat.   Eyes: Negative for blurred vision.  Respiratory: Positive for shortness of breath. Negative for cough and wheezing.   Cardiovascular: Negative for chest pain, orthopnea, leg swelling and PND.  Gastrointestinal: Negative for abdominal pain, constipation, diarrhea, heartburn, nausea and vomiting.  Genitourinary: Negative for dysuria and urgency.  Musculoskeletal: Negative for back pain.       Left groin swelling with pain  Skin: Negative for rash.  Neurological: Negative for dizziness, speech change, focal weakness and headaches.  Endo/Heme/Allergies: Does not bruise/bleed easily.  Psychiatric/Behavioral: Negative for depression.   DRUG ALLERGIES:  No Known Allergies VITALS:  Blood pressure 104/61, pulse 67, temperature 98 F (36.7 C), temperature source Oral, resp. rate 18, height 5\' 8"  (1.727 m), weight 90.4 kg (199 lb 6.4 oz), SpO2 96 %. PHYSICAL EXAMINATION:  Physical Exam  Constitutional: He is oriented to person, place, and time and well-developed, well-nourished, and in no distress.  HENT:  Head: Normocephalic and atraumatic.  Eyes: Conjunctivae and EOM are normal. Pupils are equal, round, and reactive to light.  Neck: Normal range of motion. Neck supple. No tracheal deviation present. No thyromegaly present.  Cardiovascular: Normal rate, regular rhythm and normal heart sounds.   Pulmonary/Chest: Effort normal and breath sounds normal. No respiratory distress. He has no wheezes. He exhibits no tenderness.  Abdominal: Soft. Bowel sounds  are normal. He exhibits no distension. There is no tenderness.  Musculoskeletal: Normal range of motion.  Left groin area is tender to swelling and fluctuance  Neurological: He is alert and oriented to person, place, and time. No cranial nerve deficit.  Skin: Skin is warm and dry. No rash noted.  Psychiatric: Mood and affect normal.   LABORATORY PANEL:  Male CBC  Recent Labs Lab 02/22/17 0418  WBC 9.0  HGB 14.1  HCT 41.1  PLT 185   ------------------------------------------------------------------------------------------------------------------ Chemistries   Recent Labs Lab 02/18/17 0747  02/22/17 0418 02/24/17 0423  NA  --   < > 136  --   K  --   < > 3.8  --   CL  --   < > 102  --   CO2  --   < > 26  --   GLUCOSE  --   < > 100*  --   BUN  --   < > 17  --   CREATININE  --   < > 0.84 0.73  CALCIUM  --   < > 8.9  --   MG 1.7  --   --   --   < > = values in this interval not displayed. RADIOLOGY:  No results found. ASSESSMENT AND PLAN:  41 y.o. male with a known history of DVT, pulmonary embolism who presents to the hospital due to vague left lower back pain and also numbness of his left lower extremity and noted to have an extensive left lower extremity DVT and also incidentally noted to be in a. Fib/flutter with exertional shortness of breath.   * New onset atrial  fibrillation/flutter: Now converted to normal sinus rhythm.  Echo within normal limit. - on Coumadin with Lovenox bridging, INR is 1.83 today -Clinically stable -Patient does not have insurance could not afford NOAC - Appreciate cardiology input.  - Negative serial troponins. - Neg CT scan of the chest for PE. -Try to maintain potassium at 4. Provide potassium supplements as needed  * History of extensive DVT-patient still has an extensive DVT in his left lower extremity. He status post IVC filter.  - started on Coumadin and Lovenox. - INR 0.9--0.86--0.99, 1.35 - 1.67--1.83 Coumadin per pharmacy  management  *Left groin swelling-abscess Warm compressors, spontaneous drainage noticed Symptomatic management Vancomycin per pharmacy Follow-up with surgery if needed will do IND     All the records are reviewed and case discussed with Care Management/Social Worker. Management plans discussed with the patient, family and they are in agreement.  CODE STATUS: Full Code  TOTAL TIME TAKING CARE OF THIS PATIENT: 25 minutes.   More than 50% of the time was spent in counseling/coordination of care: YES  POSSIBLE D/C IN 1 DAYS after INR is therapeutic, DEPENDING ON CLINICAL CONDITION.  He will need INR between 2-3 and due to no Payor Source and noncompliance.  He will need to stay in the hospital to get fully anticoagulated.   Ramonita Lab M.D on 02/24/2017 at 4:16 PM  Between 7am to 6pm - Pager - [phone]  After 6pm go to [email or web address] - Scientist, research (life sciences) [facility]  Office  [phone]  CC: Primary care physician; No PCP Per Patient  Note: This dictation was prepared with Dragon dictation along with smaller phrase technology. Any transcriptional errors that result from this process are unintentional.

## 2017-02-24 NOTE — Progress Notes (Signed)
CC: Left groin abscess Subjective: Abscess drain spontaneously early this am. He feels better and pain has improved. AVSS  Objective: Vital signs in last 24 hours: Temp:  [98 F (36.7 C)-98.3 F (36.8 C)] 98 F (36.7 C) (03/28 1155) Pulse Rate:  [67-81] 67 (03/28 1155) Resp:  [18] 18 (03/28 1155) BP: (99-131)/(55-68) 104/61 (03/28 1155) SpO2:  [95 %-98 %] 96 % (03/28 1155) Last BM Date: 02/23/17  Intake/Output from previous day: 03/27 0701 - 03/28 0700 In: 743 [P.O.:240; I.V.:3; IV Piggyback:500] Out: -  Intake/Output this shift: No intake/output data recorded.  Physical exam:NAD, non toxic Abd: soft, NT Ext: well perfused, no edema Skin: there is some induration and erythema on left inner thigh,  Abscess has drained, no evidence of necrotizing infection Lab Results: CBC   Recent Labs  02/22/17 0418  WBC 9.0  HGB 14.1  HCT 41.1  PLT 185   BMET  Recent Labs  02/22/17 0418 02/24/17 0423  NA 136  --   K 3.8  --   CL 102  --   CO2 26  --   GLUCOSE 100*  --   BUN 17  --   CREATININE 0.84 0.73  CALCIUM 8.9  --    PT/INR  Recent Labs  02/23/17 0404 02/24/17 0423  LABPROT 19.9* 21.4*  INR 1.67 1.83   ABG No results for input(s): PHART, HCO3 in the last 72 hours.  Invalid input(s): PCO2, PO2  Studies/Results: No results found.  Anti-infectives: Anti-infectives    Start     Dose/Rate Route Frequency Ordered Stop   02/23/17 2200  vancomycin (VANCOCIN) 1,250 mg in sodium chloride 0.9 % 250 mL IVPB     1,250 mg 166.7 mL/hr over 90 Minutes Intravenous Every 8 hours 02/23/17 1348     02/23/17 1400  vancomycin (VANCOCIN) 1,250 mg in sodium chloride 0.9 % 250 mL IVPB     1,250 mg 166.7 mL/hr over 90 Minutes Intravenous  Once 02/23/17 1348 02/23/17 1542      Assessment/Plan:  Abscess spontaneously drained, we will follow him clinically. IF he worsens may still need I/D Hold off I/D for now since he has improved  Sterling Big, MD,  [facility]  02/24/2017

## 2017-02-25 ENCOUNTER — Telehealth: Payer: Self-pay | Admitting: Cardiovascular Disease

## 2017-02-25 LAB — CBC
HCT: 40.1 % (ref 40.0–52.0)
Hemoglobin: 14.1 g/dL (ref 13.0–18.0)
MCH: 31.3 pg (ref 26.0–34.0)
MCHC: 35.1 g/dL (ref 32.0–36.0)
MCV: 89.1 fL (ref 80.0–100.0)
Platelets: 203 10*3/uL (ref 150–440)
RBC: 4.5 MIL/uL (ref 4.40–5.90)
RDW: 13.5 % (ref 11.5–14.5)
WBC: 6.4 10*3/uL (ref 3.8–10.6)

## 2017-02-25 LAB — PROTIME-INR
INR: 2.12
Prothrombin Time: 24.1 seconds — ABNORMAL HIGH (ref 11.4–15.2)

## 2017-02-25 MED ORDER — MAGNESIUM OXIDE 400 (241.3 MG) MG PO TABS: 400.0000 mg | ORAL_TABLET | Freq: Every day | ORAL | 0 refills | Status: DC

## 2017-02-25 MED ORDER — METOPROLOL TARTRATE 25 MG PO TABS: 25.0000 mg | ORAL_TABLET | Freq: Two times a day (BID) | ORAL | 0 refills | Status: DC

## 2017-02-25 MED ORDER — DOXYCYCLINE HYCLATE 50 MG PO CAPS: 100.0000 mg | ORAL_CAPSULE | Freq: Two times a day (BID) | ORAL | 0 refills | Status: DC

## 2017-02-25 MED ORDER — WARFARIN SODIUM 6 MG PO TABS: 6.0000 mg | ORAL_TABLET | Freq: Every day | ORAL | 0 refills | Status: DC

## 2017-02-25 NOTE — Care Management (Signed)
Patient for discharge home today.  he verbalizes understanding of the importance of medication compliance and keeping appointments.  He verbalizes understanding to [facility] His follow up  appt offices are closed tomorrow so he will return to [facility] tomorrow for outpatient INR and results will be called to Dr Amado Coe.  Patient verbalizes full understanding of this plan

## 2017-02-25 NOTE — Telephone Encounter (Signed)
TCM.... Pt was seen in hospital and is being discharged  Please call

## 2017-02-25 NOTE — Plan of Care (Signed)
Problem: Pain Managment: Goal: General experience of comfort will improve Outcome: Completed/Met Date Met: 02/25/17 No complaints of pain this shift, will continue to monitor.   Problem: Physical Regulation: Goal: Will remain free from infection Outcome: Progressing IV abx for abscess to left groin  Problem: Cardiac: Goal: Ability to achieve and maintain adequate cardiopulmonary perfusion will improve Outcome: Completed/Met Date Met: 02/25/17 Remained NSR this shift. Scheduled PO metoprolol given

## 2017-02-25 NOTE — Telephone Encounter (Signed)
Patient contacted regarding discharge from [facility] on 02/25/17.  Patient understands to follow up with Dr. Mariah Milling on 03/12/17 at 10:20 at [facility]. Patient understands discharge instructions? yes Patient understands medications and regiment? yes Patient understands to bring all medications to this visit? yes

## 2017-02-25 NOTE — Discharge Summary (Addendum)
*A I likely to be poor. 41-year-old gentleman currently doing well and I ordered PT is to make sure everything is okay if home health can do that'll make sure that he get PET scan as soon as possible. And other issue is a diabetic coordinator diabetes. I need to diabetic coordinator U full than yesterday I gave them in origin, no blood was yesterday that was yesterday I need to talk to the medicines [facility] [place] [facility]   PATIENT NAME: Henry Dunn    MR#:  [id number]  DATE OF BIRTH:  Mar 11, 1976  DATE OF ADMISSION:  02/17/2017 ADMITTING PHYSICIAN: Houston Siren, MD  DATE OF DISCHARGE: 02/25/2017  PRIMARY CARE PHYSICIAN: No PCP Per Patient    ADMISSION DIAGNOSIS:  Swelling [R60.9] New onset a-fib (HCC) [I48.91] Atrial fibrillation with RVR (HCC) [I48.91] Acute deep vein thrombosis (DVT) of distal end of left lower extremity (HCC) [I82.4Z2] Acute bilateral low back pain, with sciatica presence unspecified [M54.5]  DISCHARGE DIAGNOSIS:  Atrial fibrillation with RVR Groin abscess SECONDARY DIAGNOSIS:   Past Medical History:  Diagnosis Date  . DVT (deep venous thrombosis) (HCC)    a. In setting of dislocated hip, status post IVC filter, previously on Coumadin, noncompliant over the past 12 months  . Hx of blood clots    a. Patient denies any family history of clotting disorder, patient denies prior hypercoagulable workup  . PE (pulmonary thromboembolism) [facility])     HOSPITAL COURSE:  HPI Sasan Wilkie  is a 41 y.o. male with a known history of DVT, pulmonary embolism who presents to the hospital due to vague left lower back pain and also numbness of his left lower extremity and noted to have an extensive left lower extremity DVT. Patient has a previous history of DVT and is status post IVC filter is also supposed to be on Coumadin but has not taken it in years. His CT chest with contrast does not show any evidence of pulmonary embolism today. He is  also complaining of some shortness of breath and exertion which has been progressively worse over the past few days. In the emergency room patient was noted to be in atrial fibrillation/flutter and therefore hospitalist services were contacted further treatment and evaluation. Patient denies any chest pain, nausea, vomiting, abdominal pain, palpitations, syncope or any other associated symptoms presently   Hospital course   * New onset atrial fibrillation/flutter: Now converted to normal sinus rhythm.  Echo within normal limit. - on Coumadin with Lovenox bridging, INR is 2.12  today, will discharge patient with the by mouth Coumadin Patient is to follow-up with Dr. Marylou Flesher office tomorrow for INR check and further Coumadin management as needed Discontinue Lovenox, discussed with cardiology they're agreeable -Clinically stable continue metoprolol for rate control -Patient does not have insurance could not afford NOAC - Appreciate cardiology input.  - Negative serial troponins. - Neg CT scan of the chest for PE. -Try to maintain potassium at 4. Provide potassium supplements as needed  * History of extensive DVT-patient still has an extensive DVT in his left lower extremity. He status post IVC filter.  - started on Coumadin and Lovenox for bridging INR is therapeutic discontinued Lovenox - INR 0.9--0.86--0.99, 1.35 - 1.67--1.83 --2.12 Will discharge patient with the by mouth Coumadin 6 mg, repeat PT/INR tomorrow on March 30 and please page results to Dr. Amado Coe for further management as patient is started on doxycycline, close monitoring is needed, okay to discharge patient from Dr. end  point no need to bridge pt for another 24 hours  *Left groin swelling-abscess Warm compressors, spontaneous drainage noticed Symptomatic management Vancomycin per pharmacy given during the hospital course. Clinically improving,  discharge patient with doxycycycline  100 mg by mouth twice a day for 10 days No  plans for IND for surgery, okay to discharge patient from surgery standpoint      DISCHARGE CONDITIONS:   Stable  CONSULTS OBTAINED:  Treatment Team:  Antonieta Iba, MD   PROCEDURES None  DRUG ALLERGIES:  No Known Allergies  DISCHARGE MEDICATIONS:   Current Discharge Medication List    START taking these medications   Details  doxycycline (VIBRAMYCIN) 50 MG capsule Take 2 capsules (100 mg total) by mouth 2 (two) times daily. Qty: 20 capsule, Refills: 0    magnesium oxide (MAG-OX) 400 (241.3 Mg) MG tablet Take 1 tablet (400 mg total) by mouth daily. Qty: 15 tablet, Refills: 0    metoprolol tartrate (LOPRESSOR) 25 MG tablet Take 1 tablet (25 mg total) by mouth 2 (two) times daily. Qty: 60 tablet, Refills: 0    warfarin (COUMADIN) 6 MG tablet Take 1 tablet (6 mg total) by mouth daily at 6 PM. Qty: 7 tablet, Refills: 0      CONTINUE these medications which have NOT CHANGED   Details  acetaminophen (TYLENOL) 500 MG tablet Take 500 mg by mouth every 6 (six) hours as needed.      STOP taking these medications     clindamycin (CLEOCIN) 150 MG capsule      ibuprofen (ADVIL,MOTRIN) 800 MG tablet      ketorolac (TORADOL) 10 MG tablet      penicillin v potassium (VEETID) 500 MG tablet          DISCHARGE INSTRUCTIONS:  Follow-up with primary care physician on Monday, April 2 for repeat PT/INR and further Coumadin management  Follow-up with cardiology Dr. Lewie Loron in a week,  Patient will get outpatient PT/INR check on March 30, results will be Paged to Dr. Amado Coe , patient is to stay in the hospital until further Coumadin management plans discussed with patient by Dr. Amado Coe on March 30 after reviewing the results  until Follow-up with primary care physician in 3-4 days    DIET:  Cardiac diet  DISCHARGE CONDITION:  Stable  ACTIVITY:  Activity as tolerated  OXYGEN:  Home Oxygen: No.   Oxygen Delivery: room air  DISCHARGE LOCATION:  home   If you  experience worsening of your admission symptoms, develop shortness of breath, life threatening emergency, suicidal or homicidal thoughts you must seek medical attention immediately by calling 911 or calling your MD immediately  if symptoms less severe.  You Must read complete instructions/literature along with all the possible adverse reactions/side effects for all the Medicines you take and that have been prescribed to you. Take any new Medicines after you have completely understood and accpet all the possible adverse reactions/side effects.   Please note  You were cared for by a hospitalist during your hospital stay. If you have any questions about your discharge medications or the care you received while you were in the hospital after you are discharged, you can call the unit and asked to speak with the hospitalist on call if the hospitalist that took care of you is not available. Once you are discharged, your primary care physician will handle any further medical issues. Please note that NO REFILLS for any discharge medications will be authorized once you are discharged, as it  is imperative that you return to your primary care physician (or establish a relationship with a primary care physician if you do not have one) for your aftercare needs so that they can reassess your need for medications and monitor your lab values.     Today  Chief Complaint  Patient presents with  . Back Pain  . Numbness    BL LE    Patient is doing fine no complaints, groin swelling significantly improved ROS:  CONSTITUTIONAL: Denies fevers, chills. Denies any fatigue, weakness.  EYES: Denies blurry vision, double vision, eye pain. EARS, NOSE, THROAT: Denies tinnitus, ear pain, hearing loss. RESPIRATORY: Denies cough, wheeze, shortness of breath.  CARDIOVASCULAR: Denies chest pain, palpitations, edema.  GASTROINTESTINAL: Denies nausea, vomiting, diarrhea, abdominal pain. Denies bright red blood per  rectum. GENITOURINARY: Denies dysuria, hematuria. ENDOCRINE: Denies nocturia or thyroid problems. HEMATOLOGIC AND LYMPHATIC: Denies easy bruising or bleeding. SKIN: Denies rash or lesion. MUSCULOSKELETAL: Left groin area swelling and redness significantly improved Denies pain in neck, back, shoulder, knees, hips or arthritic symptoms.  NEUROLOGIC: Denies paralysis, paresthesias.  PSYCHIATRIC: Denies anxiety or depressive symptoms.   VITAL SIGNS:  Blood pressure 116/69, pulse 63, temperature 97.5 F (36.4 C), temperature source Axillary, resp. rate 16, height 5\' 8"  (1.727 m), weight 90.4 kg (199 lb 6.4 oz), SpO2 97 %.  I/O:    Intake/Output Summary (Last 24 hours) at 02/25/17 1157 Last data filed at 02/25/17 1013  Gross per 24 hour  Intake             1233 ml  Output                0 ml  Net             1233 ml    PHYSICAL EXAMINATION:  GENERAL:  41 y.o.-year-old patient lying in the bed with no acute distress.  EYES: Pupils equal, round, reactive to light and accommodation. No scleral icterus. Extraocular muscles intact.  HEENT: Head atraumatic, normocephalic. Oropharynx and nasopharynx clear.  NECK:  Supple, no jugular venous distention. No thyroid enlargement, no tenderness.  LUNGS: Normal breath sounds bilaterally, no wheezing, rales,rhonchi or crepitation. No use of accessory muscles of respiration.  CARDIOVASCULAR: S1, S2 normal. No murmurs, rubs, or gallops.  ABDOMEN: Soft, non-tender, non-distended. Bowel sounds present. No organomegaly or mass.  EXTREMITIES: Left groin erythema and tenderness significantly improved status post spontaneous drainage of the abscess  No pedal edema, cyanosis, or clubbing.  NEUROLOGIC: Cranial nerves II through XII are intact. Muscle strength 5/5 in all extremities. Sensation intact. Gait not checked.  PSYCHIATRIC: The patient is alert and oriented x 3.  SKIN: No obvious rash, lesion, or ulcer.   DATA REVIEW:   CBC  Recent Labs Lab  02/25/17 0422  WBC 6.4  HGB 14.1  HCT 40.1  PLT 203    Chemistries   Recent Labs Lab 02/22/17 0418 02/24/17 0423  NA 136  --   K 3.8  --   CL 102  --   CO2 26  --   GLUCOSE 100*  --   BUN 17  --   CREATININE 0.84 0.73  CALCIUM 8.9  --     Cardiac Enzymes No results for input(s): TROPONINI in the last 168 hours.  Microbiology Results  No results found for this or any previous visit.  RADIOLOGY:  No results found.  EKG:   Orders placed or performed during the hospital encounter of 02/17/17  . ED EKG  .  ED EKG  . EKG 12-Lead  . EKG 12-Lead  . ED EKG  . ED EKG  . EKG 12-Lead  . EKG 12-Lead  . EKG 12-Lead  . EKG 12-Lead      Management plans discussed with the patient, family and they are in agreement.  CODE STATUS:     Code Status Orders        Start     Ordered   02/17/17 2210  Full code  Continuous     02/17/17 2209    Code Status History    Date Active Date Inactive Code Status Order ID Comments User Context   This patient has a current code status but no historical code status.      TOTAL TIME TAKING CARE OF THIS PATIENT: 45 minutes.   Note: This dictation was prepared with Dragon dictation along with smaller phrase technology. Any transcriptional errors that result from this process are unintentional.   @MEC @  on 02/25/2017 at 11:57 AM  Between 7am to 6pm - Pager - [phone]  After 6pm go to [email or web address] - password [email or web address] [facility]  [place] [facility]  Office  [phone]  CC: Primary care physician; No PCP Per Patient

## 2017-02-25 NOTE — Progress Notes (Signed)
ANTICOAGULATION CONSULT NOTE - follow up  Pharmacy Consult for warfarin Indication: DVT  No Known Allergies  Patient Measurements: Height: 5\' 8"  (172.7 cm) Weight: 199 lb 6.4 oz (90.4 kg) IBW/kg (Calculated) : 68.4  Vital Signs: Temp: 98 F (36.7 C) (03/28 0616) Temp Source: Oral (03/28 0616) BP: 99/55 (03/28 0616) Pulse Rate: 68 (03/28 0616)  Labs:  Recent Labs (last 2 labs)    Recent Labs  02/22/17 0418 02/23/17 0404 02/24/17 0423  HGB 14.1  --   --   HCT 41.1  --   --   PLT 185  --   --   LABPROT 16.8* 19.9* 21.4*  INR 1.35 1.67 1.83  CREATININE 0.84  --  0.73      Estimated Creatinine Clearance: 134 mL/min (by C-G formula based on SCr of 0.73 mg/dL).   Medical History:     Past Medical History:  Diagnosis Date  . DVT (deep venous thrombosis) (HCC)    a. In setting of dislocated hip, status post IVC filter, previously on Coumadin, noncompliant over the past 12 months  . Hx of blood clots    a. Patient denies any family history of clotting disorder, patient denies prior hypercoagulable workup  . PE (pulmonary thromboembolism) (HCC)     Medications:  Scheduled:  . enoxaparin (LOVENOX) injection  1 mg/kg Subcutaneous Q12H  . magnesium oxide  400 mg Oral Daily  . metoprolol tartrate  25 mg Oral BID  . sodium chloride flush  3 mL Intravenous Q12H  . vancomycin  1,250 mg Intravenous Q8H  . warfarin  7.5 mg Oral q1800  . Warfarin - Pharmacist Dosing Inpatient   Does not apply q1800    Assessment: Pharmacy consulted to dose and monitor warfarin in this 41 year old male diagnosed with extensive DVT on admission. Patient has a history of DVT and PE and is s/p IVC filter placement and was prescribed warfarin outpatient. However, patient reports noncompliance and that he stopped taking warfarin approximately one year ago.  This will be considered a new start warfarin.  Dosing history: Date    INR      Dose 3/21     0.97     5 mg 3/22      0.92     5 mg 3/23     0.92     5 mg 3/24     0.86     5 mg 3/25     0.99     10 mg 3/26     1.35     10 mg 3/27     1.67     7.5 mg 3/28     1.83   7.5 mg 3/29     2.12  Goal of Therapy:  INR 2-3 Monitor platelets by anticoagulation protocol: Yes   Plan:  Will continue Warfarin 7.5 mg po daily and recheck INR tomorrow morning. Patient on Lovenox 1mg /kg q12h for bridging.  3/29 @ 0500 INR 2.12 therapeutic. Will check INR on 3/30 @ 0500 and if therapeutic will d/c lovenox bridge after 2nd therapeutic INR. Will continue current dose of warfarin 7.5 mg, will continue to monitor INR and CBC.  Thank you for this consult.  Thomasene Ripple, PharmD, BCPS Clinical Pharmacist 02/25/2017

## 2017-02-25 NOTE — Progress Notes (Signed)
   No further episodes of arrhythmia. INR therapeutic. Currently with sinus bradycardia while sleeping. Unable to advance rate control. Needs cardiology follow in 10-14 days. If has recurrent arrhythmia consider AAT vs EP evaluation for ablation.

## 2017-02-25 NOTE — Telephone Encounter (Signed)
Pt has not been discharged yet.

## 2017-02-25 NOTE — Progress Notes (Signed)
Pt discharged to home via wc.  Instructions and rx given to pt.  Questions answered.  No distress.  

## 2017-02-25 NOTE — Discharge Instructions (Signed)
Follow-up with primary care physician on Monday, April 2 for repeat PT/INR and further Coumadin management Follow-up with cardiology Dr. Lewie Loron in a week Follow-up with primary care physician in 3-4 days

## 2017-02-25 NOTE — Progress Notes (Signed)
CC: thigh abscess Subjective: ReSolving significantly improvement over last 48 hours. No fevers.  Objective: Vital signs in last 24 hours: Temp:  [97.5 F (36.4 C)-98 F (36.7 C)] 97.5 F (36.4 C) (03/29 0840) Pulse Rate:  [58-78] 63 (03/29 0840) Resp:  [16-18] 16 (03/29 0840) BP: (100-131)/(51-70) 116/69 (03/29 0840) SpO2:  [96 %-99 %] 97 % (03/29 0840) Last BM Date: 02/24/17  Intake/Output from previous day: 03/28 0701 - 03/29 0700 In: 753 [I.V.:3; IV Piggyback:750] Out: -  Intake/Output this shift: Total I/O In: 480 [P.O.:480] Out: -   Physical exam: NAd, alert, non toxic Abd: soft, NT Ext: well perfused and warm Skin: left inner thigh w induration , resolving abscess and cellulitis, no necrosis  Lab Results: CBC   Recent Labs  02/25/17 0422  WBC 6.4  HGB 14.1  HCT 40.1  PLT 203   BMET  Recent Labs  02/24/17 0423  CREATININE 0.73   PT/INR  Recent Labs  02/24/17 0423 02/25/17 0422  LABPROT 21.4* 24.1*  INR 1.83 2.12   ABG No results for input(s): PHART, HCO3 in the last 72 hours.  Invalid input(s): PCO2, PO2  Studies/Results: No results found.  Anti-infectives: Anti-infectives    Start     Dose/Rate Route Frequency Ordered Stop   02/23/17 2200  vancomycin (VANCOCIN) 1,250 mg in sodium chloride 0.9 % 250 mL IVPB     1,250 mg 166.7 mL/hr over 90 Minutes Intravenous Every 8 hours 02/23/17 1348     02/23/17 1400  vancomycin (VANCOCIN) 1,250 mg in sodium chloride 0.9 % 250 mL IVPB     1,250 mg 166.7 mL/hr over 90 Minutes Intravenous  Once 02/23/17 1348 02/23/17 1542      Assessment/Plan: Resolving abscess continue A/Bs as out pt for 10 more days No surgical intervention May DC from surgical perspective Sterling Big, MD, FACS  02/25/2017

## 2017-02-26 ENCOUNTER — Other Ambulatory Visit
Admission: RE | Admit: 2017-02-26 | Discharge: 2017-02-26 | Disposition: A | Discharge status: Home / Self Care | Payer: Self-pay | Source: Ambulatory Visit | Attending: Internal Medicine | Admitting: Internal Medicine

## 2017-02-26 DIAGNOSIS — I4891 Unspecified atrial fibrillation: Secondary | ICD-10-CM | POA: Insufficient documentation

## 2017-02-26 LAB — PROTIME-INR
INR: 2.17
Prothrombin Time: 24.5 seconds — ABNORMAL HIGH (ref 11.4–15.2)

## 2017-03-11 NOTE — Progress Notes (Deleted)
Cardiology Office Note  Date:  03/11/2017   ID:  Henry Dunn, DOB 17-Mar-1976, MRN [id number]  PCP:  No PCP Per Patient   No chief complaint on file.   HPI:  41 year old gentleman  Previously diagnosed with right lower extremity DVT in the past, had IVC filter Chronic smoker, shortness of breath seen in the hospital by our group after he developed atrial fibrillation with RVR February 23 2017 after having groin abscess debrided, started on warfarin He presents today for routine follow-up of his atrial fibrillation  Was noted to be in atrial fibrillation/flutter Converted to normal sinus rhythm in the hospital In the hospital had paroxysmal A. fib/flutter, started on metoprolol CHADS2VASc at least 1 (vascular disease)  Echocardiogram 02/18/2017 - Left ventricle: The cavity size was mildly dilated. Wall   thickness was normal. Systolic function was normal. The estimated   ejection fraction was in the range of 55% to 60%. Wall motion was   normal; there were no regional wall motion abnormalities. Left   ventricular diastolic function parameters were normal. - Left atrium: The atrium was mildly dilated.    PMH:   has a past medical history of DVT (deep venous thrombosis) (HCC); blood clots; and PE (pulmonary thromboembolism) (HCC).  PSH:    Past Surgical History:  Procedure Laterality Date  . INSERTION OF VENA CAVA FILTER      Current Outpatient Prescriptions  Medication Sig Dispense Refill  . acetaminophen (TYLENOL) 500 MG tablet Take 500 mg by mouth every 6 (six) hours as needed.    . doxycycline (VIBRAMYCIN) 50 MG capsule Take 2 capsules (100 mg total) by mouth 2 (two) times daily. 20 capsule 0  . magnesium oxide (MAG-OX) 400 (241.3 Mg) MG tablet Take 1 tablet (400 mg total) by mouth daily. 15 tablet 0  . metoprolol tartrate (LOPRESSOR) 25 MG tablet Take 1 tablet (25 mg total) by mouth 2 (two) times daily. 60 tablet 0  . warfarin (COUMADIN) 6 MG tablet Take 1 tablet (6 mg  total) by mouth daily at 6 PM. 7 tablet 0   No current facility-administered medications for this visit.      Allergies:   Patient has no known allergies.   Social History:  The patient  reports that he has been smoking Cigarettes.  He has been smoking about 0.50 packs per day. He has never used smokeless tobacco. He reports that he drinks alcohol.   Family History:   family history is not on file.    Review of Systems: ROS   PHYSICAL EXAM: VS:  There were no vitals taken for this visit. , BMI There is no height or weight on file to calculate BMI. GEN: Well nourished, well developed, in no acute distress HEENT: normal Neck: no JVD, carotid bruits, or masses Cardiac: RRR; no murmurs, rubs, or gallops,no edema  Respiratory:  clear to auscultation bilaterally, normal work of breathing GI: soft, nontender, nondistended, + BS MS: no deformity or atrophy Skin: warm and dry, no rash Neuro:  Strength and sensation are intact Psych: euthymic mood, full affect    Recent Labs: 02/18/2017: Magnesium 1.7; TSH 3.702 02/22/2017: BUN 17; Potassium 3.8; Sodium 136 02/24/2017: Creatinine, Ser 0.73 02/25/2017: Hemoglobin 14.1; Platelets 203    Lipid Panel No results found for: CHOL, HDL, LDLCALC, TRIG    Wt Readings from Last 3 Encounters:  02/17/17 199 lb 6.4 oz (90.4 kg)  07/24/15 190 lb (86.2 kg)  05/20/15 190 lb (86.2 kg)  ASSESSMENT AND PLAN:  No diagnosis found.   Disposition:   F/U  6 months  No orders of the defined types were placed in this encounter.    Signed, Dossie Arbour, M.D., Ph.D. 03/11/2017  [facility] [place], [place] [phone]

## 2017-03-12 ENCOUNTER — Ambulatory Visit: Payer: Self-pay | Admitting: Cardiovascular Disease

## 2017-03-24 NOTE — Progress Notes (Addendum)
Cardiology Office Note  Date:  03/25/2017   ID:  Henry Dunn, DOB [birth year], MRN [id number]  PCP:  No PCP Per Patient   Chief Complaint  Patient presents with  . other    Afib. Meds reviewed verbally with pt.    HPI:  41 year old gentleman  Previously diagnosed with right lower extremity DVT in the past, had IVC filter Chronic smoker, shortness of breath developed atrial fibrillation with RVR February 23 2017 after having groin abscess debrided, started on warfarin He presents today for routine follow-up of his atrial fibrillation  Was noted to be in atrial fibrillation/flutter Converted to normal sinus rhythm in the hospital In the hospital had paroxysmal A. fib/flutter, started on metoprolol CHADS2VASc at least 1 (vascular disease)  Echocardiogram 02/18/2017 - Left ventricle: The cavity size was mildly dilated. The estimated ejection fraction was in the range of 55% to 60%. Wall motion was normal - Left atrium: The atrium was mildly dilated.  He feels well today, less fluttering when he takes his metoprolol Scheduled to see medical management next week for assistance with his medications He has been seen at [facility] for management of his warfarin Scheduled tomorrow for INR check No significant leg swelling, no shortness of breath or chest pain  Shows normal sinus rhythm with rate 73 bpm no significant ST or T-wave changesEKG personally reviewed by myself on todays visit   PMH:   has a past medical history of DVT (deep venous thrombosis) (HCC); blood clots; and PE (pulmonary thromboembolism) (HCC).  PSH:    Past Surgical History:  Procedure Laterality Date  . INSERTION OF VENA CAVA FILTER      Current Outpatient Prescriptions  Medication Sig Dispense Refill  . metoprolol tartrate (LOPRESSOR) 25 MG tablet Take 1 tablet (25 mg total) by mouth 2 (two) times daily. 60 tablet 0  . warfarin (COUMADIN) 6 MG tablet Take 1 tablet (6 mg total) by mouth daily at 6 PM. 7 tablet  0   No current facility-administered medications for this visit.      Allergies:   Patient has no known allergies.   Social History:  The patient  reports that he has been smoking Cigarettes.  He has been smoking about 0.50 packs per day. He has never used smokeless tobacco. He reports that he drinks alcohol.   Family History:   family history is not on file.    Review of Systems: Review of Systems  Constitutional: Negative.   Respiratory: Negative.   Cardiovascular: Negative.   Gastrointestinal: Negative.   Musculoskeletal: Negative.   Neurological: Negative.   Psychiatric/Behavioral: Negative.   All other systems reviewed and are negative.    PHYSICAL EXAM: VS:  BP 126/84 (BP Location: Left Arm, Patient Position: Sitting, Cuff Size: Normal)   Pulse 73   Ht 5\' 8"  (1.727 m)   Wt 201 lb 12 oz (91.5 kg)   BMI 30.68 kg/m  , BMI Body mass index is 30.68 kg/m. GEN: Well nourished, well developed, in no acute distress  HEENT: normal  Neck: no JVD, carotid bruits, or masses Cardiac: RRR; no murmurs, rubs, or gallops,no edema  Respiratory:  clear to auscultation bilaterally, normal work of breathing GI: soft, nontender, nondistended, + BS MS: no deformity or atrophy  Skin: warm and dry, no rash Neuro:  Strength and sensation are intact Psych: euthymic mood, full affect    Recent Labs: 02/18/2017: Magnesium 1.7; TSH 3.702 02/22/2017: BUN 17; Potassium 3.8; Sodium 136 02/24/2017: Creatinine, Ser 0.73 02/25/2017: Hemoglobin  14.1; Platelets 203    Lipid Panel No results found for: CHOL, HDL, LDLCALC, TRIG    Wt Readings from Last 3 Encounters:  03/25/17 201 lb 12 oz (91.5 kg)  02/17/17 199 lb 6.4 oz (90.4 kg)  07/24/15 190 lb (86.2 kg)       ASSESSMENT AND PLAN:  New onset atrial flutter (HCC) - Plan: EKG 12-Lead Maintaining normal sinus rhythm For compliance issues, suggest we change him to xarelto 20 mg daily We'll see if this can be covered through medical  management Recommended he change from metoprolol tartrate 2 metoprolol succinate 25 mg daily   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 He will continue to work on this and does not want any assistance with chantix.  Shortness of breath - Plan: EKG 12-Lead   Total encounter time more than 25 minutes  Greater than 50% was spent in counseling and coordination of care with the patient   Disposition:   F/U  6 months   Orders Placed This Encounter  Procedures  . EKG 12-Lead     Signed, Dossie Arbour, M.D., Ph.D. 03/25/2017  [facility] [place], [place] [phone]

## 2017-03-25 ENCOUNTER — Encounter: Payer: Self-pay | Admitting: Cardiovascular Disease

## 2017-03-25 ENCOUNTER — Ambulatory Visit (INDEPENDENT_AMBULATORY_CARE_PROVIDER_SITE_OTHER): Payer: Self-pay | Admitting: Cardiovascular Disease

## 2017-03-25 VITALS — BP 126/84 | HR 73 | Ht 68.0 in | Wt 201.8 lb

## 2017-03-25 DIAGNOSIS — I825Y2 Chronic embolism and thrombosis of unspecified deep veins of left proximal lower extremity: Secondary | ICD-10-CM

## 2017-03-25 DIAGNOSIS — Z720 Tobacco use: Secondary | ICD-10-CM

## 2017-03-25 DIAGNOSIS — R0602 Shortness of breath: Secondary | ICD-10-CM

## 2017-03-25 DIAGNOSIS — I8291 Chronic embolism and thrombosis of unspecified vein: Secondary | ICD-10-CM

## 2017-03-25 DIAGNOSIS — L02419 Cutaneous abscess of limb, unspecified: Secondary | ICD-10-CM

## 2017-03-25 DIAGNOSIS — L03119 Cellulitis of unspecified part of limb: Secondary | ICD-10-CM

## 2017-03-25 DIAGNOSIS — I4892 Unspecified atrial flutter: Secondary | ICD-10-CM

## 2017-03-25 NOTE — Patient Instructions (Addendum)
Medication Instructions:   No medication changes made  Ask medical management if they can change you from  warfarin to xarelto And metoprolol tartrate to metoprolol succinate  Labwork:  No new labs needed  Testing/Procedures:  No further testing at this time   I recommend watching educational videos on topics of interest to you at:       [email or web address]  Enter code: [email or web address]    Follow-Up: It was a pleasure seeing you in the office today. Please call us if you have new issues that need to be addressed before your next appt.  [phone]  Your physician wants you to follow-up in: 6 months.  You will receive a reminder letter in the mail two months in advance. If you don't receive a letter, please call our office to schedule the follow-up appointment.  If you need a refill on your cardiac medications before your next appointment, please call your pharmacy.

## 2017-04-02 ENCOUNTER — Ambulatory Visit: Payer: Self-pay | Admitting: Pharmacy Technician

## 2017-04-05 NOTE — Progress Notes (Signed)
Patient scheduled for eligibility appointment at [facility].  Patient did not show for the appointment on Apr 05 2017 at 9:00a.m.  Patient rescheduled eligibility appointment to May 12, 2017 at 2:00p.m.Marland Kitchen    Sherilyn Dacosta Care Manager [facility]

## 2017-05-12 ENCOUNTER — Ambulatory Visit: Payer: Self-pay | Admitting: Pharmacy Technician

## 2017-05-12 NOTE — Progress Notes (Signed)
Patient scheduled for eligibility appointment at [facility].  Patient did not show for the appointment on June 13 at 2:00p.m.  Patient did not reschedule eligibility appointment.  [facility] unable to provide additional medication assistance until eligibility is determined.  Sherilyn Dacosta Care Manager [facility]

## 2017-05-14 ENCOUNTER — Encounter (INDEPENDENT_AMBULATORY_CARE_PROVIDER_SITE_OTHER): Payer: Self-pay

## 2017-05-14 ENCOUNTER — Ambulatory Visit: Payer: Self-pay | Admitting: Pharmacy Technician

## 2017-05-14 DIAGNOSIS — Z79899 Other long term (current) drug therapy: Secondary | ICD-10-CM

## 2017-05-14 NOTE — Progress Notes (Signed)
Completed [facility] application and contract.  Patient agreed to all terms of the [facility] contract.  Patient approved to receive medication assistance at [facility] through 2018, as long as eligibility criteria continues to be met.   Provided patient with Civil engineer, contracting based on his particular needs.    Arranged for patient to obtain free 30-day trial of Xarelto.  Xarelto Prescription Application completed with patient.  Forwarded to Dr. Rockey Situ for signature.  Upon receipt of signed application from providert, Xarelto Prescription Application will be submitted to [employer].  Referred patient to [facility].  Patient declined.  Stated that he wanted to continue to see provider at [facility].  [facility]

## 2017-06-15 ENCOUNTER — Telehealth: Payer: Self-pay | Admitting: Pharmacist

## 2017-06-15 NOTE — Telephone Encounter (Signed)
06/15/17 Xarelto 20mg  Take 1 daily, Faxed [employer] application for PAP enrollment, patient will receive a Pharmacy card for this medication, he will take the card each month to his regular pharmacy and pick this med up for No Cost.AJ

## 2018-02-07 ENCOUNTER — Telehealth: Payer: Self-pay | Admitting: Pharmacy Technician

## 2018-02-07 NOTE — Telephone Encounter (Signed)
Patient failed to provide 2019 financial documentation.  No additional medication assistance will be provided by [facility] without the required proof of income documentation.  Patient notified by letter.  Betty J. Kluttz Care Manager [facility] 

## 2018-03-06 ENCOUNTER — Encounter: Payer: Self-pay | Admitting: Emergency Medicine

## 2018-03-06 ENCOUNTER — Emergency Department
Admission: EM | Admit: 2018-03-06 | Discharge: 2018-03-06 | Disposition: A | Discharge status: Home / Self Care | Payer: Medicaid Other | Attending: Emergency Medicine | Admitting: Emergency Medicine

## 2018-03-06 ENCOUNTER — Emergency Department: Payer: Medicaid Other

## 2018-03-06 DIAGNOSIS — F17210 Nicotine dependence, cigarettes, uncomplicated: Secondary | ICD-10-CM | POA: Diagnosis not present

## 2018-03-06 DIAGNOSIS — Z79899 Other long term (current) drug therapy: Secondary | ICD-10-CM | POA: Diagnosis not present

## 2018-03-06 DIAGNOSIS — Z7901 Long term (current) use of anticoagulants: Secondary | ICD-10-CM | POA: Diagnosis not present

## 2018-03-06 DIAGNOSIS — R2242 Localized swelling, mass and lump, left lower limb: Secondary | ICD-10-CM | POA: Diagnosis present

## 2018-03-06 DIAGNOSIS — I82592 Chronic embolism and thrombosis of other specified deep vein of left lower extremity: Secondary | ICD-10-CM | POA: Insufficient documentation

## 2018-03-06 DIAGNOSIS — R600 Localized edema: Secondary | ICD-10-CM

## 2018-03-06 NOTE — ED Triage Notes (Signed)
Pt reports increased left leg swelling for about a week now, reports has a filter in his left leg and has been taking his xarelto. Pt reports pressure in left groin when bearing weight on left leg.

## 2018-03-06 NOTE — ED Provider Notes (Signed)
[facility] Emergency Department Provider Note  ____________________________________________   I have reviewed the triage vital signs and the nursing notes.   HISTORY  Chief Complaint Leg Swelling   History limited by: Not Limited   HPI Henry Dunn is a 42 y.o. male who presents to the emergency department today because of concerns for leg swelling.  Is located in the left leg.  Extends from his groin down through his ankle.  Been going on for about 1 week.  For the past 2 days he has been elevating his leg which he says has helped somewhat with it.  He has had associated tightness in the sense of fullness in his groin.  He does have a history of blood clots in that left leg although he states he has been taking his Xarelto.  He did miss a couple of doses a little over 1 month ago when his prescription was switched over to a new pharmacy.  He denies any chest pain although has had some shortness of breath.  Denies any fevers.    Per medical record review patient has a history of DVT, PE, vena cava filter.  Past Medical History:  Diagnosis Date  . DVT (deep venous thrombosis) (HCC)    a. In setting of dislocated hip, status post IVC filter, previously on Coumadin, noncompliant over the past 12 months  . Hx of blood clots    a. Patient denies any family history of clotting disorder, patient denies prior hypercoagulable workup  . PE (pulmonary thromboembolism) [facility])     Patient Active Problem List   Diagnosis Date Noted  . Cellulitis and abscess of leg   . New onset atrial flutter (HCC) 02/18/2017  . Chronic deep vein thrombosis (DVT) (HCC) 02/18/2017  . Tobacco abuse 02/18/2017  . DVT (deep venous thrombosis) (HCC) 02/18/2017  . Shortness of breath 02/17/2017    Past Surgical History:  Procedure Laterality Date  . INSERTION OF VENA CAVA FILTER      Prior to Admission medications   Medication Sig Start Date End Date Taking? Authorizing Provider   metoprolol tartrate (LOPRESSOR) 25 MG tablet Take 1 tablet (25 mg total) by mouth 2 (two) times daily. 02/25/17   Ramonita Lab, MD  warfarin (COUMADIN) 6 MG tablet Take 1 tablet (6 mg total) by mouth daily at 6 PM. 02/25/17   Ramonita Lab, MD    Allergies Patient has no known allergies.  Family History  Problem Relation Age of Onset  . Diabetes Neg Hx   . Hypertension Neg Hx     Social History Social History   Tobacco Use  . Smoking status: Current Every Day Smoker    Packs/day: 0.50    Types: Cigarettes  . Smokeless tobacco: Never Used  Substance Use Topics  . Alcohol use: Yes  . Drug use: Not on file    Review of Systems Constitutional: No fever/chills Eyes: No visual changes. ENT: No sore throat. Cardiovascular: Denies chest pain. Respiratory: Denies shortness of breath. Gastrointestinal: No abdominal pain.  No nausea, no vomiting.  No diarrhea.   Genitourinary: Negative for dysuria. Musculoskeletal: Positive for left leg swelling and discomfort.  Skin: Purplish discoloration to left shin. Neurological: Negative for headaches, focal weakness or numbness.  ____________________________________________   PHYSICAL EXAM:  VITAL SIGNS: ED Triage Vitals  Enc Vitals Group     BP 03/06/18 1421 131/82     Pulse Rate 03/06/18 1421 87     Resp 03/06/18 1421 16  Temp 03/06/18 1421 98 F (36.7 C)     Temp Source 03/06/18 1421 Oral     SpO2 03/06/18 1421 98 %     Weight 03/06/18 1422 205 lb (93 kg)     Height 03/06/18 1422 5\' 8"  (1.727 m)     Head Circumference --      Peak Flow --      Pain Score 03/06/18 1422 0   Constitutional: Alert and oriented. Well appearing and in no distress. Eyes: Conjunctivae are normal.  ENT   Head: Normocephalic and atraumatic.   Nose: No congestion/rhinnorhea.   Mouth/Throat: Mucous membranes are moist.   Neck: No stridor. Hematological/Lymphatic/Immunilogical: No cervical lymphadenopathy. Cardiovascular: Normal  rate, regular rhythm.  No murmurs, rubs, or gallops.  Respiratory: Normal respiratory effort without tachypnea nor retractions. Breath sounds are clear and equal bilaterally. No wheezes/rales/rhonchi. Gastrointestinal: Soft and non tender. No rebound. No guarding.  Genitourinary: Deferred Musculoskeletal: No appreciable left leg swelling when compared to the right. No pitting edema. No skin color changes appreciated. No tenderness to palpation of calf or thigh. Compartments soft.  Neurologic:  Normal speech and language. No gross focal neurologic deficits are appreciated.  Skin:  Skin is warm, dry and intact. No rash noted. Psychiatric: Mood and affect are normal. Speech and behavior are normal. Patient exhibits appropriate insight and judgment.  ____________________________________________    LABS (pertinent positives/negatives)  None  ____________________________________________   EKG  None  ____________________________________________    RADIOLOGY  [place] Left lower extremity Thrombus in left lower extremity.  ____________________________________________   PROCEDURES  Procedures  ____________________________________________   INITIAL IMPRESSION / ASSESSMENT AND PLAN / ED COURSE  Pertinent labs & imaging results that were available during my care of the patient were reviewed by me and considered in my medical decision making (see chart for details).  Patient presented to the emergency department today because of concerns for left lower leg swelling.  Patient does have a history of deep vein thrombosis on the left side.  States he has been taking his medication.  On exam there is no significant swelling or tenderness to the left leg.  No erythema.  Patient does state the leg has improved over the past couple of days.  Ultrasound does show a deep vein thrombosis however similar to previously identified thrombosis.  At this point I think likely chronic thrombosis given lack  of clinical findings.  Did discuss with patient that we could do and perform further workup to look for more proximal clot however patient did feel comfortable deferring any further workup at this time.  He does have follow-up appointment already scheduled with his doctor.  We did discuss return precautions.   ____________________________________________   FINAL CLINICAL IMPRESSION(S) / ED DIAGNOSES  Final diagnoses:  Leg edema  Chronic deep vein thrombosis (DVT) of other vein of left lower extremity (HCC)     Note: This dictation was prepared with Dragon dictation. Any transcriptional errors that result from this process are unintentional     Phineas Semen, MD 03/06/18 [phone]

## 2018-03-06 NOTE — Discharge Instructions (Signed)
Please seek medical attention for any high fevers, chest pain, shortness of breath, change in behavior, persistent vomiting, bloody stool or any other new or concerning symptoms.  

## 2018-03-14 ENCOUNTER — Emergency Department
Admission: EM | Admit: 2018-03-14 | Discharge: 2018-03-14 | Disposition: A | Discharge status: Home / Self Care | Payer: Medicaid Other | Attending: Emergency Medicine | Admitting: Emergency Medicine

## 2018-03-14 ENCOUNTER — Encounter: Payer: Self-pay | Admitting: Emergency Medicine

## 2018-03-14 ENCOUNTER — Emergency Department: Payer: Medicaid Other

## 2018-03-14 ENCOUNTER — Encounter (INDEPENDENT_AMBULATORY_CARE_PROVIDER_SITE_OTHER): Payer: Self-pay

## 2018-03-14 ENCOUNTER — Other Ambulatory Visit: Payer: Self-pay

## 2018-03-14 DIAGNOSIS — I82502 Chronic embolism and thrombosis of unspecified deep veins of left lower extremity: Secondary | ICD-10-CM | POA: Diagnosis not present

## 2018-03-14 DIAGNOSIS — O2230 Deep phlebothrombosis in pregnancy, unspecified trimester: Secondary | ICD-10-CM

## 2018-03-14 DIAGNOSIS — F17210 Nicotine dependence, cigarettes, uncomplicated: Secondary | ICD-10-CM | POA: Insufficient documentation

## 2018-03-14 DIAGNOSIS — I82491 Acute embolism and thrombosis of other specified deep vein of right lower extremity: Secondary | ICD-10-CM | POA: Diagnosis not present

## 2018-03-14 DIAGNOSIS — M79604 Pain in right leg: Secondary | ICD-10-CM | POA: Diagnosis present

## 2018-03-14 DIAGNOSIS — R0602 Shortness of breath: Secondary | ICD-10-CM

## 2018-03-14 LAB — CBC WITH DIFFERENTIAL/PLATELET
Basophils Absolute: 0.1 10*3/uL (ref 0–0.1)
Basophils Relative: 1 %
Eosinophils Absolute: 0.2 10*3/uL (ref 0–0.7)
Eosinophils Relative: 2 %
HCT: 45.3 % (ref 40.0–52.0)
Hemoglobin: 15.4 g/dL (ref 13.0–18.0)
Lymphocytes Relative: 25 %
Lymphs Abs: 2.8 10*3/uL (ref 1.0–3.6)
MCH: 31.2 pg (ref 26.0–34.0)
MCHC: 34.1 g/dL (ref 32.0–36.0)
MCV: 91.4 fL (ref 80.0–100.0)
Monocytes Absolute: 0.8 10*3/uL (ref 0.2–1.0)
Monocytes Relative: 7 %
Neutro Abs: 7.4 10*3/uL — ABNORMAL HIGH (ref 1.4–6.5)
Neutrophils Relative %: 65 %
Platelets: 191 10*3/uL (ref 150–440)
RBC: 4.95 MIL/uL (ref 4.40–5.90)
RDW: 14.3 % (ref 11.5–14.5)
WBC: 11.2 10*3/uL — ABNORMAL HIGH (ref 3.8–10.6)

## 2018-03-14 LAB — COMPREHENSIVE METABOLIC PANEL
ALT: 24 U/L (ref 17–63)
AST: 23 U/L (ref 15–41)
Albumin: 4 g/dL (ref 3.5–5.0)
Alkaline Phosphatase: 63 U/L (ref 38–126)
Anion gap: 5 (ref 5–15)
BUN: 10 mg/dL (ref 6–20)
CO2: 27 mmol/L (ref 22–32)
Calcium: 8.9 mg/dL (ref 8.9–10.3)
Chloride: 103 mmol/L (ref 101–111)
Creatinine, Ser: 0.98 mg/dL (ref 0.61–1.24)
GFR calc Af Amer: >60 mL/min (ref >60)
GFR calc non Af Amer: >60 mL/min (ref >60)
Glucose, Bld: 85 mg/dL (ref 65–99)
Potassium: 4.1 mmol/L (ref 3.5–5.1)
Sodium: 135 mmol/L (ref 135–145)
Total Bilirubin: 0.8 mg/dL (ref 0.3–1.2)
Total Protein: 7.5 g/dL (ref 6.5–8.1)

## 2018-03-14 LAB — PROTIME-INR
INR: 0.89
Prothrombin Time: 12 seconds (ref 11.4–15.2)

## 2018-03-14 LAB — TROPONIN I: Troponin I: <0.03 ng/mL (ref <0.03)

## 2018-03-14 LAB — BRAIN NATRIURETIC PEPTIDE: B Natriuretic Peptide: 88 pg/mL (ref 0.0–100.0)

## 2018-03-14 LAB — ETHANOL: Alcohol, Ethyl (B): <10 mg/dL (ref <10)

## 2018-03-14 MED ORDER — RIVAROXABAN 15 MG PO TABS
15.0000 mg | ORAL_TABLET | Freq: Once | ORAL | Status: AC
  Administered 2018-03-14: 15 mg via ORAL
  Filled 2018-03-14: qty 1

## 2018-03-14 MED ORDER — IOHEXOL 350 MG/ML SOLN
75.0000 mL | Freq: Once | INTRAVENOUS | Status: AC | PRN
  Administered 2018-03-14: 75 mL via INTRAVENOUS

## 2018-03-14 MED ORDER — FENTANYL CITRATE (PF) 100 MCG/2ML IJ SOLN
50.0000 ug | Freq: Once | INTRAMUSCULAR | Status: AC
  Administered 2018-03-14: 50 ug via INTRAVENOUS
  Filled 2018-03-14: qty 2

## 2018-03-14 MED ORDER — OXYCODONE-ACETAMINOPHEN 5-325 MG PO TABS: 1.0000 | ORAL_TABLET | ORAL | 0 refills | Status: DC | PRN

## 2018-03-14 NOTE — ED Triage Notes (Signed)
Pt here with c/o shob that began this am, states he has a history of blood clots in his left leg, has had filter placed, states pain in his groin bilaterally over the past few weeks, woke up today with shob and right leg swelling from groin to foot. Pt anxious and in pain.

## 2018-03-14 NOTE — Discharge Instructions (Addendum)
Not failed to take your Xarelto every day as directed, if you have chest pain or shortness of breath return to the emergency room.  Follow closely with cardiology and vascular surgery for this blood clot.  If increased pain, you feel your foot go cold or you have other concerns, please return immediately to the emergency department.  Your left heart may be slightly enlarged on the x-ray, please follow closely with cardiology as well.  Do not fail to follow up with vascular surgery in 2 days to have this clot removed

## 2018-03-14 NOTE — ED Notes (Signed)
Recollect of blue and green tubes sent to lab

## 2018-03-14 NOTE — ED Notes (Signed)
Mom left pt to go to work, left her number with this RN [phone].

## 2018-03-14 NOTE — ED Provider Notes (Addendum)
[facility] Emergency Department Provider Note  ____________________________________________   I have reviewed the triage vital signs and the nursing notes. Where available I have reviewed prior notes and, if possible and indicated, outside hospital notes.    HISTORY  Chief Complaint Shortness of Breath and Leg Swelling    HPI Henry Dunn is a 42 y.o. male history of left lower extremity DVT, status post IVC filter remotely, on Xarelto, who states that he has been having swelling to the left lower extremity for "a long time" but now is concerned about a feeling of pressure in the right lower extremity.  He denies any focal weakness.  States he just feels pressure in his leg and he is worried he has a DVT there.  He also has some mild exertional dyspnea which apparently has been present for a long period of time.  Patient was admitted for similar 2018, which time he had a negative CT of the chest despite similar complaints.  He was seen here about a week ago, had left-sided DVT Doppler done which did not show any new clot.  Unfortunately, patient is elected not to take his Xarelto despite his concerns about his DVTs, and has not been on it now for the last 3 or 4 days. He states he just did not get it picked up from the pharmacy.  Patient denies any chest pain, he has a mild exertional dyspnea which is been documented as being present for over a year and he states that it is chronic.   Past Medical History:  Diagnosis Date  . DVT (deep venous thrombosis) (HCC)    a. In setting of dislocated hip, status post IVC filter, previously on Coumadin, noncompliant over the past 12 months  . Hx of blood clots    a. Patient denies any family history of clotting disorder, patient denies prior hypercoagulable workup  . PE (pulmonary thromboembolism) [facility])     Patient Active Problem List   Diagnosis Date Noted  . Cellulitis and abscess of leg   . New onset atrial flutter (HCC)  02/18/2017  . Chronic deep vein thrombosis (DVT) (HCC) 02/18/2017  . Tobacco abuse 02/18/2017  . DVT (deep venous thrombosis) (HCC) 02/18/2017  . Shortness of breath 02/17/2017    Past Surgical History:  Procedure Laterality Date  . INSERTION OF VENA CAVA FILTER        Allergies Patient has no known allergies.  Family History  Problem Relation Age of Onset  . Diabetes Neg Hx   . Hypertension Neg Hx     Social History Social History   Tobacco Use  . Smoking status: Current Every Day Smoker    Packs/day: 0.50    Types: Cigarettes  . Smokeless tobacco: Never Used  Substance Use Topics  . Alcohol use: Yes  . Drug use: Not on file    Review of Systems Constitutional: No fever/chills Eyes: No visual changes. ENT: No sore throat. No stiff neck no neck pain Cardiovascular: Denies chest pain. Respiratory:   + shortness of breath. Gastrointestinal:   no vomiting.  No diarrhea.  No constipation. Genitourinary: Negative for dysuria. Musculoskeletal: Chronic sensation of lower extremity swelling Skin: Negative for rash. Neurological: Negative for severe headaches, focal weakness or numbness.   ____________________________________________   PHYSICAL EXAM:  VITAL SIGNS: ED Triage Vitals  Enc Vitals Group     BP 03/14/18 0817 (!) 121/14     Pulse Rate 03/14/18 0817 (!) 126     Resp 03/14/18  0817 20     Temp 03/14/18 0817 (!) 97.5 F (36.4 C)     Temp Source 03/14/18 0817 Oral     SpO2 03/14/18 0817 99 %     Weight 03/14/18 0818 200 lb (90.7 kg)     Height 03/14/18 0818 5\' 8"  (1.727 m)     Head Circumference --      Peak Flow --      Pain Score 03/14/18 0817 8     Pain Loc --      Pain Edu? --      Excl. in GC? --     Constitutional: Alert and oriented. Well appearing and in no acute distress.  Patient is anxious but in no acute distress Eyes: Conjunctivae are normal Head: Atraumatic HEENT: No congestion/rhinnorhea. Mucous membranes are moist.  Oropharynx  non-erythematous Neck:   Nontender with no meningismus, no masses, no stridor Cardiovascular: Normal rate, regular rhythm. Grossly normal heart sounds.  Good peripheral circulation. Respiratory: Normal respiratory effort.  No retractions. Lungs CTAB. Abdominal: Soft and nontender. No distention. No guarding no rebound Back:  There is no focal tenderness or step off.  there is no midline tenderness there are no lesions noted. there is no CVA tenderness Musculoskeletal: No lower extremity tenderness, no upper extremity tenderness. No joint effusions, there is no swelling of any significance lower extremities he has got very strong posterior tibial pulses and good perfusion bilaterally.   Neurologic:  Normal speech and language. No gross focal neurologic deficits are appreciated.  Skin:  Skin is warm, dry and intact. No rash noted. Psychiatric: Mood and affect are quite anxious. Speech and behavior are normal.  ____________________________________________   LABS (all labs ordered are listed, but only abnormal results are displayed)  Labs Reviewed  PROTIME-INR  TROPONIN I  BRAIN NATRIURETIC PEPTIDE  COMPREHENSIVE METABOLIC PANEL  ETHANOL  URINALYSIS, COMPLETE (UACMP) WITH MICROSCOPIC  URINE DRUG SCREEN, QUALITATIVE ([facility] ONLY)    Pertinent labs  results that were available during my care of the patient were reviewed by me and considered in my medical decision making (see chart for details). ____________________________________________  EKG  I personally interpreted any EKGs ordered by me or triage  ____________________________________________  RADIOLOGY  Pertinent labs & imaging results that were available during my care of the patient were reviewed by me and considered in my medical decision making (see chart for details). If possible, patient and/or family made aware of any abnormal findings.  No results found. ____________________________________________     PROCEDURES  Procedure(s) performed: None  Procedures  Critical Care performed: None  ____________________________________________   INITIAL IMPRESSION / ASSESSMENT AND PLAN / ED COURSE  Pertinent labs & imaging results that were available during my care of the patient were reviewed by me and considered in my medical decision making (see chart for details).  Patient here with a history of DVTs, has not been compliant with his medications where he has a DVT now in his right lower extremity in addition to the chronic wound present in his left.  He also states that he has shortness of breath which is been present for a long period of time, he denies any fever chills or chest pain, exam does not show any evidence of significant vascular insufficiency or other acute findings however we will obtain Dopplers bilaterally to ensure no change in his disease on the left, and no advent of disease on the right, and we will obtain CT scan of his chest given noncompliance to  see if he has a new PE, will check basic blood work, EKG, cardiac enzymes and BNP to look for other possible causes of his perceived symptoms and we will monitor him closely in the emergency department.  While we are waiting for all this to be done I will order a Xarelto for him as he is supposed to be taking it and I certainly do not wish any progression disease to happen while we are working him up.  ----------------------------------------- 12:59 PM on 03/14/2018 -----------------------------------------  Patient has received Xarelto I did discuss with Dr. Gilda Crease who does not feel acute emergency intervention is otherwise needed or admission, patient has no shortness of breath, he has been low level shortness of breath he states for over a year, cardiac enzymes are reassuring he has no evidence of PE or CHF.  We will discharge him therefore with close outpatient follow-up I have strongly advised him to be consult with his  medication and he will do so he states.  Dr. Gilda Crease will see him in the office the day after tomorrow and extract the clot.  He does not feel the patient needs acute admission .  He is vascularly intact with good pulses and his pain is well controlled.  Does have clot burden and understands the absolute need for close follow-up.  I have again and repeatedly stressed the need to take his medication.    ____________________________________________   FINAL CLINICAL IMPRESSION(S) / ED DIAGNOSES  Final diagnoses:  None      This chart was dictated using voice recognition software.  Despite best efforts to proofread,  errors can occur which can change meaning.      Jeanmarie Plant, MD 03/14/18 0865    Jeanmarie Plant, MD 03/14/18 7846    Jeanmarie Plant, MD 03/14/18 [phone]

## 2018-03-14 NOTE — ED Notes (Signed)
First Nurse Note:  Patient to ED via WC from POV, complaining of swelling in lower extremities and SHOB.  States he has a "screen" in his groin.  Pulse Ox - 94% on room air.  To Triage.

## 2018-03-14 NOTE — ED Notes (Signed)
Pt to ultrasound

## 2018-03-14 NOTE — ED Notes (Signed)
Pt discharged home after verbalizing understanding of discharge instructions; nad noted. 

## 2018-03-14 NOTE — ED Notes (Addendum)
Pt presents from home with groin and right leg pain. States he has hx of dvt, has filter. Pt states he usually has pain in his left leg, but today he awakened with severe pain this morning. States his leg is numb, making it difficult to walk. Pt alert & oriented with NAD noted. Has not taken xarelto since Thursday night. He has had some pain for 2-3 weeks.

## 2018-03-15 ENCOUNTER — Other Ambulatory Visit (INDEPENDENT_AMBULATORY_CARE_PROVIDER_SITE_OTHER): Payer: Self-pay | Admitting: Vascular Surgery

## 2018-03-17 ENCOUNTER — Encounter
Admission: RE | Admit: 2018-03-17 | Discharge: 2018-03-17 | Disposition: A | Discharge status: Home / Self Care | Payer: Medicaid Other | Source: Ambulatory Visit | Attending: Vascular Surgery | Admitting: Vascular Surgery

## 2018-03-17 DIAGNOSIS — Z01812 Encounter for preprocedural laboratory examination: Secondary | ICD-10-CM | POA: Insufficient documentation

## 2018-03-17 HISTORY — DX: Unspecified atrial fibrillation: I48.91

## 2018-03-17 LAB — CREATININE, SERUM
Creatinine, Ser: 0.87 mg/dL (ref 0.61–1.24)
GFR calc Af Amer: >60 mL/min (ref >60)
GFR calc non Af Amer: >60 mL/min (ref >60)

## 2018-03-17 LAB — BUN: BUN: 11 mg/dL (ref 6–20)

## 2018-03-17 MED ORDER — CEFAZOLIN SODIUM-DEXTROSE 2-4 GM/100ML-% IV SOLN
2.0000 g | Freq: Once | INTRAVENOUS | Status: AC
  Administered 2018-03-18: 2 g via INTRAVENOUS

## 2018-03-18 ENCOUNTER — Inpatient Hospital Stay
Admission: RE | Admit: 2018-03-18 | Discharge: 2018-03-23 | DRG: 271 | Disposition: A | Discharge status: Home / Self Care | Payer: Medicaid Other | Source: Ambulatory Visit | Attending: Vascular Surgery | Admitting: Vascular Surgery

## 2018-03-18 ENCOUNTER — Other Ambulatory Visit: Payer: Self-pay

## 2018-03-18 ENCOUNTER — Encounter
Admission: RE | Disposition: A | Discharge status: Home / Self Care | Payer: Self-pay | Source: Ambulatory Visit | Attending: Vascular Surgery

## 2018-03-18 DIAGNOSIS — D6859 Other primary thrombophilia: Secondary | ICD-10-CM | POA: Diagnosis present

## 2018-03-18 DIAGNOSIS — Z95828 Presence of other vascular implants and grafts: Secondary | ICD-10-CM

## 2018-03-18 DIAGNOSIS — I82409 Acute embolism and thrombosis of unspecified deep veins of unspecified lower extremity: Secondary | ICD-10-CM

## 2018-03-18 DIAGNOSIS — Z86711 Personal history of pulmonary embolism: Secondary | ICD-10-CM

## 2018-03-18 DIAGNOSIS — Z7901 Long term (current) use of anticoagulants: Secondary | ICD-10-CM

## 2018-03-18 DIAGNOSIS — Z86718 Personal history of other venous thrombosis and embolism: Secondary | ICD-10-CM

## 2018-03-18 DIAGNOSIS — I82413 Acute embolism and thrombosis of femoral vein, bilateral: Secondary | ICD-10-CM | POA: Diagnosis present

## 2018-03-18 DIAGNOSIS — I82423 Acute embolism and thrombosis of iliac vein, bilateral: Principal | ICD-10-CM | POA: Diagnosis present

## 2018-03-18 DIAGNOSIS — M79604 Pain in right leg: Secondary | ICD-10-CM

## 2018-03-18 DIAGNOSIS — I82401 Acute embolism and thrombosis of unspecified deep veins of right lower extremity: Secondary | ICD-10-CM

## 2018-03-18 DIAGNOSIS — I4891 Unspecified atrial fibrillation: Secondary | ICD-10-CM | POA: Diagnosis present

## 2018-03-18 DIAGNOSIS — Z79899 Other long term (current) drug therapy: Secondary | ICD-10-CM

## 2018-03-18 DIAGNOSIS — I82402 Acute embolism and thrombosis of unspecified deep veins of left lower extremity: Secondary | ICD-10-CM

## 2018-03-18 DIAGNOSIS — F17210 Nicotine dependence, cigarettes, uncomplicated: Secondary | ICD-10-CM | POA: Diagnosis present

## 2018-03-18 HISTORY — PX: PERIPHERAL VASCULAR THROMBECTOMY: CATH118306

## 2018-03-18 LAB — PROTIME-INR
INR: 1.13
Prothrombin Time: 14.4 seconds (ref 11.4–15.2)

## 2018-03-18 LAB — APTT
aPTT: 132 seconds — ABNORMAL HIGH (ref 24–36)
aPTT: 39 seconds — ABNORMAL HIGH (ref 24–36)

## 2018-03-18 LAB — HEPARIN LEVEL (UNFRACTIONATED)
Heparin Unfractionated: 0.7 IU/mL (ref 0.30–0.70)
Heparin Unfractionated: <0.1 IU/mL — ABNORMAL LOW (ref 0.30–0.70)

## 2018-03-18 SURGERY — PERIPHERAL VASCULAR THROMBECTOMY
Anesthesia: Moderate Sedation | Laterality: Right

## 2018-03-18 MED ORDER — SODIUM CHLORIDE FLUSH 0.9 % IV SOLN
INTRAVENOUS | Status: AC
  Filled 2018-03-18: qty 10

## 2018-03-18 MED ORDER — FENTANYL CITRATE (PF) 100 MCG/2ML IJ SOLN
INTRAMUSCULAR | Status: AC
  Filled 2018-03-18: qty 2

## 2018-03-18 MED ORDER — SODIUM CHLORIDE 0.9 % IV SOLN
INTRAVENOUS | Status: DC
  Administered 2018-03-18: 1000 mL via INTRAVENOUS

## 2018-03-18 MED ORDER — SORBITOL 70 % SOLN
30.0000 mL | Freq: Every day | Status: DC | PRN
  Filled 2018-03-18: qty 30

## 2018-03-18 MED ORDER — HEPARIN (PORCINE) IN NACL 100-0.45 UNIT/ML-% IJ SOLN
1850.0000 [IU]/h | INTRAMUSCULAR | Status: DC
  Administered 2018-03-18: 1400 [IU]/h via INTRAVENOUS
  Administered 2018-03-19: 1700 [IU]/h via INTRAVENOUS
  Filled 2018-03-18: qty 250

## 2018-03-18 MED ORDER — MIDAZOLAM HCL 5 MG/5ML IJ SOLN
INTRAMUSCULAR | Status: AC
  Filled 2018-03-18: qty 10

## 2018-03-18 MED ORDER — HEPARIN BOLUS VIA INFUSION
3000.0000 [IU] | Freq: Once | INTRAVENOUS | Status: AC
  Administered 2018-03-18: 3000 [IU] via INTRAVENOUS
  Filled 2018-03-18: qty 3000

## 2018-03-18 MED ORDER — MORPHINE SULFATE (PF) 4 MG/ML IV SOLN
2.0000 mg | INTRAVENOUS | Status: DC | PRN
  Administered 2018-03-19: 4 mg via INTRAVENOUS
  Filled 2018-03-18: qty 1

## 2018-03-18 MED ORDER — HYDROMORPHONE HCL 1 MG/ML IJ SOLN: 1.0000 mg | Freq: Once | INTRAMUSCULAR | Status: DC | PRN

## 2018-03-18 MED ORDER — HEPARIN (PORCINE) IN NACL 1000-0.9 UT/500ML-% IV SOLN
INTRAVENOUS | Status: AC
  Filled 2018-03-18: qty 1000

## 2018-03-18 MED ORDER — HEPARIN SODIUM (PORCINE) 1000 UNIT/ML IJ SOLN
INTRAMUSCULAR | Status: DC | PRN
  Administered 2018-03-18: 4000 [IU] via INTRAVENOUS
  Administered 2018-03-18: 2000 [IU] via INTRAVENOUS

## 2018-03-18 MED ORDER — FAMOTIDINE 20 MG PO TABS: 40.0000 mg | ORAL_TABLET | ORAL | Status: DC | PRN

## 2018-03-18 MED ORDER — FLEET ENEMA 7-19 GM/118ML RE ENEM: 1.0000 | ENEMA | Freq: Once | RECTAL | Status: DC | PRN

## 2018-03-18 MED ORDER — ONDANSETRON HCL 4 MG PO TABS: 4.0000 mg | ORAL_TABLET | Freq: Four times a day (QID) | ORAL | Status: DC | PRN

## 2018-03-18 MED ORDER — METHYLPREDNISOLONE SODIUM SUCC 125 MG IJ SOLR: 125.0000 mg | INTRAMUSCULAR | Status: DC | PRN

## 2018-03-18 MED ORDER — ONDANSETRON HCL 4 MG/2ML IJ SOLN: 4.0000 mg | Freq: Four times a day (QID) | INTRAMUSCULAR | Status: DC | PRN

## 2018-03-18 MED ORDER — MIDAZOLAM HCL 5 MG/5ML IJ SOLN
INTRAMUSCULAR | Status: AC
  Filled 2018-03-18: qty 5

## 2018-03-18 MED ORDER — ASPIRIN EC 81 MG PO TBEC
81.0000 mg | DELAYED_RELEASE_TABLET | Freq: Every day | ORAL | Status: DC
  Administered 2018-03-18 – 2018-03-23 (×6): 81 mg via ORAL
  Filled 2018-03-18 (×6): qty 1

## 2018-03-18 MED ORDER — LIDOCAINE HCL (PF) 1 % IJ SOLN
INTRAMUSCULAR | Status: AC
  Filled 2018-03-18: qty 30

## 2018-03-18 MED ORDER — MAGNESIUM HYDROXIDE 400 MG/5ML PO SUSP
30.0000 mL | Freq: Every day | ORAL | Status: DC | PRN
  Administered 2018-03-21: 30 mL via ORAL
  Filled 2018-03-18 (×2): qty 30

## 2018-03-18 MED ORDER — OXYCODONE HCL 5 MG PO TABS
5.0000 mg | ORAL_TABLET | ORAL | Status: DC | PRN
  Administered 2018-03-18: 10 mg via ORAL
  Administered 2018-03-18: 5 mg via ORAL
  Administered 2018-03-18 – 2018-03-23 (×21): 10 mg via ORAL
  Filled 2018-03-18 (×22): qty 2
  Filled 2018-03-18: qty 1

## 2018-03-18 MED ORDER — ALTEPLASE 2 MG IJ SOLR
INTRAMUSCULAR | Status: DC | PRN
  Administered 2018-03-18 (×2): 8 mg

## 2018-03-18 MED ORDER — HEPARIN (PORCINE) IN NACL 100-0.45 UNIT/ML-% IJ SOLN
INTRAMUSCULAR | Status: AC
  Administered 2018-03-18: 1400 [IU]/h via INTRAVENOUS
  Filled 2018-03-18: qty 250

## 2018-03-18 MED ORDER — HEPARIN BOLUS VIA INFUSION
2600.0000 [IU] | Freq: Once | INTRAVENOUS | Status: AC
  Administered 2018-03-18: 2600 [IU] via INTRAVENOUS
  Filled 2018-03-18: qty 2600

## 2018-03-18 MED ORDER — HEPARIN SODIUM (PORCINE) 1000 UNIT/ML IJ SOLN
INTRAMUSCULAR | Status: AC
  Filled 2018-03-18: qty 1

## 2018-03-18 MED ORDER — MIDAZOLAM HCL 2 MG/2ML IJ SOLN
INTRAMUSCULAR | Status: DC | PRN
  Administered 2018-03-18 (×10): 1 mg via INTRAVENOUS
  Administered 2018-03-18: 2 mg via INTRAVENOUS

## 2018-03-18 MED ORDER — ALTEPLASE 2 MG IJ SOLR
INTRAMUSCULAR | Status: AC
  Filled 2018-03-18: qty 16

## 2018-03-18 MED ORDER — FENTANYL CITRATE (PF) 100 MCG/2ML IJ SOLN
INTRAMUSCULAR | Status: AC
  Filled 2018-03-18: qty 4

## 2018-03-18 MED ORDER — SODIUM CHLORIDE 0.9 % IV SOLN
INTRAVENOUS | Status: AC | PRN
  Administered 2018-03-18: 250 mL via INTRAVENOUS

## 2018-03-18 MED ORDER — ACETAMINOPHEN 325 MG PO TABS: 650.0000 mg | ORAL_TABLET | Freq: Four times a day (QID) | ORAL | Status: DC | PRN

## 2018-03-18 MED ORDER — CEFAZOLIN SODIUM-DEXTROSE 1-4 GM/50ML-% IV SOLN
INTRAVENOUS | Status: AC
  Filled 2018-03-18: qty 50

## 2018-03-18 MED ORDER — CEFAZOLIN SODIUM-DEXTROSE 2-4 GM/100ML-% IV SOLN: 2.0000 g | INTRAVENOUS | Status: DC

## 2018-03-18 MED ORDER — DOCUSATE SODIUM 100 MG PO CAPS
100.0000 mg | ORAL_CAPSULE | Freq: Two times a day (BID) | ORAL | Status: DC
  Administered 2018-03-19 – 2018-03-23 (×9): 100 mg via ORAL
  Filled 2018-03-18 (×9): qty 1

## 2018-03-18 MED ORDER — DEXTROSE-NACL 5-0.9 % IV SOLN
INTRAVENOUS | Status: DC
  Administered 2018-03-18 – 2018-03-19 (×3): via INTRAVENOUS

## 2018-03-18 MED ORDER — ACETAMINOPHEN 650 MG RE SUPP: 650.0000 mg | Freq: Four times a day (QID) | RECTAL | Status: DC | PRN

## 2018-03-18 MED ORDER — FENTANYL CITRATE (PF) 100 MCG/2ML IJ SOLN
INTRAMUSCULAR | Status: DC | PRN
  Administered 2018-03-18: 50 ug via INTRAVENOUS
  Administered 2018-03-18 (×4): 25 ug via INTRAVENOUS
  Administered 2018-03-18: 50 ug via INTRAVENOUS
  Administered 2018-03-18 (×3): 25 ug via INTRAVENOUS
  Administered 2018-03-18: 12.5 ug via INTRAVENOUS
  Administered 2018-03-18: 25 ug via INTRAVENOUS
  Administered 2018-03-18: 12.5 ug via INTRAVENOUS

## 2018-03-18 MED ORDER — IOPAMIDOL (ISOVUE-300) INJECTION 61%
INTRAVENOUS | Status: DC | PRN
  Administered 2018-03-18: 100 mL via INTRAVENOUS

## 2018-03-18 SURGICAL SUPPLY — 25 items
BALLN ATG 12X6X80 (BALLOONS) ×4
BALLN DORADO 10X80X80 (BALLOONS) ×2
BALLN DORADO 8X100X80 (BALLOONS) ×4
BALLOON ATG 12X6X80 (BALLOONS) ×2 IMPLANT
BALLOON DORADO 10X80X80 (BALLOONS) ×1 IMPLANT
BALLOON DORADO 8X100X80 (BALLOONS) ×2 IMPLANT
CANISTER PENUMBRA ENGINE (MISCELLANEOUS) ×2 IMPLANT
CATH INDIGO D 50CM (CATHETERS) ×2 IMPLANT
CATH INDIGO SEP D (CATHETERS) ×2 IMPLANT
CATH INFUS 90CMX20CM (CATHETERS) ×4 IMPLANT
CATH PIG 70CM (CATHETERS) ×2 IMPLANT
DEVICE PRESTO INFLATION (MISCELLANEOUS) ×4 IMPLANT
DEVICE TORQUE .025-.038 (MISCELLANEOUS) ×2 IMPLANT
GLIDECATH NONTAPER ANGL 5FR (CATHETERS) ×2 IMPLANT
GLIDEWIRE STIFF .35X180X3 HYDR (WIRE) ×2 IMPLANT
NEEDLE ENTRY 21GA 7CM ECHOTIP (NEEDLE) ×2 IMPLANT
PACK ANGIOGRAPHY (CUSTOM PROCEDURE TRAY) ×2 IMPLANT
SET INTRO CAPELLA COAXIAL (SET/KITS/TRAYS/PACK) ×2 IMPLANT
SHEATH 9FRX11 (SHEATH) ×4 IMPLANT
SHEATH BRITE TIP 8FRX11 (SHEATH) ×4 IMPLANT
STENT VENOVO 14X100X80 (Permanent Stent) ×2 IMPLANT
STENT VENOVO 14X80X80 (Permanent Stent) ×2 IMPLANT
TOWEL OR 17X26 4PK STRL BLUE (TOWEL DISPOSABLE) ×2 IMPLANT
WIRE MAGIC TOR.035 180C (WIRE) ×4 IMPLANT
WIRE SPARTACORE .014X190CM (WIRE) ×2 IMPLANT

## 2018-03-18 NOTE — Progress Notes (Signed)
ANTICOAGULATION CONSULT NOTE - Initial Consult  Pharmacy Consult for heparin Indication: DVT  No Known Allergies  Patient Measurements: Height: 5\' 8"  (172.7 cm) Weight: 199 lb 15.3 oz (90.7 kg) IBW/kg (Calculated) : 68.4 Heparin Dosing Weight: 87.1 kg  Vital Signs: Temp: 98.6 F (37 C) (04/19 1419) Temp Source: Oral (04/19 1419) BP: 118/91 (04/19 1419) Pulse Rate: 93 (04/19 1419)  Labs: Recent Labs    03/17/18 0826 03/18/18 1307 03/18/18 1901  APTT  --  132* 39*  LABPROT  --  14.4  --   INR  --  1.13  --   HEPARINUNFRC  --  0.70 <0.10*  CREATININE 0.87  --   --     Estimated Creatinine Clearance: 120.9 mL/min (by C-G formula based on SCr of 0.87 mg/dL).   Medical History: Past Medical History:  Diagnosis Date  . A-fib (HCC)   . DVT (deep venous thrombosis) (HCC)    a. In setting of dislocated hip, status post IVC filter, previously on Coumadin, noncompliant over the past 12 months  . Hx of blood clots    a. Patient denies any family history of clotting disorder, patient denies prior hypercoagulable workup  . PE (pulmonary thromboembolism) (HCC)     Medications:  Infusions:  . dextrose 5 % and 0.9% NaCl 75 mL/hr at 03/18/18 1611  . heparin 1,400 Units/hr (03/18/18 1234)    Assessment: 42 yom with CC painful blood clot in leg. PMH AF, DVT (in setting of dislocated hip), hx blood clots, PE, patient had previously been on VKA but noncompliant, PTA med list includes Xarelto with last dose documented as past week unknown time. Pharmacy consulted to dose heparin for DVT.  Goal of Therapy:  Heparin level 0.3-0.7 units/ml Monitor platelets by anticoagulation protocol: Yes   Plan:  Give 3000 units bolus x 1 (half bolus requested by vascular due to s/p thrombolysis/thrombectomy) Start heparin infusion at 1400 units/hr Check anti-Xa level in 6 hours and daily while on heparin Continue to monitor H&H and platelets  03/18/18 19:01 HL and aPTT subtherapeutic (levels  correlate so dose off HL). 2600 units IV x 1 bolus and increase rate to 1700 units/hr. Will recheck HL in 6 hours.  Carola Frost, Pharm.D., BCPS Clinical Pharmacist 03/18/2018,7:42 PM

## 2018-03-18 NOTE — Progress Notes (Addendum)
ANTICOAGULATION CONSULT NOTE - Initial Consult  Pharmacy Consult for heparin Indication: DVT  No Known Allergies  Patient Measurements: Height: 5\' 8"  (172.7 cm) Weight: 200 lb (90.7 kg) IBW/kg (Calculated) : 68.4 Heparin Dosing Weight: 87.1 kg  Vital Signs: Temp: 98.7 F (37.1 C) (04/19 0708) BP: 130/97 (04/19 1215) Pulse Rate: 98 (04/19 1215)  Labs: Recent Labs    03/17/18 0826  CREATININE 0.87    Estimated Creatinine Clearance: 120.9 mL/min (by C-G formula based on SCr of 0.87 mg/dL).   Medical History: Past Medical History:  Diagnosis Date  . A-fib (HCC)   . DVT (deep venous thrombosis) (HCC)    a. In setting of dislocated hip, status post IVC filter, previously on Coumadin, noncompliant over the past 12 months  . Hx of blood clots    a. Patient denies any family history of clotting disorder, patient denies prior hypercoagulable workup  . PE (pulmonary thromboembolism) (HCC)     Medications:  Infusions:  . ceFAZolin    . dextrose 5 % and 0.9% NaCl    . heparin    . heparin      Assessment: 42 yom with CC painful blood clot in leg. PMH AF, DVT (in setting of dislocated hip), hx blood clots, PE, patient had previously been on VKA but noncompliant, PTA med list includes Xarelto with last dose documented as past week unknown time. Pharmacy consulted to dose heparin for DVT.  Goal of Therapy:  Heparin level 0.3-0.7 units/ml Monitor platelets by anticoagulation protocol: Yes   Plan:  Give 3000 units bolus x 1 (half bolus requested by vascular due to s/p thrombolysis/thrombectomy) Start heparin infusion at 1400 units/hr Check anti-Xa level in 6 hours and daily while on heparin Continue to monitor H&H and platelets  Carola Frost, Pharm.D., BCPS Clinical Pharmacist 03/18/2018,12:29 PM

## 2018-03-18 NOTE — H&P (Signed)
@LOGO @   MRN : [id number]  Henry Dunn is a 42 y.o. (1976/01/05) male who presents with chief complaint of painful blood clot in my leg.  History of Present Illness:   The patient presents to the office for evaluation of DVT.  DVT was identified at [facility] by Duplex ultrasound.  The initial symptoms were pain and swelling in the right lower extremity.  He was seen 3 days ago in the emergency room where this was identified.  At that time it was decided to let him go home and he was initiated on Xarelto.  The patient notes the leg continues to be very painful with dependency and swells quite a bite.  He is also concerned that his left leg is now much worse and is swollen and painful particularly in the groin area.  Symptoms are much better with elevation.  The patient notes minimal edema in the morning which steadily worsens throughout the day.  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  The patient has not been using compression therapy at this point.  No SOB or pleuritic chest pains.  No cough or hemoptysis.  No blood per rectum or blood in any sputum.  No excessive bruising per the patient.   Current Meds  Medication Sig  . metoprolol succinate (TOPROL-XL) 25 MG 24 hr tablet Take 25 mg by mouth daily.  Marland Kitchen oxyCODONE-acetaminophen (PERCOCET) 5-325 MG tablet Take 1 tablet by mouth every 4 (four) hours as needed for severe pain.  Marland Kitchen XARELTO 20 MG TABS tablet Take 20 mg by mouth daily.    Past Medical History:  Diagnosis Date  . A-fib (HCC)   . DVT (deep venous thrombosis) (HCC)    a. In setting of dislocated hip, status post IVC filter, previously on Coumadin, noncompliant over the past 12 months  . Hx of blood clots    a. Patient denies any family history of clotting disorder, patient denies prior hypercoagulable workup  . PE (pulmonary thromboembolism) (HCC)     Past Surgical History:  Procedure Laterality Date  .  INSERTION OF VENA CAVA FILTER      Social History Social History   Tobacco Use  . Smoking status: Current Every Day Smoker    Packs/day: 0.50    Types: Cigarettes  . Smokeless tobacco: Never Used  Substance Use Topics  . Alcohol use: Yes    Alcohol/week: 7.2 oz    Types: 12 Cans of beer per week  . Drug use: Not Currently    Family History Family History  Problem Relation Age of Onset  . Diabetes Neg Hx   . Hypertension Neg Hx     No Known Allergies   REVIEW OF SYSTEMS (Negative unless checked)  Constitutional: [] Weight loss  [] Fever  [] Chills Cardiac: [] Chest pain   [] Chest pressure   [] Palpitations   [] Shortness of breath when laying flat   [] Shortness of breath with exertion. Vascular:  [x] Pain in legs with walking   [x] Pain in legs at rest  [x] History of DVT   [] Phlebitis   [x] Swelling in legs   [] Varicose veins   [] Non-healing ulcers Pulmonary:   [] Uses home oxygen   [] Productive cough   [] Hemoptysis   [] Wheeze  [] COPD   [] Asthma Neurologic:  [] Dizziness   [] Seizures   [] History of stroke   [] History of TIA  [] Aphasia   [] Vissual changes   [] Weakness or numbness in arm   [] Weakness  or numbness in leg Musculoskeletal:   [] Joint swelling   [] Joint pain   [] Low back pain Hematologic:  [] Easy bruising  [] Easy bleeding   [] Hypercoagulable state   [] Anemic Gastrointestinal:  [] Diarrhea   [] Vomiting  [] Gastroesophageal reflux/heartburn   [] Difficulty swallowing. Genitourinary:  [] Chronic kidney disease   [] Difficult urination  [] Frequent urination   [] Blood in urine Skin:  [] Rashes   [] Ulcers  Psychological:  [] History of anxiety   []  History of major depression.  Physical Examination  Vitals:   03/18/18 0708  BP: 118/88  Pulse: (!) 124  Resp: 17  Temp: 98.7 F (37.1 C)  SpO2: 98%  Weight: 200 lb (90.7 kg)  Height: 5\' 8"  (1.727 m)   Body mass index is 30.41 kg/m. Gen: WD/WN, NAD Head: [place]/AT, No temporalis wasting.  Ear/Nose/Throat: Hearing grossly intact,  nares w/o erythema or drainage Eyes: PER, EOMI, sclera nonicteric.  Neck: Supple, no large masses.   Pulmonary:  Good air movement, no audible wheezing bilaterally, no use of accessory muscles.  Cardiac: RRR, no JVD Vascular: Both legs are swollen and tight.  Toes are pink and warm.  Right leg appears to be more affected than the left. Vessel Right Left  Radial Palpable Palpable  PT Palpable Palpable  DP Palpable Palpable  Gastrointestinal: Non-distended. No guarding/no peritoneal signs.  Musculoskeletal: M/S 5/5 throughout.  No deformity or atrophy.  Neurologic: CN 2-12 intact. Symmetrical.  Speech is fluent. Motor exam as listed above. Psychiatric: Judgment intact, Mood & affect appropriate for pt's clinical situation. Dermatologic: No rashes or ulcers noted.  No changes consistent with cellulitis. Lymph : No lichenification or skin changes of chronic lymphedema.  CBC Lab Results  Component Value Date   WBC 11.2 (H) 03/14/2018   HGB 15.4 03/14/2018   HCT 45.3 03/14/2018   MCV 91.4 03/14/2018   PLT 191 03/14/2018    BMET    Component Value Date/Time   NA 135 03/14/2018 1001   NA 136 09/27/2013 0816   K 4.1 03/14/2018 1001   K 3.9 09/27/2013 0816   CL 103 03/14/2018 1001   CL 105 09/27/2013 0816   CO2 27 03/14/2018 1001   CO2 26 09/27/2013 0816   GLUCOSE 85 03/14/2018 1001   GLUCOSE 98 09/27/2013 0816   BUN 11 03/17/2018 0826   BUN 15 09/27/2013 0816   CREATININE 0.87 03/17/2018 0826   CREATININE 0.93 09/27/2013 0816   CALCIUM 8.9 03/14/2018 1001   CALCIUM 9.2 09/27/2013 0816   GFRNONAA >60 03/17/2018 0826   GFRNONAA >60 09/27/2013 0816   GFRAA >60 03/17/2018 0826   GFRAA >60 09/27/2013 0816   Estimated Creatinine Clearance: 120.9 mL/min (by C-G formula based on SCr of 0.87 mg/dL).  COAG Lab Results  Component Value Date   INR 0.89 03/14/2018   INR 2.17 02/26/2017   INR 2.12 02/25/2017    Radiology Ct Angio Chest Pe W And/or Wo Contrast  Result Date:  03/14/2018 CLINICAL DATA:  Acute onset shortness of breath with bilateral groin pain and right leg swelling. History of deep venous thrombosis. EXAM: CT ANGIOGRAPHY CHEST WITH CONTRAST TECHNIQUE: Multidetector CT imaging of the chest was performed using the standard protocol during bolus administration of intravenous contrast. Multiplanar CT image reconstructions and MIPs were obtained to evaluate the vascular anatomy. CONTRAST:  75mL OMNIPAQUE IOHEXOL 350 MG/ML SOLN COMPARISON:  02/17/2017. FINDINGS: Cardiovascular: Negative for pulmonary embolus. Vascular structures are unremarkable. Heart is enlarged. Left ventricle appears dilated. No pericardial effusion. Mediastinum/Nodes: Mediastinal and hilar lymph nodes  are not enlarged by CT size criteria. No axillary adenopathy. Esophagus is grossly unremarkable. Lungs/Pleura: Slight expiratory phase imaging. Subpleural scarring in the lingula. Lungs are otherwise clear. No pleural fluid. Airway is unremarkable. Upper Abdomen: Visualized portions of the liver, gallbladder, right adrenal gland, spleen, pancreas, stomach and bowel are grossly unremarkable. No upper abdominal adenopathy. Musculoskeletal: Degenerative changes in the spine. No worrisome lytic or sclerotic lesions. Review of the MIP images confirms the above findings. IMPRESSION: 1. Negative for pulmonary embolus. 2. Cardiac enlargement.  Left ventricle appears dilated. Electronically Signed   By: Leanna Battles M.D.   On: 03/14/2018 10:58   US Venous Img Lower Bilateral  Result Date: 03/14/2018 CLINICAL DATA:  Left femoral-popliteal DVT EXAM: BILATERAL LOWER EXTREMITY VENOUS DOPPLER ULTRASOUND TECHNIQUE: Gray-scale sonography with compression, as well as color and duplex ultrasound, were performed to evaluate the deep venous system from the level of the common femoral vein through the popliteal and proximal calf veins. COMPARISON:  03/06/2018 FINDINGS: On the right, occlusive thrombus in the common  femoral vein involving the great saphenous vein and extending through the femoral vein and popliteal vein with no flow signal on color Doppler. Incompressible thrombosed posterior tibial and peroneal veins. On the left, common femoral vein is patent. There is occlusive thrombus in the deep femoral and femoral veins which are noncompressible. Popliteal and posterior tibial veins patent and compressible. IMPRESSION: 1. Interval development of acute RIGHT DVT, occlusive thrombus from calf veins through the femoral-popliteal system. 2. Persistent occlusive thrombus in the LEFT deep femoral and femoral veins as before. Electronically Signed   By: Corlis Leak M.D.   On: 03/14/2018 10:23   US Venous Img Lower Unilateral Left  Result Date: 03/06/2018 CLINICAL DATA:  Left leg pain and swelling. EXAM: LEFT LOWER EXTREMITY VENOUS DOPPLER ULTRASOUND TECHNIQUE: Gray-scale sonography with graded compression, as well as color Doppler and duplex ultrasound were performed to evaluate the lower extremity deep venous systems from the level of the common femoral vein and including the common femoral, femoral, profunda femoral, popliteal and calf veins including the posterior tibial, peroneal and gastrocnemius veins when visible. The superficial great saphenous vein was also interrogated. Spectral Doppler was utilized to evaluate flow at rest and with distal augmentation maneuvers in the common femoral, femoral and popliteal veins. COMPARISON:  02/17/2017 FINDINGS: Contralateral Common Femoral Vein: Respiratory phasicity is normal and symmetric with the symptomatic side. No evidence of thrombus. Normal compressibility. Common Femoral Vein: No evidence of thrombus. Normal compressibility, respiratory phasicity and response to augmentation. Saphenofemoral Junction: Stringy echogenic material in the lumen suggests chronic thrombus. Profunda Femoral Vein: No evidence of thrombus. Femoral Vein: Lumen filled with echogenic material and no  color Doppler signal evident. Muted augmentation and incomplete compressibility. Popliteal Vein: Poorly visualized. Incomplete compressibility with poor color Doppler signal. Calf Veins: Poorly visualized with poor color signal. Compressibility not well evaluated. Other Findings:  None. IMPRESSION: Thrombus identified in the femoral vein with extension into the upper portion of the popliteal vein. The patient had thrombus in this vessel previously and no definite recanalization at this time. Imaging features may be related to residual or recurrent thrombus. Electronically Signed   By: Kennith Center M.D.   On: 03/06/2018 16:06      Assessment/Plan 1.  DVT right lower extremity: The patient has symptomatic DVT of the right lower extremity and therefore thrombolysis is appropriate.  He already has a filter in place.  Given the new worsening symptoms of the left lower extremity I  am suspicious that he has thrombosed his IVC up to his cava.  He denies shortness of breath or pleuritic chest pain and there is no indication that he has had an intervening pulmonary embolism.  He has been maintained on Xarelto.  I have discussed the procedure with the patient and we will plan to move forward with thrombolysis today.  Have also reviewed the possibility of venous stenting and placement of a suprarenal filter.  If indeed suprarenal filter is needed I have been informed the patient that we will plan to remove this in the next 2 to 3 weeks.  I have also discussed that his anticoagulation will now be lifelong.  We touched upon whether he should be converted back to Coumadin but have decided to wait and see how his intervention goes before we make any changes in anticoagulation.  I have also reviewed with the patient the possibility of remaining in the hospital at least 24 to 48 hours on a heparin drip prior to restarting oral anticoagulation.  Risks and benefits were reviewed all questions were answered patient agrees  to proceed with venous thrombolysis.  2.  Hypercoagulability: Patient will be maintained on anticoagulation lifelong  3.  Atrial fibrillation:  Continue antiarrhythmia medications as already ordered, these medications have been reviewed and there are no changes at this time.  Continue anticoagulation as ordered by Cardiology Service    Levora Dredge, MD  03/18/2018 8:23 AM

## 2018-03-18 NOTE — Care Management (Signed)
Patient admitted with DVT.  Patient is self pay.  Goes to [facility] for [facility].  Patient receives xarelto through [employer] enrollment, and picks it up monthly at [facility].  Patient obtains his Lopressor at [facility].  Patient states he has not pick up his Lopressor since last month.  Per documentation from [facility].  Patient has not completed financial documentation for 2019, and will not receive assistance from them until he completes.  Patient is aware and will follow up on Monday.

## 2018-03-18 NOTE — Op Note (Signed)
[facility]                                                                               OPERATIVE NOTE    PRE-OPERATIVE DIAGNOSIS: Symptomatic left and right leg DVT  POST-OPERATIVE DIAGNOSIS: Same  PROCEDURE: 1. Ultrasound guidance for vascular access to the bilateral Superficial femoral veins  2. Catheter placement into the inferior vena cava bilateral femoral vein approach  3. Inferior venacavogram 4. Bilateral lower extremity venogram 5. Angioplasty and stent placement with the Renovo stent bilateral common iliac veins 6. Infusion thrombolysis with 16 mg of TPA 7. Mechanical thrombectomy of the bilateral common femoral veins, bilateral external iliac veins, bilateral common iliac veins and the infrarenal portion of the inferior vena cava using the penumbra cat 8 device  SURGEON: Hortencia Pilar  ASSISTANT(S): None  ANESTHESIA: Conscious sedation was administered by the interventional radiology RN under my direct supervision. IV Versed plus fentanyl were utilized. Continuous ECG, pulse oximetry and blood pressure was monitored throughout the entire procedure.  Conscious sedation was administered for a total of 187 minutes.  ESTIMATED BLOOD LOSS: minimal  Contrast: 100  Fluoroscopy time: 32.3  FINDING(S): 1. Thrombosis of infrarenal IVC; thrombus within the left and right common iliac external iliac and common femoral veins  SPECIMEN(S): none  INDICATIONS:  Henry Dunn is a 42 y.o. year old male who presents with massive swelling of the bilateral legs.  It is quite painful. Risks and benefits including filter thrombosis, migration, fracture, bleeding, and infection were all discussed.   DESCRIPTION: After obtaining full informed written consent, the patient was brought back to the vascular suite. The skin was sterilely prepped and draped and a sterile surgical  field was created in both groin areas.  Ultrasound was placed in a sterile sleeve.  The right superficial femoral vein was image with the ultrasound.  It was echolucent and partially compressible indicating patency associated with thrombotic material.  Image was recorded for the permanent record. The right superficial femoral vein was accessed under direct ultrasound guidance without difficulty with a micropuncture needle and a microwire was advanced without difficulty.   A microsheath was then inserted and then a J-wire was then placed followed by an 8 [place] sheath.  Small hand-injection contrast verified thrombus within the common femoral and iliac venous system.  Glidewire and Kumpe catheter were negotiated up into the inferior vena cava where thrombus within the cava was also confirmed up to the level of the filter.  The catheter and wire were then negotiated beyond the filter in the supra renal cava was imaged.  Several runs were obtained there does not appear to be any thrombus extending above the filter.  Attention was then turned to the groin of the left leg. Ultrasound was placed in a sterile sleeve. Ultrasound is utilized  to gain access to the superficial femoral vein. Vein is known to have thrombus within it by previous ultrasound. It is therefore identified by its enlarged size with heterogenous thrombus and non-compressibility. 1% lidocaine is infiltrated in soft tissues and subsequent microneedle is inserted into the popliteal vein without difficulty. Images recorded for the permanent record and the puncture is made under real-time visualization. Microwire is then advanced under fluoroscopic guidance and noted to follow the path of the iliac veins. Therefore the micro-sheath was placed followed by a Magic torque wire and subsequently an 8 [place] sheath.  2 infusion catheters are prepped on the field and advanced over the wires simultaneously and a total of 16 mg of TPA is reconstituted 20 cc. This  is then laced throughout the bilateral common femoral external iliac and common iliac veins as well as the vena cava. The TPA is then allowed to dwell for 30 minutes. Subsequently, the penumbra catheter is engaged in the aspiration mode and aspiration of the right side is performed.  The common femoral external iliac vein as well as the common iliac vein and the vena cava are then aspirated.  Initial runs were made over the wire and subsequent runs are made with the separator.  Throughout the process small hand-injection of contrast were used to show progress and highlighted areas that demonstrate residual thrombus.  After the vast majority of the thrombus appears to been cleared attention was turned to the left femoral and iliac veins.  Subsequently, the penumbra catheter is engaged in the aspiration mode and aspiration of the right side is performed.  The common femoral external iliac vein as well as the common iliac vein and the vena cava are then aspirated.  Initial runs were made over the wire and subsequent runs are made with the separator.  Throughout the process small hand-injection of contrast were used to show progress and highlighted areas that demonstrate residual thrombus.  Follow-up imaging demonstrates a large amount of thrombotic material has been removed from the left side as well.  There now is flow in the right and left common femoral and iliac system.  Follow-up imaging now demonstrates that almost all the thrombus is been eliminated.  The sheaths are then upsized from 8 [place] to 9 [place] and two 12 mm the Venovo stents are deployed simultaneously the 12 mm x 80 mm stent is on the right extending into the inferior vena cava by approximately 10 mm and the 12 mm x 100 mm stent is on the left matching the initial stent extending into the vena cava by 10 mm.  There then postdilated with a 12 mm balloons.  The leading edge is diagonal laded simultaneously with 8 mm balloons.  Inflations are from 20  to 26 atm for 1 minute.  Follow-up imaging demonstrates the vena cava is now patent and free of thrombus as are both stents and the external iliac veins as well as the common femoral veins.   Given that the patient now is widely patent with minimal residual thrombus the procedure is terminated. The wires removed the sheath is removed pressures held and a pressure dressing with Coban and is then applied.   Interpretation: Initial images demonstrated vena cava has thrombus upto but not above the IVC filter. Initial images the bilateral lower extremity then demonstrated extensive thrombus throughout the iliac and femoral veins however the iliac veins. Following intervention described above there is near-total resolution of thrombus throughout.  COMPLICATIONS: None  CONDITION: Stable  Henry Dunn  Henry Dunn  03/18/2018,5:56 PM

## 2018-03-19 LAB — CBC
HCT: 34 % — ABNORMAL LOW (ref 40.0–52.0)
HCT: 32.5 % — ABNORMAL LOW (ref 40.0–52.0)
Hemoglobin: 11.6 g/dL — ABNORMAL LOW (ref 13.0–18.0)
Hemoglobin: 11.2 g/dL — ABNORMAL LOW (ref 13.0–18.0)
MCH: 30.8 pg (ref 26.0–34.0)
MCH: 31.1 pg (ref 26.0–34.0)
MCHC: 34.2 g/dL (ref 32.0–36.0)
MCHC: 34.4 g/dL (ref 32.0–36.0)
MCV: 90.2 fL (ref 80.0–100.0)
MCV: 90.3 fL (ref 80.0–100.0)
Platelets: 131 10*3/uL — ABNORMAL LOW (ref 150–440)
Platelets: 137 10*3/uL — ABNORMAL LOW (ref 150–440)
RBC: 3.77 MIL/uL — ABNORMAL LOW (ref 4.40–5.90)
RBC: 3.6 MIL/uL — ABNORMAL LOW (ref 4.40–5.90)
RDW: 13.5 % (ref 11.5–14.5)
RDW: 13.4 % (ref 11.5–14.5)
WBC: 9 10*3/uL (ref 3.8–10.6)
WBC: 9.1 10*3/uL (ref 3.8–10.6)

## 2018-03-19 LAB — BASIC METABOLIC PANEL
Anion gap: 4 — ABNORMAL LOW (ref 5–15)
BUN: 9 mg/dL (ref 6–20)
CO2: 28 mmol/L (ref 22–32)
Calcium: 7.9 mg/dL — ABNORMAL LOW (ref 8.9–10.3)
Chloride: 100 mmol/L — ABNORMAL LOW (ref 101–111)
Creatinine, Ser: 0.88 mg/dL (ref 0.61–1.24)
GFR calc Af Amer: >60 mL/min (ref >60)
GFR calc non Af Amer: >60 mL/min (ref >60)
Glucose, Bld: 112 mg/dL — ABNORMAL HIGH (ref 65–99)
Potassium: 3.8 mmol/L (ref 3.5–5.1)
Sodium: 132 mmol/L — ABNORMAL LOW (ref 135–145)

## 2018-03-19 LAB — HEPARIN LEVEL (UNFRACTIONATED)
Heparin Unfractionated: 0.3 IU/mL (ref 0.30–0.70)
Heparin Unfractionated: 0.27 IU/mL — ABNORMAL LOW (ref 0.30–0.70)

## 2018-03-19 MED ORDER — METOPROLOL SUCCINATE ER 25 MG PO TB24
25.0000 mg | ORAL_TABLET | Freq: Every day | ORAL | Status: DC
  Administered 2018-03-20 – 2018-03-22 (×3): 25 mg via ORAL
  Filled 2018-03-19 (×5): qty 1

## 2018-03-19 MED ORDER — ASPIRIN 81 MG PO TBEC: 81.0000 mg | DELAYED_RELEASE_TABLET | Freq: Every day | ORAL | 0 refills | Status: DC

## 2018-03-19 MED ORDER — HEPARIN BOLUS VIA INFUSION
1300.0000 [IU] | Freq: Once | INTRAVENOUS | Status: AC
  Administered 2018-03-19: 1300 [IU] via INTRAVENOUS
  Filled 2018-03-19: qty 1300

## 2018-03-19 MED ORDER — HEPARIN (PORCINE) IN NACL 100-0.45 UNIT/ML-% IJ SOLN
2050.0000 [IU]/h | INTRAMUSCULAR | Status: DC
  Administered 2018-03-20: 1850 [IU]/h via INTRAVENOUS
  Administered 2018-03-21: 1950 [IU]/h via INTRAVENOUS
  Administered 2018-03-21 – 2018-03-23 (×4): 2050 [IU]/h via INTRAVENOUS
  Filled 2018-03-19 (×5): qty 250

## 2018-03-19 MED ORDER — RIVAROXABAN 20 MG PO TABS: 20.0000 mg | ORAL_TABLET | Freq: Every day | ORAL | Status: DC

## 2018-03-19 MED ORDER — RIVAROXABAN 20 MG PO TABS
20.0000 mg | ORAL_TABLET | Freq: Every day | ORAL | Status: DC
Start: 2018-03-19 — End: 2018-03-19
  Administered 2018-03-19: 20 mg via ORAL
  Filled 2018-03-19: qty 1

## 2018-03-19 MED ORDER — BACITRACIN ZINC 500 UNIT/GM EX OINT
TOPICAL_OINTMENT | Freq: Two times a day (BID) | CUTANEOUS | Status: DC
  Administered 2018-03-19 – 2018-03-22 (×8): via TOPICAL
  Administered 2018-03-23: 1 via TOPICAL
  Filled 2018-03-19: qty 28.35

## 2018-03-19 MED ORDER — ACETAMINOPHEN 325 MG PO TABS: 650.0000 mg | ORAL_TABLET | Freq: Four times a day (QID) | ORAL | 0 refills | Status: DC | PRN

## 2018-03-19 NOTE — Progress Notes (Signed)
ANTICOAGULATION CONSULT NOTE -  Follow up Consult  Pharmacy Consult for heparin Indication: DVT  No Known Allergies  Patient Measurements: Height: 5\' 8"  (172.7 cm) Weight: 217 lb 14.4 oz (98.8 kg) IBW/kg (Calculated) : 68.4 Heparin Dosing Weight: 87.1 kg--> 91.9 kg  Vital Signs: Temp: 98.8 F (37.1 C) (04/20 0358) Temp Source: Oral (04/20 0358) BP: 107/67 (04/20 0358) Pulse Rate: 120 (04/20 0358)  Labs: Recent Labs    03/17/18 0826  03/18/18 1307 03/18/18 1901 03/19/18 0151 03/19/18 0806  HGB  --   --   --   --  11.6*  --   HCT  --   --   --   --  34.0*  --   PLT  --   --   --   --  131*  --   APTT  --   --  132* 39*  --   --   LABPROT  --   --  14.4  --   --   --   INR  --   --  1.13  --   --   --   HEPARINUNFRC  --    < > 0.70 <0.10* 0.30 0.27*  CREATININE 0.87  --   --   --  0.88  --    < > = values in this interval not displayed.    Estimated Creatinine Clearance: 124.7 mL/min (by C-G formula based on SCr of 0.88 mg/dL).   Medical History: Past Medical History:  Diagnosis Date  . A-fib (HCC)   . DVT (deep venous thrombosis) (HCC)    a. In setting of dislocated hip, status post IVC filter, previously on Coumadin, noncompliant over the past 12 months  . Hx of blood clots    a. Patient denies any family history of clotting disorder, patient denies prior hypercoagulable workup  . PE (pulmonary thromboembolism) (HCC)     Medications:  Infusions:  . dextrose 5 % and 0.9% NaCl 75 mL/hr at 03/19/18 0729  . heparin 1,700 Units/hr (03/19/18 0401)    Assessment: 42 yom with CC painful blood clot in leg. PMH AF, DVT (in setting of dislocated hip), hx blood clots, PE, patient had previously been on VKA but noncompliant, PTA med list includes Xarelto with last dose documented as past week unknown time. Pharmacy consulted to dose heparin for DVT.  Goal of Therapy:  Heparin level 0.3-0.7 units/ml Monitor platelets by anticoagulation protocol: Yes   Plan:  Give  3000 units bolus x 1 (half bolus requested by vascular due to s/p thrombolysis/thrombectomy) Start heparin infusion at 1400 units/hr Check anti-Xa level in 6 hours and daily while on heparin Continue to monitor H&H and platelets  03/18/18 19:01 HL and aPTT subtherapeutic (levels correlate so dose off HL). 2600 units IV x 1 bolus and increase rate to 1700 units/hr. Will recheck HL in 6 hours.  4/20 0200 heparin level 0.30. Continue current regimen and recheck in 6 hours to confirm.  4/20 0800 HL 0.27 subtherapeutic. RN confirms heparin drip running at 17 ml/hr; no line issues and no s/sx of bleeding noted. RN also stated pt has been receiving lots of fluids (dilutional?).  Will order heparin bolus 1300 units IV x1 then increase drip rate to 1850 units (= 18.5 ml/hr). Next heparin level in 6h. No CBC yesterday to compare today's lab values, will order CBC with next HL as well.   Marty Heck, Pharm.D., BCPS Clinical Pharmacist 03/19/2018,8:50 AM

## 2018-03-19 NOTE — Plan of Care (Signed)
?  Problem: Pain Managment: ?Goal: General experience of comfort will improve ?Outcome: Progressing ?  ?Problem: Cardiovascular: ?Goal: Ability to achieve and maintain adequate cardiovascular perfusion will improve ?Outcome: Progressing ?Goal: Vascular access site(s) Level 0-1 will be maintained ?Outcome: Progressing ?  ?

## 2018-03-19 NOTE — Progress Notes (Signed)
Patient complains of lots of pain in legs with tingling in feet. Dr. Gilda Crease notified to question whether wraps on patients legs were too tight and how long wraps would be on legs. Dr. Gilda Crease stated that the wraps would come off tomorrow when patient was discharged and that they should be tight to compress his legs. Pulses are a 2+ bilaterally and color in feet are pink and warm to touch. Will give patient pain medication and monitor for worsening symptoms.

## 2018-03-19 NOTE — Discharge Instructions (Signed)
Deep Vein Thrombosis Deep vein thrombosis (DVT) is a condition in which a blood clot forms in a deep vein, such as a lower leg, thigh, or arm vein. A clot is blood that has thickened into a gel or solid. This condition is dangerous. It can lead to serious and even life-threatening complications if the clot travels to the lungs and causes a blockage (pulmonary embolism). It can also damage veins in the leg. This can result in leg pain, swelling, discoloration, and sores (post-thrombotic syndrome). What are the causes? This condition may be caused by:  A slowdown of blood flow.  Damage to a vein.  A condition that makes blood clot more easily.  What increases the risk? The following factors may make you more likely to develop this condition:  Being overweight.  Being elderly, especially over age 60.  Sitting or lying down for more than four hours.  Lack of physical activity (sedentary lifestyle).  Being pregnant, giving birth, or having recently given birth.  Taking medicines that contain estrogen.  Smoking.  A history of any of the following: ? Blood clots or blood clotting disease. ? Peripheral vascular disease. ? Inflammatory bowel disease. ? Cancer. ? Heart disease. ? Genetic conditions that affect how blood clots. ? Neurological diseases that affect the legs (leg paresis). ? Injury. ? Major or lengthy surgery. ? A central line placed inside a large vein.  What are the signs or symptoms? Symptoms of this condition include:  Swelling, pain, or tenderness in an arm or leg.  Warmth, redness, or discoloration in an arm or leg.  If the clot is in your leg, symptoms may be more noticeable or worse when you stand or walk. Some people do not have any symptoms. How is this diagnosed? This condition is diagnosed with:  A medical history.  A physical exam.  Tests, such as: ? Blood tests. These are done to see how your blood clots. ? Imaging tests. These are done to  check for clots. Tests may include:  Ultrasound.  CT scan.  MRI.  X-ray.  Venogram. For this test, X-rays are taken after a dye is injected into a vein.  How is this treated? Treatment for this condition depends on the cause, your risk for bleeding or developing more clots, and any medical conditions you have. Treatment may include:  Taking blood thinners (also called anticoagulants). These medicines may be taken by mouth, injected under the skin, or injected through an IV tube (catheter). These medicines prevent clots from forming.  Injecting medicine that dissolves blood clots into the affected vein (catheter-directed thrombolysis).  Having surgery. Surgery may be done to: ? Remove the clot. ? Place a filter in a large vein to catch blood clots before they reach the lungs.  Some treatments may be continued for up to six months. Follow these instructions at home: If you are taking an oral blood thinner:  Take the medicine exactly as told by your health care provider. Some blood thinners need to be taken at the same time every day. Do not skip a dose.  Ask your health care provider about what foods and drugs interact with the medicine.  Ask about possible side effects. General instructions  Blood thinners can cause easy bruising and difficulty stopping bleeding. Because of this, if you are taking or were given a blood thinner: ? Hold pressure over cuts for longer than usual. ? Tell your dentist and other health care providers that you are taking blood thinners before   having any procedures that can cause bleeding. ? Avoid contact sports.  Take over-the-counter and prescription medicines only as told by your health care provider.  Return to your normal activities as told by your health care provider. Ask your health care provider what activities are safe for you.  Wear compression stockings if recommended by your health care provider.  Keep all follow-up visits as told by  your health care provider. This is important. How is this prevented? To lower your risk of developing this condition again:  For 30 or more minutes every day, do an activity that: ? Involves moving your arms and legs. ? Increases your heart rate.  When traveling for longer than four hours: ? Exercise your arms and legs every hour. ? Drink plenty of water. ? Avoid drinking alcohol.  Avoid sitting or lying for a long time without moving your legs.  Stay a healthy weight.  If you are a woman who is older than age 35, avoid unnecessary use of medicines that contain estrogen.  Do not use any products that contain nicotine or tobacco, such as cigarettes and e-cigarettes. This is especially important if you take estrogen medicines. If you need help quitting, ask your health care provider.  Contact a health care provider if:  You miss a dose of your blood thinner.  You have nausea, vomiting, or diarrhea that lasts for more than one day.  Your menstrual period is heavier than usual.  You have unusual bruising. Get help right away if:  You have new or increased pain, swelling, or redness in an arm or leg.  You have numbness or tingling in an arm or leg.  You have shortness of breath.  You have chest pain.  You have a rapid or irregular heartbeat.  You feel light-headed or dizzy.  You cough up blood.  There is blood in your vomit, stool, or urine.  You have a serious fall or accident, or you hit your head.  You have a severe headache or confusion.  You have a cut that will not stop bleeding. These symptoms may represent a serious problem that is an emergency. Do not wait to see if the symptoms will go away. Get medical help right away. Call your local emergency services (911 in the [place] Do not drive yourself to the hospital. Summary  DVT is a condition in which a blood clot forms in a deep vein, such as a lower leg, thigh, or arm vein.  Symptoms can include swelling,  warmth, pain, and redness in your leg or arm.  Treatment may include taking blood thinners, injecting medicine that dissolves blood clots,wearing compression stockings, or surgery.  If you are prescribed blood thinners, take them exactly as told. This information is not intended to replace advice given to you by your health care provider. Make sure you discuss any questions you have with your health care provider. Document Released: 11/16/2005 Document Revised: 12/19/2016 Document Reviewed: 12/19/2016 Elsevier Interactive Patient Education  2018 [employer]  

## 2018-03-19 NOTE — Progress Notes (Signed)
ANTICOAGULATION CONSULT NOTE -  Follow up Consult  Pharmacy Consult for heparin Indication: DVT  No Known Allergies  Patient Measurements: Height: 5\' 8"  (172.7 cm) Weight: 217 lb 14.4 oz (98.8 kg) IBW/kg (Calculated) : 68.4 Heparin Dosing Weight: 87.1 kg--> 91.9 kg  Vital Signs: Temp: 98.8 F (37.1 C) (04/20 0358) Temp Source: Oral (04/20 0358) BP: 107/67 (04/20 0358) Pulse Rate: 120 (04/20 0358)  Labs: Recent Labs    03/17/18 0826  03/18/18 1307 03/18/18 1901 03/19/18 0151 03/19/18 0806  HGB  --   --   --   --  11.6*  --   HCT  --   --   --   --  34.0*  --   PLT  --   --   --   --  131*  --   APTT  --   --  132* 39*  --   --   LABPROT  --   --  14.4  --   --   --   INR  --   --  1.13  --   --   --   HEPARINUNFRC  --    < > 0.70 <0.10* 0.30 0.27*  CREATININE 0.87  --   --   --  0.88  --    < > = values in this interval not displayed.    Estimated Creatinine Clearance: 124.7 mL/min (by C-G formula based on SCr of 0.88 mg/dL).   Medical History: Past Medical History:  Diagnosis Date  . A-fib (HCC)   . DVT (deep venous thrombosis) (HCC)    a. In setting of dislocated hip, status post IVC filter, previously on Coumadin, noncompliant over the past 12 months  . Hx of blood clots    a. Patient denies any family history of clotting disorder, patient denies prior hypercoagulable workup  . PE (pulmonary thromboembolism) (HCC)     Medications:  Infusions:  . dextrose 5 % and 0.9% NaCl 75 mL/hr at 03/19/18 1138  . [START ON 03/20/2018] heparin      Assessment: 42 yom with CC painful blood clot in leg. PMH AF, DVT (in setting of dislocated hip), hx blood clots, PE, patient had previously been on VKA but noncompliant, PTA med list includes Xarelto with last dose documented as past week unknown time. Pharmacy consulted to dose heparin for DVT.  Goal of Therapy:  Heparin level 0.3-0.7 units/ml Monitor platelets by anticoagulation protocol: Yes   Plan:  Give 3000  units bolus x 1 (half bolus requested by vascular due to s/p thrombolysis/thrombectomy) Start heparin infusion at 1400 units/hr Check anti-Xa level in 6 hours and daily while on heparin Continue to monitor H&H and platelets  03/18/18 19:01 HL and aPTT subtherapeutic (levels correlate so dose off HL). 2600 units IV x 1 bolus and increase rate to 1700 units/hr. Will recheck HL in 6 hours.  4/20 0200 heparin level 0.30. Continue current regimen and recheck in 6 hours to confirm.  4/20 0800 HL 0.27 subtherapeutic. RN confirms heparin drip running at 17 ml/hr; no line issues and no s/sx of bleeding noted. RN also stated pt has been receiving lots of fluids (dilutional?).  Will order heparin bolus 1300 units IV x1 then increase drip rate to 1850 units (= 18.5 ml/hr). Next heparin level in 6h. No CBC yesterday to compare today's lab values, will order CBC with next HL as well.   4/20 14:37 patient was to discharge this afternoon but that was delayed. Pharmacy re-consulted  to manage heparin infusion. In the interim patient did receive a dose of Xarelto at 13:44. I spoke with Dr. Evie Lacks and she says it's okay to delay heparin for 24 hrs per protocol due to anti-Xa administration. Will restart heparin infusion tomorrow at 13:45 at the previous rate. Baseline labs to be redrawn at 12:00 tomorrow before restarting infusion. Will dose per protocol thereafter.  Carola Frost, Pharm.D., BCPS Clinical Pharmacist 03/19/2018,2:37 PM

## 2018-03-19 NOTE — Discharge Summary (Signed)
Physician Discharge Summary  Patient ID: Henry Dunn MRN: [id number] DOB/AGE: July 20, 1976 42 y.o.  Admit date: 03/18/2018 Discharge date: 03/19/2018  Admission Diagnoses:  Discharge Diagnoses:  Active Problems:   DVT (deep venous thrombosis) (HCC)   Discharged Condition: good  Hospital Course: Patient admitted for bilateral lower extremity, iliac and vena caval DVTs. Underwent Thrombolysis, thrombectomy and Iliac stenting.  Consults: None  Significant Diagnostic Studies: none  Treatments: surgery: 1. Ultrasound guidance for vascular access to the bilateral Superficial femoral veins  2. Catheter placement into the inferior vena cava bilateral femoral vein approach  3. Inferior venacavogram 4. Bilateral lower extremity venogram 5. Angioplasty and stent placement with the Renovo stent bilateral common iliac veins 6. Infusion thrombolysis with 16 mg of TPA 7. Mechanical thrombectomy of the bilateral common femoral veins, bilateral external iliac veins, bilateral common iliac veins and the infrarenal portion of the inferior vena cava using the penumbra cat 8 device     Discharge Exam: Blood pressure 107/67, pulse (!) 120, temperature 98.8 F (37.1 C), temperature source Oral, resp. rate 18, height 5\' 8"  (1.727 m), weight 98.8 kg (217 lb 14.4 oz), SpO2 96 %. General appearance: alert and no distress Cardio: regular rate and rhythm, S1, S2 normal, no murmur, click, rub or gallop Extremities: edema moderate bilateral lower extremity edema, improved. Palpable pedal pulses bilaterally  Disposition:     Follow-up Information    Schnier, Latina Craver, MD Follow up in 2 week(s).   Specialties:  Vascular Surgery, Cardiology, Radiology, Vascular Surgery Contact information: [address] [place] [address] [phone]           Signed: Bertram Denver 03/19/2018, 12:06 PM

## 2018-03-19 NOTE — Progress Notes (Signed)
ANTICOAGULATION CONSULT NOTE - Initial Consult  Pharmacy Consult for heparin Indication: DVT  No Known Allergies  Patient Measurements: Height: 5\' 8"  (172.7 cm) Weight: 199 lb 15.3 oz (90.7 kg) IBW/kg (Calculated) : 68.4 Heparin Dosing Weight: 87.1 kg  Vital Signs: Temp: 98.8 F (37.1 C) (04/19 1943) Temp Source: Oral (04/19 1943) BP: 106/69 (04/19 2328) Pulse Rate: 96 (04/19 2328)  Labs: Recent Labs    03/17/18 0826 03/18/18 1307 03/18/18 1901 03/19/18 0151  HGB  --   --   --  11.6*  HCT  --   --   --  34.0*  PLT  --   --   --  131*  APTT  --  132* 39*  --   LABPROT  --  14.4  --   --   INR  --  1.13  --   --   HEPARINUNFRC  --  0.70 <0.10* 0.30  CREATININE 0.87  --   --  0.88    Estimated Creatinine Clearance: 119.6 mL/min (by C-G formula based on SCr of 0.88 mg/dL).   Medical History: Past Medical History:  Diagnosis Date  . A-fib (HCC)   . DVT (deep venous thrombosis) (HCC)    a. In setting of dislocated hip, status post IVC filter, previously on Coumadin, noncompliant over the past 12 months  . Hx of blood clots    a. Patient denies any family history of clotting disorder, patient denies prior hypercoagulable workup  . PE (pulmonary thromboembolism) (HCC)     Medications:  Infusions:  . dextrose 5 % and 0.9% NaCl 75 mL/hr at 03/18/18 1611  . heparin 1,700 Units/hr (03/18/18 2020)    Assessment: 42 yom with CC painful blood clot in leg. PMH AF, DVT (in setting of dislocated hip), hx blood clots, PE, patient had previously been on VKA but noncompliant, PTA med list includes Xarelto with last dose documented as past week unknown time. Pharmacy consulted to dose heparin for DVT.  Goal of Therapy:  Heparin level 0.3-0.7 units/ml Monitor platelets by anticoagulation protocol: Yes   Plan:  Give 3000 units bolus x 1 (half bolus requested by vascular due to s/p thrombolysis/thrombectomy) Start heparin infusion at 1400 units/hr Check anti-Xa level in 6  hours and daily while on heparin Continue to monitor H&H and platelets  03/18/18 19:01 HL and aPTT subtherapeutic (levels correlate so dose off HL). 2600 units IV x 1 bolus and increase rate to 1700 units/hr. Will recheck HL in 6 hours.  4/19 0200 heparin level 0.30. Continue current regimen and recheck in 6 hours to confirm.  Christepher Melchior S, Pharm.D., BCPS Clinical Pharmacist 03/19/2018,2:50 AM

## 2018-03-20 ENCOUNTER — Observation Stay: Payer: Medicaid Other

## 2018-03-20 ENCOUNTER — Encounter: Payer: Self-pay | Admitting: *Deleted

## 2018-03-20 DIAGNOSIS — Z7901 Long term (current) use of anticoagulants: Secondary | ICD-10-CM | POA: Diagnosis not present

## 2018-03-20 DIAGNOSIS — F17210 Nicotine dependence, cigarettes, uncomplicated: Secondary | ICD-10-CM | POA: Diagnosis present

## 2018-03-20 DIAGNOSIS — D6859 Other primary thrombophilia: Secondary | ICD-10-CM | POA: Diagnosis present

## 2018-03-20 DIAGNOSIS — Z95828 Presence of other vascular implants and grafts: Secondary | ICD-10-CM | POA: Diagnosis not present

## 2018-03-20 DIAGNOSIS — I82413 Acute embolism and thrombosis of femoral vein, bilateral: Secondary | ICD-10-CM | POA: Diagnosis present

## 2018-03-20 DIAGNOSIS — Z79899 Other long term (current) drug therapy: Secondary | ICD-10-CM | POA: Diagnosis not present

## 2018-03-20 DIAGNOSIS — I4891 Unspecified atrial fibrillation: Secondary | ICD-10-CM | POA: Diagnosis present

## 2018-03-20 DIAGNOSIS — I82423 Acute embolism and thrombosis of iliac vein, bilateral: Secondary | ICD-10-CM | POA: Diagnosis present

## 2018-03-20 DIAGNOSIS — Z86711 Personal history of pulmonary embolism: Secondary | ICD-10-CM | POA: Diagnosis not present

## 2018-03-20 DIAGNOSIS — Z86718 Personal history of other venous thrombosis and embolism: Secondary | ICD-10-CM | POA: Diagnosis not present

## 2018-03-20 LAB — APTT
aPTT: 36 seconds (ref 24–36)
aPTT: 57 seconds — ABNORMAL HIGH (ref 24–36)

## 2018-03-20 LAB — CBC
HCT: 31.8 % — ABNORMAL LOW (ref 40.0–52.0)
Hemoglobin: 11 g/dL — ABNORMAL LOW (ref 13.0–18.0)
MCH: 31.2 pg (ref 26.0–34.0)
MCHC: 34.7 g/dL (ref 32.0–36.0)
MCV: 89.8 fL (ref 80.0–100.0)
Platelets: 153 10*3/uL (ref 150–440)
RBC: 3.54 MIL/uL — ABNORMAL LOW (ref 4.40–5.90)
RDW: 13.4 % (ref 11.5–14.5)
WBC: 8.4 10*3/uL (ref 3.8–10.6)

## 2018-03-20 LAB — HEPARIN LEVEL (UNFRACTIONATED): Heparin Unfractionated: 0.83 IU/mL — ABNORMAL HIGH (ref 0.30–0.70)

## 2018-03-20 LAB — PROTIME-INR
INR: 1.15
Prothrombin Time: 14.6 seconds (ref 11.4–15.2)

## 2018-03-20 NOTE — Progress Notes (Signed)
ANTICOAGULATION CONSULT NOTE -  Follow up Consult  Pharmacy Consult for heparin Indication: DVT  No Known Allergies  Patient Measurements: Height: 5\' 8"  (172.7 cm) Weight: 214 lb 12.8 oz (97.4 kg) IBW/kg (Calculated) : 68.4 Heparin Dosing Weight: 87.1 kg--> 91.9 kg  Vital Signs: Temp: 98.4 F (36.9 C) (04/21 0800) Temp Source: Oral (04/21 0800) BP: 106/75 (04/21 0800) Pulse Rate: 95 (04/21 0800)  Labs: Recent Labs    03/18/18 1307 03/18/18 1901  03/19/18 0151 03/19/18 0806 03/19/18 1600 03/20/18 0422 03/20/18 1226  HGB  --   --    < > 11.6*  --  11.2* 11.0*  --   HCT  --   --   --  34.0*  --  32.5* 31.8*  --   PLT  --   --   --  131*  --  137* 153  --   APTT 132* 39*  --   --   --   --   --  36  LABPROT 14.4  --   --   --   --   --   --  14.6  INR 1.13  --   --   --   --   --   --  1.15  HEPARINUNFRC 0.70 <0.10*  --  0.30 0.27*  --   --  0.83*  CREATININE  --   --   --  0.88  --   --   --   --    < > = values in this interval not displayed.    Estimated Creatinine Clearance: 123.7 mL/min (by C-G formula based on SCr of 0.88 mg/dL).   Medical History: Past Medical History:  Diagnosis Date  . A-fib (HCC)   . DVT (deep venous thrombosis) (HCC)    a. In setting of dislocated hip, status post IVC filter, previously on Coumadin, noncompliant over the past 12 months  . Hx of blood clots    a. Patient denies any family history of clotting disorder, patient denies prior hypercoagulable workup  . PE (pulmonary thromboembolism) (HCC)     Medications:  Infusions:  . dextrose 5 % and 0.9% NaCl 75 mL/hr at 03/19/18 1138  . heparin 1,850 Units/hr (03/20/18 1350)    Assessment: 42 yom with CC painful blood clot in leg. PMH AF, DVT (in setting of dislocated hip), hx blood clots, PE, patient had previously been on VKA but noncompliant, PTA med list includes Xarelto with last dose documented as past week unknown time. Pharmacy consulted to dose heparin for DVT.  Goal of  Therapy:  Heparin level 0.3-0.7 units/ml Monitor platelets by anticoagulation protocol: Yes   Plan:  Give 3000 units bolus x 1 (half bolus requested by vascular due to s/p thrombolysis/thrombectomy) Start heparin infusion at 1400 units/hr Check anti-Xa level in 6 hours and daily while on heparin Continue to monitor H&H and platelets  03/18/18 19:01 HL and aPTT subtherapeutic (levels correlate so dose off HL). 2600 units IV x 1 bolus and increase rate to 1700 units/hr. Will recheck HL in 6 hours.  4/20 0200 heparin level 0.30. Continue current regimen and recheck in 6 hours to confirm.  4/20 0800 HL 0.27 subtherapeutic. RN confirms heparin drip running at 17 ml/hr; no line issues and no s/sx of bleeding noted. RN also stated pt has been receiving lots of fluids (dilutional?).  Will order heparin bolus 1300 units IV x1 then increase drip rate to 1850 units (= 18.5 ml/hr). Next heparin level in  6h. No CBC yesterday to compare today's lab values, will order CBC with next HL as well.   4/20 14:37 patient was to discharge this afternoon but that was delayed. Pharmacy re-consulted to manage heparin infusion. In the interim patient did receive a dose of Xarelto at 13:44. I spoke with Dr. Evie Lacks and she says it's okay to delay heparin for 24 hrs per protocol due to anti-Xa administration. Will restart heparin infusion tomorrow at 13:45 at the previous rate. Baseline labs to be redrawn at 12:00 tomorrow before restarting infusion. Will dose per protocol thereafter.  4/21 Labs before heparin restarted = aPTT 36, INR 1.15, HL 0.83 Heparin drip restarted at previous rate at 1350. Will order aPTT 6 h after restart. Follow aPTT until correlates with HL; HL and CBC in AM.   Marty Heck, Pharm.D., BCPS Clinical Pharmacist 03/20/2018,1:54 PM

## 2018-03-20 NOTE — Progress Notes (Signed)
2 Days Post-Op   Subjective/Chief Complaint: Patient had significant pain and discomfort in bilateral lower extremities Right> Left when ambulating yesterday. Kept in house. Legs rewrapped. Pain control and continued elevation. Doing OK this morning. Complains of Right thigh pain and scrotal swelling.   Objective: Vital signs in last 24 hours: Temp:  [98.2 F (36.8 C)-98.7 F (37.1 C)] 98.4 F (36.9 C) (04/21 0800) Pulse Rate:  [77-102] 95 (04/21 0800) Resp:  [18] 18 (04/21 0800) BP: (91-125)/(61-75) 106/75 (04/21 0800) SpO2:  [94 %-98 %] 98 % (04/21 0800) Weight:  [97.4 kg (214 lb 12.8 oz)] 97.4 kg (214 lb 12.8 oz) (04/21 0429) Last BM Date: 03/16/18  Intake/Output from previous day: 04/20 0701 - 04/21 0700 In: 0  Out: 1925 [Urine:1925] Intake/Output this shift: No intake/output data recorded.  General appearance: alert and no distress Cardio: regular rate and rhythm, S1, S2 normal, no murmur, click, rub or gallop Extremities: edema Bilateral - right> left, scrotal edema, increased from prior exam.  Lab Results:  Recent Labs    03/19/18 1600 03/20/18 0422  WBC 9.1 8.4  HGB 11.2* 11.0*  HCT 32.5* 31.8*  PLT 137* 153   BMET Recent Labs    03/19/18 0151  NA 132*  K 3.8  CL 100*  CO2 28  GLUCOSE 112*  BUN 9  CREATININE 0.88  CALCIUM 7.9*   PT/INR Recent Labs    03/18/18 1307  LABPROT 14.4  INR 1.13   ABG No results for input(s): PHART, HCO3 in the last 72 hours.  Invalid input(s): PCO2, PO2  Studies/Results: No results found.  Anti-infectives: Anti-infectives (From admission, onward)   Start     Dose/Rate Route Frequency Ordered Stop   03/18/18 0845  ceFAZolin (ANCEF) IVPB 2g/100 mL premix  Status:  Discontinued     2 g 200 mL/hr over 30 Minutes Intravenous On call to O.R. 03/18/18 0831 03/18/18 1215   03/18/18 0714  ceFAZolin (ANCEF) 1-4 GM/50ML-% IVPB    Note to Pharmacy:  Si Gaul   : cabinet override      03/18/18 0714 03/18/18 1929    03/17/18 2330  ceFAZolin (ANCEF) IVPB 2g/100 mL premix     2 g 200 mL/hr over 30 Minutes Intravenous  Once 03/17/18 2316 03/18/18 0928      Assessment/Plan: s/p Procedure(s): PERIPHERAL VASCULAR THROMBECTOMY (Right) Resume Heparin gtt OOB as tolerated to chair Pain Control Will obtain right lower extremity venous duplex  LOS: 0 days    Eli Hose A 03/20/2018

## 2018-03-20 NOTE — Progress Notes (Addendum)
ANTICOAGULATION CONSULT NOTE -  Follow up Consult  Pharmacy Consult for heparin Indication: DVT  No Known Allergies  Patient Measurements: Height: 5\' 8"  (172.7 cm) Weight: 214 lb 12.8 oz (97.4 kg) IBW/kg (Calculated) : 68.4 Heparin Dosing Weight: 87.1 kg--> 91.9 kg  Vital Signs: Temp: 97.8 F (36.6 C) (04/21 1927) Temp Source: Oral (04/21 1927) BP: 105/68 (04/21 1927) Pulse Rate: 101 (04/21 1927)  Labs: Recent Labs    03/18/18 1307 03/18/18 1901  03/19/18 0151 03/19/18 0806 03/19/18 1600 03/20/18 0422 03/20/18 1226 03/20/18 1954  HGB  --   --    < > 11.6*  --  11.2* 11.0*  --   --   HCT  --   --   --  34.0*  --  32.5* 31.8*  --   --   PLT  --   --   --  131*  --  137* 153  --   --   APTT 132* 39*  --   --   --   --   --  36 57*  LABPROT 14.4  --   --   --   --   --   --  14.6  --   INR 1.13  --   --   --   --   --   --  1.15  --   HEPARINUNFRC 0.70 <0.10*  --  0.30 0.27*  --   --  0.83*  --   CREATININE  --   --   --  0.88  --   --   --   --   --    < > = values in this interval not displayed.    Estimated Creatinine Clearance: 123.7 mL/min (by C-G formula based on SCr of 0.88 mg/dL).   Medical History: Past Medical History:  Diagnosis Date  . A-fib (HCC)   . DVT (deep venous thrombosis) (HCC)    a. In setting of dislocated hip, status post IVC filter, previously on Coumadin, noncompliant over the past 12 months  . Hx of blood clots    a. Patient denies any family history of clotting disorder, patient denies prior hypercoagulable workup  . PE (pulmonary thromboembolism) (HCC)     Medications:  Infusions:  . dextrose 5 % and 0.9% NaCl 75 mL/hr at 03/19/18 1138  . heparin 1,850 Units/hr (03/20/18 1350)    Assessment: 42 yom with CC painful blood clot in leg. PMH AF, DVT (in setting of dislocated hip), hx blood clots, PE, patient had previously been on VKA but noncompliant, PTA med list includes Xarelto with last dose documented as past week unknown time.  Pharmacy consulted to dose heparin for DVT.  Goal of Therapy:  Heparin level 0.3-0.7 units/ml Monitor platelets by anticoagulation protocol: Yes   Plan:  Give 3000 units bolus x 1 (half bolus requested by vascular due to s/p thrombolysis/thrombectomy) Start heparin infusion at 1400 units/hr Check anti-Xa level in 6 hours and daily while on heparin Continue to monitor H&H and platelets  03/18/18 19:01 HL and aPTT subtherapeutic (levels correlate so dose off HL). 2600 units IV x 1 bolus and increase rate to 1700 units/hr. Will recheck HL in 6 hours.  4/20 0200 heparin level 0.30. Continue current regimen and recheck in 6 hours to confirm.  4/20 0800 HL 0.27 subtherapeutic. RN confirms heparin drip running at 17 ml/hr; no line issues and no s/sx of bleeding noted. RN also stated pt has been receiving lots of fluids (  dilutional?).  Will order heparin bolus 1300 units IV x1 then increase drip rate to 1850 units (= 18.5 ml/hr). Next heparin level in 6h. No CBC yesterday to compare today's lab values, will order CBC with next HL as well.   4/20 14:37 patient was to discharge this afternoon but that was delayed. Pharmacy re-consulted to manage heparin infusion. In the interim patient did receive a dose of Xarelto at 13:44. I spoke with Dr. Evie Lacks and she says it's okay to delay heparin for 24 hrs per protocol due to anti-Xa administration. Will restart heparin infusion tomorrow at 13:45 at the previous rate. Baseline labs to be redrawn at 12:00 tomorrow before restarting infusion. Will dose per protocol thereafter.  4/21 Labs before heparin restarted = aPTT 36, INR 1.15, HL 0.83 Heparin drip restarted at previous rate at 1350. Will order aPTT 6 h after restart. Follow aPTT until correlates with HL; HL and CBC in AM.     4/21@2000  aPTT 57, will increase rate to heparin iv 1950units/hr and recheck with AM labs.  Follow aPTT until correlates with HL  Gerre Pebbles Torey Reinard, Pharm.D., BCPS Clinical  Pharmacist 03/20/2018,8:49 PM

## 2018-03-21 ENCOUNTER — Encounter: Payer: Self-pay | Admitting: Vascular Surgery

## 2018-03-21 DIAGNOSIS — I82402 Acute embolism and thrombosis of unspecified deep veins of left lower extremity: Secondary | ICD-10-CM

## 2018-03-21 DIAGNOSIS — I82401 Acute embolism and thrombosis of unspecified deep veins of right lower extremity: Secondary | ICD-10-CM

## 2018-03-21 LAB — CBC
HCT: 31.9 % — ABNORMAL LOW (ref 40.0–52.0)
Hemoglobin: 10.9 g/dL — ABNORMAL LOW (ref 13.0–18.0)
MCH: 30.9 pg (ref 26.0–34.0)
MCHC: 34.1 g/dL (ref 32.0–36.0)
MCV: 90.7 fL (ref 80.0–100.0)
Platelets: 175 10*3/uL (ref 150–440)
RBC: 3.51 MIL/uL — ABNORMAL LOW (ref 4.40–5.90)
RDW: 14 % (ref 11.5–14.5)
WBC: 7.4 10*3/uL (ref 3.8–10.6)

## 2018-03-21 LAB — APTT
aPTT: 56 seconds — ABNORMAL HIGH (ref 24–36)
aPTT: 74 seconds — ABNORMAL HIGH (ref 24–36)
aPTT: 75 seconds — ABNORMAL HIGH (ref 24–36)

## 2018-03-21 LAB — HEPARIN LEVEL (UNFRACTIONATED): Heparin Unfractionated: 0.29 IU/mL — ABNORMAL LOW (ref 0.30–0.70)

## 2018-03-21 NOTE — Progress Notes (Signed)
[facility]  Daily Progress Note   Subjective  - 3 Days Post-Op  The patient continues to be on heparin.  He is elevating his legs while in bed.  He notes that he is able to sit up now and can move much better than he could last week so in that respect he feels he is better since Friday.  However he also notes that when he stands to walk both of his thighs are still very large and feel very tight.  Objective Vitals:   03/21/18 0500 03/21/18 0507 03/21/18 0803 03/21/18 1634  BP: 99/69 99/69 95/68  112/67  Pulse: 84 84 75 (!) 126  Resp: 18 18    Temp: 98.1 F (36.7 C) 98.1 F (36.7 C) 98.2 F (36.8 C) 98 F (36.7 C)  TempSrc: Oral  Oral Oral  SpO2: 100% 100% 97% 100%  Weight: 208 lb (94.3 kg)     Height:        Intake/Output Summary (Last 24 hours) at 03/21/2018 1757 Last data filed at 03/21/2018 1628 Gross per 24 hour  Intake 1145.53 ml  Output 3325 ml  Net -2179.47 ml    PULM  Normal effort , no use of accessory muscles CV  No JVD, RRR Abd      No distended, nontender VASC  both lower extremities demonstrate severe edema of the thighs it is firm with a mild cyanotic discoloration.  However, below the knee he has minimal edema.  Toes are cyanotic as well when there on the floor.  When the patient's legs are elevated there is no significant cyanosis.  Laboratory CBC    Component Value Date/Time   WBC 7.4 03/21/2018 0414   HGB 10.9 (L) 03/21/2018 0414   HGB 15.9 09/27/2013 0816   HCT 31.9 (L) 03/21/2018 0414   HCT 45.7 09/27/2013 0816   PLT 175 03/21/2018 0414   PLT 216 09/27/2013 0816    BMET    Component Value Date/Time   NA 132 (L) 03/19/2018 0151   NA 136 09/27/2013 0816   K 3.8 03/19/2018 0151   K 3.9 09/27/2013 0816   CL 100 (L) 03/19/2018 0151   CL 105 09/27/2013 0816   CO2 28 03/19/2018 0151   CO2 26 09/27/2013 0816   GLUCOSE 112 (H) 03/19/2018 0151   GLUCOSE 98 09/27/2013 0816   BUN 9 03/19/2018 0151   BUN 15 09/27/2013 0816    CREATININE 0.88 03/19/2018 0151   CREATININE 0.93 09/27/2013 0816   CALCIUM 7.9 (L) 03/19/2018 0151   CALCIUM 9.2 09/27/2013 0816   GFRNONAA >60 03/19/2018 0151   GFRNONAA >60 09/27/2013 0816   GFRAA >60 03/19/2018 0151   GFRAA >60 09/27/2013 0816    Assessment/Planning: POD #3 s/p venous thrombectomy with common iliac vein stenting   Continue heparin for now.  Continue aggressive elevation.  I have discussed the possibilities of traditional conservative therapies using anticoagulation and compression and the fact that this would allow for improvement although slowly.  I have also discussed catheter directed thrombolysis which would likely extend for 24 to 48 hours.  I believe the risks of hemorrhage are 3 to 5% of quoted this to him.  I have also said that I would entertain this only if he is so disabled when he tries to walk that he would be unable to get from room to room in his house.  He states that he is already able to get up and get to the bathroom without much  trouble here at the hospital.  Given this fact I will continue his heparin overnight and readdress the possibility of treatment in the morning but if he is doing reasonably well tomorrow I would favor initiation of Xarelto and then follow-up as an outpatient.    Levora Dredge  03/21/2018, 5:57 PM

## 2018-03-21 NOTE — Progress Notes (Signed)
ANTICOAGULATION CONSULT NOTE -  Follow up Consult  Pharmacy Consult for heparin Indication: DVT  No Known Allergies  Patient Measurements: Height: 5\' 8"  (172.7 cm) Weight: 208 lb (94.3 kg) IBW/kg (Calculated) : 68.4 Heparin Dosing Weight: 87.1 kg--> 91.9 kg  Vital Signs: Temp: 98 F (36.7 C) (04/22 1634) Temp Source: Oral (04/22 1634) BP: 112/67 (04/22 1634) Pulse Rate: 126 (04/22 1634)  Labs: Recent Labs    03/19/18 0151 03/19/18 0806 03/19/18 1600 03/20/18 0422 03/20/18 1226  03/21/18 0414 03/21/18 1219 03/21/18 1829  HGB 11.6*  --  11.2* 11.0*  --   --  10.9*  --   --   HCT 34.0*  --  32.5* 31.8*  --   --  31.9*  --   --   PLT 131*  --  137* 153  --   --  175  --   --   APTT  --   --   --   --  36   < > 56* 74* 75*  LABPROT  --   --   --   --  14.6  --   --   --   --   INR  --   --   --   --  1.15  --   --   --   --   HEPARINUNFRC 0.30 0.27*  --   --  0.83*  --  0.29*  --   --   CREATININE 0.88  --   --   --   --   --   --   --   --    < > = values in this interval not displayed.    Estimated Creatinine Clearance: 121.9 mL/min (by C-G formula based on SCr of 0.88 mg/dL).   Medical History: Past Medical History:  Diagnosis Date  . A-fib (HCC)   . DVT (deep venous thrombosis) (HCC)    a. In setting of dislocated hip, status post IVC filter, previously on Coumadin, noncompliant over the past 12 months  . Hx of blood clots    a. Patient denies any family history of clotting disorder, patient denies prior hypercoagulable workup  . PE (pulmonary thromboembolism) (HCC)     Medications:  Infusions:  . dextrose 5 % and 0.9% NaCl 75 mL/hr at 03/19/18 1138  . heparin 2,050 Units/hr (03/21/18 1619)    Assessment: 42 yom with CC painful blood clot in leg. PMH AF, DVT (in setting of dislocated hip), hx blood clots, PE, patient had previously been on VKA but noncompliant, PTA med list includes Xarelto with last dose documented as past week unknown time. Pharmacy  consulted to dose heparin for DVT.  Goal of Therapy:  Heparin level 0.3-0.7 units/ml Monitor platelets by anticoagulation protocol: Yes   Plan:  Give 3000 units bolus x 1 (half bolus requested by vascular due to s/p thrombolysis/thrombectomy) Start heparin infusion at 1400 units/hr Check anti-Xa level in 6 hours and daily while on heparin Continue to monitor H&H and platelets  03/18/18 19:01 HL and aPTT subtherapeutic (levels correlate so dose off HL). 2600 units IV x 1 bolus and increase rate to 1700 units/hr. Will recheck HL in 6 hours.  4/20 0200 heparin level 0.30. Continue current regimen and recheck in 6 hours to confirm.  4/20 0800 HL 0.27 subtherapeutic. RN confirms heparin drip running at 17 ml/hr; no line issues and no s/sx of bleeding noted. RN also stated pt has been receiving lots of fluids (dilutional?).  Will order heparin bolus 1300 units IV x1 then increase drip rate to 1850 units (= 18.5 ml/hr). Next heparin level in 6h. No CBC yesterday to compare today's lab values, will order CBC with next HL as well.   4/20 14:37 patient was to discharge this afternoon but that was delayed. Pharmacy re-consulted to manage heparin infusion. In the interim patient did receive a dose of Xarelto at 13:44. I spoke with Dr. Evie Lacks and she says it's okay to delay heparin for 24 hrs per protocol due to anti-Xa administration. Will restart heparin infusion tomorrow at 13:45 at the previous rate. Baseline labs to be redrawn at 12:00 tomorrow before restarting infusion. Will dose per protocol thereafter.  4/21 Labs before heparin restarted = aPTT 36, INR 1.15, HL 0.83 Heparin drip restarted at previous rate at 1350. Will order aPTT 6 h after restart. Follow aPTT until correlates with HL; HL and CBC in AM.     4/21@2000  aPTT 57, will increase rate to heparin iv 1950units/hr and recheck with AM labs.  Follow aPTT until correlates with HL  4/22 0400 aPTT 56, heparin level 0.29. Increase rate to 2050  units/hr and recheck aPTT in 6 hours.  4/22 1219 aPTT 74. (goal aPTT 66-102). Will continue current rate and check aPTT/HL in 6 hours.   4/22 1219 aPTT 75. (goal aPTT 66-102). Will continue current rate and check aPTT/HL w am labs   [facility] Jennyfer Nickolson, Pharm.D., BCPS Clinical Pharmacist 03/21/2018,7:09 PM

## 2018-03-21 NOTE — Progress Notes (Signed)
ANTICOAGULATION CONSULT NOTE -  Follow up Consult  Pharmacy Consult for heparin Indication: DVT  No Known Allergies  Patient Measurements: Height: 5\' 8"  (172.7 cm) Weight: 208 lb (94.3 kg) IBW/kg (Calculated) : 68.4 Heparin Dosing Weight: 87.1 kg--> 91.9 kg  Vital Signs: Temp: 98.2 F (36.8 C) (04/22 0803) Temp Source: Oral (04/22 0803) BP: 95/68 (04/22 0803) Pulse Rate: 75 (04/22 0803)  Labs: Recent Labs    03/19/18 0151 03/19/18 0806 03/19/18 1600 03/20/18 0422 03/20/18 1226 03/20/18 1954 03/21/18 0414 03/21/18 1219  HGB 11.6*  --  11.2* 11.0*  --   --  10.9*  --   HCT 34.0*  --  32.5* 31.8*  --   --  31.9*  --   PLT 131*  --  137* 153  --   --  175  --   APTT  --   --   --   --  36 57* 56* 74*  LABPROT  --   --   --   --  14.6  --   --   --   INR  --   --   --   --  1.15  --   --   --   HEPARINUNFRC 0.30 0.27*  --   --  0.83*  --  0.29*  --   CREATININE 0.88  --   --   --   --   --   --   --     Estimated Creatinine Clearance: 121.9 mL/min (by C-G formula based on SCr of 0.88 mg/dL).   Medical History: Past Medical History:  Diagnosis Date  . A-fib (HCC)   . DVT (deep venous thrombosis) (HCC)    a. In setting of dislocated hip, status post IVC filter, previously on Coumadin, noncompliant over the past 12 months  . Hx of blood clots    a. Patient denies any family history of clotting disorder, patient denies prior hypercoagulable workup  . PE (pulmonary thromboembolism) (HCC)     Medications:  Infusions:  . dextrose 5 % and 0.9% NaCl 75 mL/hr at 03/19/18 1138  . heparin 2,050 Units/hr (03/21/18 0556)    Assessment: 42 yom with CC painful blood clot in leg. PMH AF, DVT (in setting of dislocated hip), hx blood clots, PE, patient had previously been on VKA but noncompliant, PTA med list includes Xarelto with last dose documented as past week unknown time. Pharmacy consulted to dose heparin for DVT.  Goal of Therapy:  Heparin level 0.3-0.7  units/ml Monitor platelets by anticoagulation protocol: Yes   Plan:  Give 3000 units bolus x 1 (half bolus requested by vascular due to s/p thrombolysis/thrombectomy) Start heparin infusion at 1400 units/hr Check anti-Xa level in 6 hours and daily while on heparin Continue to monitor H&H and platelets  03/18/18 19:01 HL and aPTT subtherapeutic (levels correlate so dose off HL). 2600 units IV x 1 bolus and increase rate to 1700 units/hr. Will recheck HL in 6 hours.  4/20 0200 heparin level 0.30. Continue current regimen and recheck in 6 hours to confirm.  4/20 0800 HL 0.27 subtherapeutic. RN confirms heparin drip running at 17 ml/hr; no line issues and no s/sx of bleeding noted. RN also stated pt has been receiving lots of fluids (dilutional?).  Will order heparin bolus 1300 units IV x1 then increase drip rate to 1850 units (= 18.5 ml/hr). Next heparin level in 6h. No CBC yesterday to compare today's lab values, will order CBC with next HL  as well.   4/20 14:37 patient was to discharge this afternoon but that was delayed. Pharmacy re-consulted to manage heparin infusion. In the interim patient did receive a dose of Xarelto at 13:44. I spoke with Dr. Evie Lacks and she says it's okay to delay heparin for 24 hrs per protocol due to anti-Xa administration. Will restart heparin infusion tomorrow at 13:45 at the previous rate. Baseline labs to be redrawn at 12:00 tomorrow before restarting infusion. Will dose per protocol thereafter.  4/21 Labs before heparin restarted = aPTT 36, INR 1.15, HL 0.83 Heparin drip restarted at previous rate at 1350. Will order aPTT 6 h after restart. Follow aPTT until correlates with HL; HL and CBC in AM.     4/21@2000  aPTT 57, will increase rate to heparin iv 1950units/hr and recheck with AM labs.  Follow aPTT until correlates with HL  4/22 0400 aPTT 56, heparin level 0.29. Increase rate to 2050 units/hr and recheck aPTT in 6 hours.  4/22 1219 aPTT 74. (goal aPTT 66-102).  Will continue current rate and check aPTT/HL in 6 hours.   Mattew Chriswell A, Pharm.D., BCPS Clinical Pharmacist 03/21/2018,1:17 PM

## 2018-03-21 NOTE — Progress Notes (Signed)
ANTICOAGULATION CONSULT NOTE -  Follow up Consult  Pharmacy Consult for heparin Indication: DVT  No Known Allergies  Patient Measurements: Height: 5\' 8"  (172.7 cm) Weight: 208 lb (94.3 kg) IBW/kg (Calculated) : 68.4 Heparin Dosing Weight: 87.1 kg--> 91.9 kg  Vital Signs: Temp: 98.1 F (36.7 C) (04/22 0500) Temp Source: Oral (04/22 0500) BP: 99/69 (04/22 0500) Pulse Rate: 84 (04/22 0500)  Labs: Recent Labs    03/18/18 1307  03/19/18 0151 03/19/18 0806 03/19/18 1600 03/20/18 0422 03/20/18 1226 03/20/18 1954 03/21/18 0414  HGB  --    < > 11.6*  --  11.2* 11.0*  --   --  10.9*  HCT  --    < > 34.0*  --  32.5* 31.8*  --   --  31.9*  PLT  --    < > 131*  --  137* 153  --   --  175  APTT 132*   < >  --   --   --   --  36 57* 56*  LABPROT 14.4  --   --   --   --   --  14.6  --   --   INR 1.13  --   --   --   --   --  1.15  --   --   HEPARINUNFRC 0.70   < > 0.30 0.27*  --   --  0.83*  --  0.29*  CREATININE  --   --  0.88  --   --   --   --   --   --    < > = values in this interval not displayed.    Estimated Creatinine Clearance: 121.9 mL/min (by C-G formula based on SCr of 0.88 mg/dL).   Medical History: Past Medical History:  Diagnosis Date  . A-fib (HCC)   . DVT (deep venous thrombosis) (HCC)    a. In setting of dislocated hip, status post IVC filter, previously on Coumadin, noncompliant over the past 12 months  . Hx of blood clots    a. Patient denies any family history of clotting disorder, patient denies prior hypercoagulable workup  . PE (pulmonary thromboembolism) (HCC)     Medications:  Infusions:  . dextrose 5 % and 0.9% NaCl 75 mL/hr at 03/19/18 1138  . heparin 1,950 Units/hr (03/21/18 0356)    Assessment: 42 yom with CC painful blood clot in leg. PMH AF, DVT (in setting of dislocated hip), hx blood clots, PE, patient had previously been on VKA but noncompliant, PTA med list includes Xarelto with last dose documented as past week unknown time.  Pharmacy consulted to dose heparin for DVT.  Goal of Therapy:  Heparin level 0.3-0.7 units/ml Monitor platelets by anticoagulation protocol: Yes   Plan:  Give 3000 units bolus x 1 (half bolus requested by vascular due to s/p thrombolysis/thrombectomy) Start heparin infusion at 1400 units/hr Check anti-Xa level in 6 hours and daily while on heparin Continue to monitor H&H and platelets  03/18/18 19:01 HL and aPTT subtherapeutic (levels correlate so dose off HL). 2600 units IV x 1 bolus and increase rate to 1700 units/hr. Will recheck HL in 6 hours.  4/20 0200 heparin level 0.30. Continue current regimen and recheck in 6 hours to confirm.  4/20 0800 HL 0.27 subtherapeutic. RN confirms heparin drip running at 17 ml/hr; no line issues and no s/sx of bleeding noted. RN also stated pt has been receiving lots of fluids (dilutional?).  Will order  heparin bolus 1300 units IV x1 then increase drip rate to 1850 units (= 18.5 ml/hr). Next heparin level in 6h. No CBC yesterday to compare today's lab values, will order CBC with next HL as well.   4/20 14:37 patient was to discharge this afternoon but that was delayed. Pharmacy re-consulted to manage heparin infusion. In the interim patient did receive a dose of Xarelto at 13:44. I spoke with Dr. Evie Lacks and she says it's okay to delay heparin for 24 hrs per protocol due to anti-Xa administration. Will restart heparin infusion tomorrow at 13:45 at the previous rate. Baseline labs to be redrawn at 12:00 tomorrow before restarting infusion. Will dose per protocol thereafter.  4/21 Labs before heparin restarted = aPTT 36, INR 1.15, HL 0.83 Heparin drip restarted at previous rate at 1350. Will order aPTT 6 h after restart. Follow aPTT until correlates with HL; HL and CBC in AM.     4/21@2000  aPTT 57, will increase rate to heparin iv 1950units/hr and recheck with AM labs.  Follow aPTT until correlates with HL  4/22 0400 aPTT 56, heparin level 0.29. Increase rate to  2050 units/hr and recheck aPTT in 6 hours.  Velicia Dejager S, Pharm.D., BCPS Clinical Pharmacist 03/21/2018,5:45 AM

## 2018-03-21 NOTE — Plan of Care (Signed)
  Problem: Pain Managment: Goal: General experience of comfort will improve Outcome: Progressing   Pt still continues to have pain at both groin sites from procedure, treated with 10mg  oxycodone three times this shift, which gave some relief. Still no BM, but pt does not want a laxative. Heparin gtt infusing @ 20.53ml/hr. Bacitracin and mepitel placed on blisters on groin site.

## 2018-03-22 DIAGNOSIS — I82402 Acute embolism and thrombosis of unspecified deep veins of left lower extremity: Secondary | ICD-10-CM

## 2018-03-22 DIAGNOSIS — I82401 Acute embolism and thrombosis of unspecified deep veins of right lower extremity: Secondary | ICD-10-CM

## 2018-03-22 LAB — HEPARIN LEVEL (UNFRACTIONATED)
Heparin Unfractionated: 0.54 IU/mL (ref 0.30–0.70)
Heparin Unfractionated: 0.39 IU/mL (ref 0.30–0.70)

## 2018-03-22 LAB — APTT: aPTT: 96 seconds — ABNORMAL HIGH (ref 24–36)

## 2018-03-22 NOTE — Progress Notes (Signed)
ANTICOAGULATION CONSULT NOTE -  Follow up Consult  Pharmacy Consult for heparin Indication: DVT  No Known Allergies  Patient Measurements: Height: 5\' 8"  (172.7 cm) Weight: 211 lb 1.6 oz (95.8 kg) IBW/kg (Calculated) : 68.4 Heparin Dosing Weight: 87.1 kg--> 91.9 kg  Vital Signs: BP: 111/77 (04/23 0800) Pulse Rate: 106 (04/23 0800)  Labs: Recent Labs    03/20/18 0422  03/20/18 1226  03/21/18 0414 03/21/18 1219 03/21/18 1829 03/22/18 0537 03/22/18 1057  HGB 11.0*  --   --   --  10.9*  --   --   --   --   HCT 31.8*  --   --   --  31.9*  --   --   --   --   PLT 153  --   --   --  175  --   --   --   --   APTT  --   --  36   < > 56* 74* 75* 96*  --   LABPROT  --   --  14.6  --   --   --   --   --   --   INR  --   --  1.15  --   --   --   --   --   --   HEPARINUNFRC  --    < > 0.83*  --  0.29*  --   --  0.54 0.39   < > = values in this interval not displayed.    Estimated Creatinine Clearance: 122.8 mL/min (by C-G formula based on SCr of 0.88 mg/dL).   Medical History: Past Medical History:  Diagnosis Date  . A-fib (HCC)   . DVT (deep venous thrombosis) (HCC)    a. In setting of dislocated hip, status post IVC filter, previously on Coumadin, noncompliant over the past 12 months  . Hx of blood clots    a. Patient denies any family history of clotting disorder, patient denies prior hypercoagulable workup  . PE (pulmonary thromboembolism) (HCC)     Medications:  Infusions:  . dextrose 5 % and 0.9% NaCl Stopped (03/20/18 1900)  . heparin 2,050 Units/hr (03/22/18 1497)    Assessment: 42 yom with CC painful blood clot in leg. PMH AF, DVT (in setting of dislocated hip), hx blood clots, PE, patient had previously been on VKA but noncompliant, PTA med list includes Xarelto with last dose documented as past week unknown time. Pharmacy consulted to dose heparin for DVT.  Goal of Therapy:  Heparin level 0.3-0.7 units/ml Monitor platelets by anticoagulation protocol: Yes    Plan:  Will continue heparin infusion at current rate and recheck a HL in AM.   [place], Lorin Picket D, Pharm.D., BCPS Clinical Pharmacist 03/22/2018,4:04 PM

## 2018-03-22 NOTE — Progress Notes (Signed)
[facility]  Daily Progress Note   Subjective  - 4 Days Post-Op  Patient notes he can get up and move around and does okay.  However, last night he had to have a bowel movement and sitting on the toilet for a while was tough for him.  Nevertheless he feels he is doing pretty well he is concerned about the size of his right thigh  Objective Vitals:   03/21/18 1634 03/21/18 2005 03/22/18 0322 03/22/18 0800  BP: 112/67 132/86 98/64 111/77  Pulse: (!) 126 (!) 103 62 (!) 106  Resp:  18 18 18   Temp: 98 F (36.7 C) 98.4 F (36.9 C) 98.6 F (37 C)   TempSrc: Oral Oral    SpO2: 100% 100% 95% 98%  Weight:   211 lb 1.6 oz (95.8 kg)   Height:        Intake/Output Summary (Last 24 hours) at 03/22/2018 1652 Last data filed at 03/22/2018 0800 Gross per 24 hour  Intake 508.4 ml  Output 1570 ml  Net -1061.6 ml    PULM  Normal effort , no use of accessory muscles CV  No JVD, RRR Abd      No distended, nontender VASC  feet are pink and warm there is minimal ankle edema with elevation.  Both thighs are significantly large with the right one being a bit more firm than the left Laboratory CBC    Component Value Date/Time   WBC 7.4 03/21/2018 0414   HGB 10.9 (L) 03/21/2018 0414   HGB 15.9 09/27/2013 0816   HCT 31.9 (L) 03/21/2018 0414   HCT 45.7 09/27/2013 0816   PLT 175 03/21/2018 0414   PLT 216 09/27/2013 0816    BMET    Component Value Date/Time   NA 132 (L) 03/19/2018 0151   NA 136 09/27/2013 0816   K 3.8 03/19/2018 0151   K 3.9 09/27/2013 0816   CL 100 (L) 03/19/2018 0151   CL 105 09/27/2013 0816   CO2 28 03/19/2018 0151   CO2 26 09/27/2013 0816   GLUCOSE 112 (H) 03/19/2018 0151   GLUCOSE 98 09/27/2013 0816   BUN 9 03/19/2018 0151   BUN 15 09/27/2013 0816   CREATININE 0.88 03/19/2018 0151   CREATININE 0.93 09/27/2013 0816   CALCIUM 7.9 (L) 03/19/2018 0151   CALCIUM 9.2 09/27/2013 0816   GFRNONAA >60 03/19/2018 0151   GFRNONAA >60 09/27/2013  0816   GFRAA >60 03/19/2018 0151   GFRAA >60 09/27/2013 0816    Assessment/Planning: POD #4 s/p venous thrombectomy with common iliac vein stenting      Patient seems to be doing okay.  We again discussed that this is a long-term process.  I will initiate Xarelto therapy at 15 mg twice daily tomorrow morning and we will plan for him to go home tomorrow    Levora Dredge  03/22/2018, 4:52 PM

## 2018-03-22 NOTE — Progress Notes (Signed)
ANTICOAGULATION CONSULT NOTE -  Follow up Consult  Pharmacy Consult for heparin Indication: DVT  No Known Allergies  Patient Measurements: Height: 5\' 8"  (172.7 cm) Weight: 211 lb 1.6 oz (95.8 kg) IBW/kg (Calculated) : 68.4 Heparin Dosing Weight: 87.1 kg--> 91.9 kg  Vital Signs: Temp: 98.6 F (37 C) (04/23 0322) Temp Source: Oral (04/22 2005) BP: 98/64 (04/23 0322) Pulse Rate: 62 (04/23 0322)  Labs: Recent Labs    03/19/18 1600 03/20/18 0422 03/20/18 1226  03/21/18 0414 03/21/18 1219 03/21/18 1829 03/22/18 0537  HGB 11.2* 11.0*  --   --  10.9*  --   --   --   HCT 32.5* 31.8*  --   --  31.9*  --   --   --   PLT 137* 153  --   --  175  --   --   --   APTT  --   --  36   < > 56* 74* 75* 96*  LABPROT  --   --  14.6  --   --   --   --   --   INR  --   --  1.15  --   --   --   --   --   HEPARINUNFRC  --   --  0.83*  --  0.29*  --   --  0.54   < > = values in this interval not displayed.    Estimated Creatinine Clearance: 122.8 mL/min (by C-G formula based on SCr of 0.88 mg/dL).   Medical History: Past Medical History:  Diagnosis Date  . A-fib (HCC)   . DVT (deep venous thrombosis) (HCC)    a. In setting of dislocated hip, status post IVC filter, previously on Coumadin, noncompliant over the past 12 months  . Hx of blood clots    a. Patient denies any family history of clotting disorder, patient denies prior hypercoagulable workup  . PE (pulmonary thromboembolism) (HCC)     Medications:  Infusions:  . dextrose 5 % and 0.9% NaCl Stopped (03/20/18 1900)  . heparin 2,050 Units/hr (03/21/18 1619)    Assessment: 42 yom with CC painful blood clot in leg. PMH AF, DVT (in setting of dislocated hip), hx blood clots, PE, patient had previously been on VKA but noncompliant, PTA med list includes Xarelto with last dose documented as past week unknown time. Pharmacy consulted to dose heparin for DVT.  Goal of Therapy:  Heparin level 0.3-0.7 units/ml Monitor platelets by  anticoagulation protocol: Yes   Plan:  Give 3000 units bolus x 1 (half bolus requested by vascular due to s/p thrombolysis/thrombectomy) Start heparin infusion at 1400 units/hr Check anti-Xa level in 6 hours and daily while on heparin Continue to monitor H&H and platelets  03/18/18 19:01 HL and aPTT subtherapeutic (levels correlate so dose off HL). 2600 units IV x 1 bolus and increase rate to 1700 units/hr. Will recheck HL in 6 hours.  4/20 0200 heparin level 0.30. Continue current regimen and recheck in 6 hours to confirm.  4/20 0800 HL 0.27 subtherapeutic. RN confirms heparin drip running at 17 ml/hr; no line issues and no s/sx of bleeding noted. RN also stated pt has been receiving lots of fluids (dilutional?).  Will order heparin bolus 1300 units IV x1 then increase drip rate to 1850 units (= 18.5 ml/hr). Next heparin level in 6h. No CBC yesterday to compare today's lab values, will order CBC with next HL as well.   4/20 14:37 patient  was to discharge this afternoon but that was delayed. Pharmacy re-consulted to manage heparin infusion. In the interim patient did receive a dose of Xarelto at 13:44. I spoke with Dr. Evie Lacks and she says it's okay to delay heparin for 24 hrs per protocol due to anti-Xa administration. Will restart heparin infusion tomorrow at 13:45 at the previous rate. Baseline labs to be redrawn at 12:00 tomorrow before restarting infusion. Will dose per protocol thereafter.  4/21 Labs before heparin restarted = aPTT 36, INR 1.15, HL 0.83 Heparin drip restarted at previous rate at 1350. Will order aPTT 6 h after restart. Follow aPTT until correlates with HL; HL and CBC in AM.     4/21@2000  aPTT 57, will increase rate to heparin iv 1950units/hr and recheck with AM labs.  Follow aPTT until correlates with HL  4/22 0400 aPTT 56, heparin level 0.29. Increase rate to 2050 units/hr and recheck aPTT in 6 hours.  4/22 1219 aPTT 74. (goal aPTT 66-102). Will continue current rate and  check aPTT/HL in 6 hours.   4/22 1219 aPTT 75. (goal aPTT 66-102). Will continue current rate and check aPTT/HL w am labs   04/23 @ 0530 aPTT 96, HL 0.54, both therapeutic and correlating. Will continue current rate and will dose off HL and will check next one @ 1100.  Thomasene Ripple, Pharm.D., BCPS Clinical Pharmacist 03/22/2018,6:42 AM

## 2018-03-23 DIAGNOSIS — I82401 Acute embolism and thrombosis of unspecified deep veins of right lower extremity: Secondary | ICD-10-CM

## 2018-03-23 DIAGNOSIS — I82402 Acute embolism and thrombosis of unspecified deep veins of left lower extremity: Secondary | ICD-10-CM

## 2018-03-23 LAB — CBC
HCT: 31 % — ABNORMAL LOW (ref 40.0–52.0)
Hemoglobin: 10.8 g/dL — ABNORMAL LOW (ref 13.0–18.0)
MCH: 31.3 pg (ref 26.0–34.0)
MCHC: 34.9 g/dL (ref 32.0–36.0)
MCV: 89.6 fL (ref 80.0–100.0)
Platelets: 233 10*3/uL (ref 150–440)
RBC: 3.46 MIL/uL — ABNORMAL LOW (ref 4.40–5.90)
RDW: 13.7 % (ref 11.5–14.5)
WBC: 6.1 10*3/uL (ref 3.8–10.6)

## 2018-03-23 LAB — HEPARIN LEVEL (UNFRACTIONATED): Heparin Unfractionated: 0.52 IU/mL (ref 0.30–0.70)

## 2018-03-23 MED ORDER — OXYCODONE-ACETAMINOPHEN 5-325 MG PO TABS: 1.0000 | ORAL_TABLET | Freq: Four times a day (QID) | ORAL | 0 refills | Status: DC | PRN

## 2018-03-23 MED ORDER — RIVAROXABAN 20 MG PO TABS: 20.0000 mg | ORAL_TABLET | Freq: Every day | ORAL | 11 refills | Status: DC

## 2018-03-23 MED ORDER — RIVAROXABAN 15 MG PO TABS
15.0000 mg | ORAL_TABLET | Freq: Two times a day (BID) | ORAL | Status: DC
  Administered 2018-03-23: 15 mg via ORAL
  Filled 2018-03-23 (×3): qty 1

## 2018-03-23 MED ORDER — METOPROLOL SUCCINATE ER 25 MG PO TB24: 25.0000 mg | ORAL_TABLET | Freq: Every day | ORAL | 1 refills | Status: DC

## 2018-03-23 MED ORDER — RIVAROXABAN (XARELTO) EDUCATION KIT FOR DVT/PE PATIENTS
PACK | Freq: Once | Status: DC
  Filled 2018-03-23: qty 1

## 2018-03-23 MED ORDER — RIVAROXABAN 15 MG PO TABS: 15.0000 mg | ORAL_TABLET | Freq: Two times a day (BID) | ORAL | 0 refills | Status: DC

## 2018-03-23 MED ORDER — RIVAROXABAN (XARELTO) EDUCATION KIT FOR DVT/PE PATIENTS: 1.0000 | PACK | Freq: Once | 0 refills | Status: AC

## 2018-03-23 NOTE — Discharge Summary (Signed)
[facility]    Discharge Summary    Patient ID:  Henry Dunn MRN: [id number] DOB/AGE: 02/04/1976 42 y.o.  Admit date: 03/18/2018 Discharge date: 03/23/2018 Date of Surgery: 03/18/2018 Surgeon: Surgeon(s): Schnier, Latina Craver, MD  Admission Diagnosis: RT leg thrombectomy    DVT  Discharge Diagnoses:  RT leg thrombectomy    DVT  Secondary Diagnoses: Past Medical History:  Diagnosis Date  . A-fib (HCC)   . DVT (deep venous thrombosis) (HCC)    a. In setting of dislocated hip, status post IVC filter, previously on Coumadin, noncompliant over the past 12 months  . Hx of blood clots    a. Patient denies any family history of clotting disorder, patient denies prior hypercoagulable workup  . PE (pulmonary thromboembolism) (HCC)     Procedure(s): PERIPHERAL VASCULAR THROMBECTOMY  Discharged Condition: fair  HPI:  The patient was admitted to the hospital following thrombolysis procedure.  At the time of his treatment he underwent access to both superficial femoral veins with mechanical thrombectomy and subsequent stenting bilaterally.  Stents were placed in the common iliac veins.  At the conclusion of the procedure the angiographic result appeared to be quite good and he was initiated on heparin drip and kept overnight.  The following morning he reported significant pain and persistent swelling.  He was continued on a heparin drip and continued on bedrest with elevation of his feet.  Sunday his symptoms did not seem to be any better and subsequently a venous duplex was obtained which demonstrated thrombus within the common femoral and external iliac veins.  Given these findings the heparin drip was continued and I discussed with the patient possible treatments.  By Monday he was feeling better.  He stated a much better than I was before the procedure but it still hurts quite a bit when I stand.  On examination both his right and left thighs were still quite large  with the right being more tense than the left.  There was minimal edema of the below the knee area noted bilaterally.  By Tuesday he was able to walk to go to the bathroom.  I therefore transitioned him to Xarelto this morning and feel that he is fit for discharge.  He will be discharged home on Xarelto 15 mg twice daily for 21 days and then transition to 20 mg daily.  Hospital Course:  Henry Dunn is a 42 y.o. male is S/P Bilateral Procedure(s): PERIPHERAL VASCULAR THROMBECTOMY Extubated: POD # 0 Physical exam: Both the right and left thigh are swollen the right is more tense.  Pedal pulses are palpable there is brisk capillary refill Post-op wounds clean, dry, intact or healing well Pt. Ambulating, voiding and taking PO diet without difficulty. Pt pain controlled with PO pain meds. Labs as below Complications:none  Consults:    Significant Diagnostic Studies: CBC Lab Results  Component Value Date   WBC 6.1 03/23/2018   HGB 10.8 (L) 03/23/2018   HCT 31.0 (L) 03/23/2018   MCV 89.6 03/23/2018   PLT 233 03/23/2018    BMET    Component Value Date/Time   NA 132 (L) 03/19/2018 0151   NA 136 09/27/2013 0816   K 3.8 03/19/2018 0151   K 3.9 09/27/2013 0816   CL 100 (L) 03/19/2018 0151   CL 105 09/27/2013 0816   CO2 28 03/19/2018 0151   CO2 26 09/27/2013 0816   GLUCOSE 112 (H) 03/19/2018 0151   GLUCOSE 98 09/27/2013 0816   BUN 9  03/19/2018 0151   BUN 15 09/27/2013 0816   CREATININE 0.88 03/19/2018 0151   CREATININE 0.93 09/27/2013 0816   CALCIUM 7.9 (L) 03/19/2018 0151   CALCIUM 9.2 09/27/2013 0816   GFRNONAA >60 03/19/2018 0151   GFRNONAA >60 09/27/2013 0816   GFRAA >60 03/19/2018 0151   GFRAA >60 09/27/2013 0816   COAG Lab Results  Component Value Date   INR 1.15 03/20/2018   INR 1.13 03/18/2018   INR 0.89 03/14/2018     Disposition:  Discharge to :Home Discharge Instructions    Call MD for:  redness, tenderness, or signs of infection (pain, swelling,  bleeding, redness, odor or green/yellow discharge around incision site)   Complete by:  As directed    Call MD for:  redness, tenderness, or signs of infection (pain, swelling, bleeding, redness, odor or green/yellow discharge around incision site)   Complete by:  As directed    Call MD for:  severe or increased pain, loss or decreased feeling  in affected limb(s)   Complete by:  As directed    Call MD for:  severe or increased pain, loss or decreased feeling  in affected limb(s)   Complete by:  As directed    Call MD for:  temperature >100.5   Complete by:  As directed    Call MD for:  temperature >100.5   Complete by:  As directed    Discharge instructions   Complete by:  As directed    Ambulate as tolerated. Keep legs elevated intermittently during the day and evenings.   Discharge instructions   Complete by:  As directed    OK to shower  Elevate feet as much as possible  OK to use Ace wraps both legs during the day while up, remove at night   Driving Restrictions   Complete by:  As directed    No driving for 3 days   Driving Restrictions   Complete by:  As directed    No driving for one week   Increase activity slowly   Complete by:  As directed    Ambulate as tolerated throughout the day. Elevate extremities. Use compression hose when up ambulating.   No dressing needed   Complete by:  As directed    Replace only if drainage present   Resume previous diet   Complete by:  As directed    Resume previous diet   Complete by:  As directed       Verbal and written Discharge instructions given to the patient. Wound care per Discharge AVS Follow-up Information    Schnier, Latina Craver, MD Follow up in 2 week(s).   Specialties:  Vascular Surgery, Cardiology, Radiology, Vascular Surgery Contact information: [address] [place] [address] [phone]        [facility] Follow up in 2 week(s).   Specialty:  General Practice Contact  information: [address] [place] [address] [phone]           Signed: Levora Dredge, MD  03/23/2018, 2:18 PM

## 2018-03-23 NOTE — Progress Notes (Signed)
IV and tele removed from patient. Discharge instructions given to patient and mother. Verbalized understanding. No distress at this time. Mother at bedside and will transport patient home.

## 2018-03-23 NOTE — Care Management (Signed)
See pervious RNCM note.  Patient is already on chronic xarelto and receives it free trough the [employer] and [employer].  Patient follows up with PCP at [facility].

## 2018-03-23 NOTE — Progress Notes (Signed)
ANTICOAGULATION CONSULT NOTE -  Follow up Consult  Pharmacy Consult for heparin Indication: DVT  No Known Allergies  Patient Measurements: Height: 5\' 8"  (172.7 cm) Weight: 213 lb 1.6 oz (96.7 kg) IBW/kg (Calculated) : 68.4 Heparin Dosing Weight: 87.1 kg--> 91.9 kg  Vital Signs: Temp: 97.5 F (36.4 C) (04/24 0347) Temp Source: Oral (04/24 0347) BP: 102/88 (04/24 0347) Pulse Rate: 73 (04/24 0347)  Labs: Recent Labs    03/20/18 1226  03/21/18 0414 03/21/18 1219 03/21/18 1829 03/22/18 0537 03/22/18 1057 03/23/18 0343  HGB  --   --  10.9*  --   --   --   --  10.8*  HCT  --   --  31.9*  --   --   --   --  31.0*  PLT  --   --  175  --   --   --   --  233  APTT 36   < > 56* 74* 75* 96*  --   --   LABPROT 14.6  --   --   --   --   --   --   --   INR 1.15  --   --   --   --   --   --   --   HEPARINUNFRC 0.83*  --  0.29*  --   --  0.54 0.39 0.52   < > = values in this interval not displayed.    Estimated Creatinine Clearance: 123.3 mL/min (by C-G formula based on SCr of 0.88 mg/dL).   Medical History: Past Medical History:  Diagnosis Date  . A-fib (HCC)   . DVT (deep venous thrombosis) (HCC)    a. In setting of dislocated hip, status post IVC filter, previously on Coumadin, noncompliant over the past 12 months  . Hx of blood clots    a. Patient denies any family history of clotting disorder, patient denies prior hypercoagulable workup  . PE (pulmonary thromboembolism) (HCC)     Medications:  Infusions:  . dextrose 5 % and 0.9% NaCl Stopped (03/20/18 1900)  . heparin 2,050 Units/hr (03/22/18 1808)    Assessment: 42 yom with CC painful blood clot in leg. PMH AF, DVT (in setting of dislocated hip), hx blood clots, PE, patient had previously been on VKA but noncompliant, PTA med list includes Xarelto with last dose documented as past week unknown time. Pharmacy consulted to dose heparin for DVT.  Goal of Therapy:  Heparin level 0.3-0.7 units/ml Monitor platelets by  anticoagulation protocol: Yes   Plan:  Will continue heparin infusion at current rate and recheck a HL in AM.   04/24 @ 0400 HL 0.52 therapeutic. Will continue current rate and will recheck HL/CBC w/ am labs.  Thomasene Ripple, Pharm.D., BCPS Clinical Pharmacist 03/23/2018,5:38 AM

## 2018-04-04 ENCOUNTER — Encounter (INDEPENDENT_AMBULATORY_CARE_PROVIDER_SITE_OTHER): Payer: Self-pay | Admitting: Vascular Surgery

## 2018-04-04 ENCOUNTER — Ambulatory Visit (INDEPENDENT_AMBULATORY_CARE_PROVIDER_SITE_OTHER): Payer: Self-pay | Admitting: Vascular Surgery

## 2018-04-04 VITALS — BP 121/88 | HR 51 | Resp 14 | Ht 68.5 in | Wt 205.0 lb

## 2018-04-04 DIAGNOSIS — I8291 Chronic embolism and thrombosis of unspecified vein: Secondary | ICD-10-CM

## 2018-04-04 DIAGNOSIS — I4892 Unspecified atrial flutter: Secondary | ICD-10-CM

## 2018-04-04 NOTE — Progress Notes (Signed)
MRN : [id number]  Henry Dunn is a 42 y.o. (15-Jul-1976) male who presents with chief complaint of No chief complaint on file. Marland Kitchen  History of Present Illness:   The patient presents to the office for evaluation of DVT.  DVT was identified at [facility] by Duplex ultrasound.  The initial symptoms were pain and swelling in the lower extremity.  He has a history of left leg DVT remotes treated at another institution at which time an IVC filter was placed.  On April 19,2019 he underwent: 1. Ultrasound guidance for vascular access to the bilateral Superficial femoral veins  2. Catheter placement into the inferior vena cava bilateral femoral vein approach  3. Inferior venacavogram 4. Bilateral lower extremity venogram 5. Angioplasty and stent placement with the Renovo stent bilateral common iliac veins 6. Infusion thrombolysis with 16 mg of TPA 7. Mechanical thrombectomy of the bilateral common femoral veins, bilateral external iliac veins, bilateral common iliac veins and the infrarenal portion of the inferior vena cava using the penumbra cat 8 device  The patient notes the leg continues to be very painful with dependency and swells quite a bite.  Symptoms are much better with elevation.  The patient notes less edema in the morning which steadily worsens throughout the day.    The patient has been using knee highcompression therapy at this point.  No SOB or pleuritic chest pains.  No cough or hemoptysis.  No blood per rectum or blood in any sputum.  No excessive bruising per the patient.   No outpatient medications have been marked as taking for the 04/04/18 encounter (Appointment) with Gilda Crease, Latina Craver, MD.    Past Medical History:  Diagnosis Date  . A-fib (HCC)   . DVT (deep venous thrombosis) (HCC)    a. In setting of dislocated hip, status post IVC filter, previously on Coumadin, noncompliant over the past 12 months  . Hx of blood clots    a. Patient denies any family  history of clotting disorder, patient denies prior hypercoagulable workup  . PE (pulmonary thromboembolism) (HCC)     Past Surgical History:  Procedure Laterality Date  . INSERTION OF VENA CAVA FILTER    . PERIPHERAL VASCULAR THROMBECTOMY Right 03/18/2018   Procedure: PERIPHERAL VASCULAR THROMBECTOMY;  Surgeon: Renford Dills, MD;  Location: [facility] INVASIVE CV LAB;  Service: Cardiovascular;  Laterality: Right;    Social History Social History   Tobacco Use  . Smoking status: Current Every Day Smoker    Packs/day: 0.50    Types: Cigarettes  . Smokeless tobacco: Never Used  Substance Use Topics  . Alcohol use: Yes    Alcohol/week: 7.2 oz    Types: 12 Cans of beer per week  . Drug use: Not Currently    Family History Family History  Problem Relation Age of Onset  . Diabetes Neg Hx   . Hypertension Neg Hx     No Known Allergies   REVIEW OF SYSTEMS (Negative unless checked)  Constitutional: [] Weight loss  [] Fever  [] Chills Cardiac: [] Chest pain   [] Chest pressure   [] Palpitations   [] Shortness of breath when laying flat   [] Shortness of breath with exertion. Vascular:  [] Pain in legs with walking   [x] Pain in legs with standing  [] History of DVT   [] Phlebitis   [x] Swelling in legs   [] Varicose veins   [] Non-healing ulcers Pulmonary:   [] Uses home oxygen   [] Productive cough   [] Hemoptysis   [] Wheeze  [] COPD   [] Asthma Neurologic:  []   Dizziness   [] Seizures   [] History of stroke   [] History of TIA  [] Aphasia   [] Vissual changes   [] Weakness or numbness in arm   [] Weakness or numbness in leg Musculoskeletal:   [] Joint swelling   [] Joint pain   [] Low back pain Hematologic:  [] Easy bruising  [] Easy bleeding   [] Hypercoagulable state   [] Anemic Gastrointestinal:  [] Diarrhea   [] Vomiting  [] Gastroesophageal reflux/heartburn   [] Difficulty swallowing. Genitourinary:  [] Chronic kidney disease   [] Difficult urination  [] Frequent urination   [] Blood in urine Skin:  [] Rashes   [] Ulcers   Psychological:  [] History of anxiety   []  History of major depression.  Physical Examination  There were no vitals filed for this visit. There is no height or weight on file to calculate BMI. Gen: WD/WN, NAD Head: [place]/AT, No temporalis wasting.  Ear/Nose/Throat: Hearing grossly intact, nares w/o erythema or drainage Eyes: PER, EOMI, sclera nonicteric.  Neck: Supple, no large masses.   Pulmonary:  Good air movement, no audible wheezing bilaterally, no use of accessory muscles.  Cardiac: RRR, no JVD Vascular: scattered varicosities present bilaterally.  Mild venous stasis changes to the legs bilaterally.  2+ soft pitting edema Vessel Right Left  Radial Palpable Palpable  Gastrointestinal: Non-distended. No guarding/no peritoneal signs.  Musculoskeletal: M/S 5/5 throughout.  No deformity or atrophy.  Neurologic: CN 2-12 intact. Symmetrical.  Speech is fluent. Motor exam as listed above. Psychiatric: Judgment intact, Mood & affect appropriate for pt's clinical situation. Dermatologic: No rashes or ulcers noted.  No changes consistent with cellulitis. Lymph : No lichenification or skin changes of chronic lymphedema.  CBC Lab Results  Component Value Date   WBC 6.1 03/23/2018   HGB 10.8 (L) 03/23/2018   HCT 31.0 (L) 03/23/2018   MCV 89.6 03/23/2018   PLT 233 03/23/2018    BMET    Component Value Date/Time   NA 132 (L) 03/19/2018 0151   NA 136 09/27/2013 0816   K 3.8 03/19/2018 0151   K 3.9 09/27/2013 0816   CL 100 (L) 03/19/2018 0151   CL 105 09/27/2013 0816   CO2 28 03/19/2018 0151   CO2 26 09/27/2013 0816   GLUCOSE 112 (H) 03/19/2018 0151   GLUCOSE 98 09/27/2013 0816   BUN 9 03/19/2018 0151   BUN 15 09/27/2013 0816   CREATININE 0.88 03/19/2018 0151   CREATININE 0.93 09/27/2013 0816   CALCIUM 7.9 (L) 03/19/2018 0151   CALCIUM 9.2 09/27/2013 0816   GFRNONAA >60 03/19/2018 0151   GFRNONAA >60 09/27/2013 0816   GFRAA >60 03/19/2018 0151   GFRAA >60 09/27/2013 0816    Estimated Creatinine Clearance: 123.3 mL/min (by C-G formula based on SCr of 0.88 mg/dL).  COAG Lab Results  Component Value Date   INR 1.15 03/20/2018   INR 1.13 03/18/2018   INR 0.89 03/14/2018    Radiology Ct Angio Chest Pe W And/or Wo Contrast  Result Date: 03/14/2018 CLINICAL DATA:  Acute onset shortness of breath with bilateral groin pain and right leg swelling. History of deep venous thrombosis. EXAM: CT ANGIOGRAPHY CHEST WITH CONTRAST TECHNIQUE: Multidetector CT imaging of the chest was performed using the standard protocol during bolus administration of intravenous contrast. Multiplanar CT image reconstructions and MIPs were obtained to evaluate the vascular anatomy. CONTRAST:  75mL OMNIPAQUE IOHEXOL 350 MG/ML SOLN COMPARISON:  02/17/2017. FINDINGS: Cardiovascular: Negative for pulmonary embolus. Vascular structures are unremarkable. Heart is enlarged. Left ventricle appears dilated. No pericardial effusion. Mediastinum/Nodes: Mediastinal and hilar lymph nodes are not enlarged  by CT size criteria. No axillary adenopathy. Esophagus is grossly unremarkable. Lungs/Pleura: Slight expiratory phase imaging. Subpleural scarring in the lingula. Lungs are otherwise clear. No pleural fluid. Airway is unremarkable. Upper Abdomen: Visualized portions of the liver, gallbladder, right adrenal gland, spleen, pancreas, stomach and bowel are grossly unremarkable. No upper abdominal adenopathy. Musculoskeletal: Degenerative changes in the spine. No worrisome lytic or sclerotic lesions. Review of the MIP images confirms the above findings. IMPRESSION: 1. Negative for pulmonary embolus. 2. Cardiac enlargement.  Left ventricle appears dilated. Electronically Signed   By: Leanna Battles M.D.   On: 03/14/2018 10:58   US Venous Img Lower Bilateral  Result Date: 03/14/2018 CLINICAL DATA:  Left femoral-popliteal DVT EXAM: BILATERAL LOWER EXTREMITY VENOUS DOPPLER ULTRASOUND TECHNIQUE: Gray-scale sonography with  compression, as well as color and duplex ultrasound, were performed to evaluate the deep venous system from the level of the common femoral vein through the popliteal and proximal calf veins. COMPARISON:  03/06/2018 FINDINGS: On the right, occlusive thrombus in the common femoral vein involving the great saphenous vein and extending through the femoral vein and popliteal vein with no flow signal on color Doppler. Incompressible thrombosed posterior tibial and peroneal veins. On the left, common femoral vein is patent. There is occlusive thrombus in the deep femoral and femoral veins which are noncompressible. Popliteal and posterior tibial veins patent and compressible. IMPRESSION: 1. Interval development of acute RIGHT DVT, occlusive thrombus from calf veins through the femoral-popliteal system. 2. Persistent occlusive thrombus in the LEFT deep femoral and femoral veins as before. Electronically Signed   By: Corlis Leak M.D.   On: 03/14/2018 10:23   US Venous Img Lower Unilateral Left  Result Date: 03/06/2018 CLINICAL DATA:  Left leg pain and swelling. EXAM: LEFT LOWER EXTREMITY VENOUS DOPPLER ULTRASOUND TECHNIQUE: Gray-scale sonography with graded compression, as well as color Doppler and duplex ultrasound were performed to evaluate the lower extremity deep venous systems from the level of the common femoral vein and including the common femoral, femoral, profunda femoral, popliteal and calf veins including the posterior tibial, peroneal and gastrocnemius veins when visible. The superficial great saphenous vein was also interrogated. Spectral Doppler was utilized to evaluate flow at rest and with distal augmentation maneuvers in the common femoral, femoral and popliteal veins. COMPARISON:  02/17/2017 FINDINGS: Contralateral Common Femoral Vein: Respiratory phasicity is normal and symmetric with the symptomatic side. No evidence of thrombus. Normal compressibility. Common Femoral Vein: No evidence of thrombus.  Normal compressibility, respiratory phasicity and response to augmentation. Saphenofemoral Junction: Stringy echogenic material in the lumen suggests chronic thrombus. Profunda Femoral Vein: No evidence of thrombus. Femoral Vein: Lumen filled with echogenic material and no color Doppler signal evident. Muted augmentation and incomplete compressibility. Popliteal Vein: Poorly visualized. Incomplete compressibility with poor color Doppler signal. Calf Veins: Poorly visualized with poor color signal. Compressibility not well evaluated. Other Findings:  None. IMPRESSION: Thrombus identified in the femoral vein with extension into the upper portion of the popliteal vein. The patient had thrombus in this vessel previously and no definite recanalization at this time. Imaging features may be related to residual or recurrent thrombus. Electronically Signed   By: Kennith Center M.D.   On: 03/06/2018 16:06   US Venous Img Lower Unilateral Right  Result Date: 03/20/2018 CLINICAL DATA:  42 year old male with right lower extremity swelling. Patient recently underwent bilateral lower extremity DVT thrombolysis/thrombectomy on 03/18/2018 and now has recurrent symptoms. EXAM: RIGHT LOWER EXTREMITY VENOUS DOPPLER ULTRASOUND TECHNIQUE: Gray-scale sonography with graded compression,  as well as color Doppler and duplex ultrasound were performed to evaluate the lower extremity deep venous systems from the level of the common femoral vein and including the common femoral, femoral, profunda femoral, popliteal and calf veins including the posterior tibial, peroneal and gastrocnemius veins when visible. The superficial great saphenous vein was also interrogated. Spectral Doppler was utilized to evaluate flow at rest and with distal augmentation maneuvers in the common femoral, femoral and popliteal veins. COMPARISON:  None. FINDINGS: Contralateral Common Femoral Vein: Abnormal. Patient has near occlusive thrombus within the common femoral  vein. The lumen is expanded and filled with low-level internal echoes. The vessel is not compressible. There is trace flow on color Doppler imaging. Common Femoral Vein: Partially compressible. Nonocclusive DVT adherent to the common femoral vein wall. The vessel is partially compressible. There is a channel of flow on color Doppler imaging. Saphenofemoral Junction: Nonocclusive thrombus extends into the saphenofemoral junction. Profunda Femoral Vein: Occlusive thrombus extends into the profunda femoral vein. Femoral Vein: Occlusive thrombus extends throughout the femoral vein in the thigh. Popliteal Vein: Occlusive thrombus extends into the popliteal vein. Calf Veins: Occlusive thrombus extends into the calf veins. Superficial Great Saphenous Vein: No evidence of thrombus. Normal compressibility. Venous Reflux:  None. Other Findings:  None. IMPRESSION: 1. Positive for extensive right lower extremity DVT including nonocclusive thrombus in the common femoral vein and occlusive thrombus in the profunda femoral vein, femoral vein, popliteal vein and calf veins. 2. Positive for near occlusive DVT involving the contralateral left common femoral vein. Electronically Signed   By: Malachy Moan M.D.   On: 03/20/2018 12:16    Assessment/Plan 1. 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  IVC filter is present.  Patient's previous duplex ultrasound of the venous system shows DVT to the femoral veins bilaterally.  The patient is on anticoagulation   Elevation was stressed, use of a recliner was discussed.  I have had a long discussion with the patient regarding DVT and post phlebitic changes such as swelling and why it  causes symptoms such as pain.  The patient will wear graduated compression stockings class 1 (20-30 mmHg), beginning after three full days of anticoagulation, on a daily basis.  The possibility of using a  full Pantyhose was discussed  The patient will  continue wearing the stockings first thing in the morning and removing them in the evening. The patient is instructed specifically not to sleep in the stockings.  In addition, behavioral modification including elevation during the day and avoidance of prolonged dependency will be initiated.    The patient will continue anticoagulation for now as there have not been any problems or complications at this point.   Overall Mr. Bostelman is doing reasonably well considering the extensive nature of the DVT he experienced.  He is tolerating his anticoagulation and this will continue as noted above.  I am still concerned regarding his ability to stand for long periods of time.  He did relate to me that just trying to do a few small things around the house his legs, particularly his thighs, start feeling very tight and become quite painful.  I would anticipate that this would get better with time but have no way of knowing the extent of the disability this will cause.  And will most definitely be an ongoing problem to some extent.  I have asked that he follow-up with me in 1 month  so that we can see how well he is doing with increased activity and longer periods of dependency.   2. New onset atrial flutter (HCC) Continue antiarrhythmia medications as already ordered, these medications have been reviewed and there are no changes at this time.  Continue anticoagulation as ordered by Medical Service     Levora Dredge, MD  04/04/2018 8:34 AM

## 2018-05-05 ENCOUNTER — Encounter (INDEPENDENT_AMBULATORY_CARE_PROVIDER_SITE_OTHER): Payer: Self-pay | Admitting: Vascular Surgery

## 2018-05-05 ENCOUNTER — Ambulatory Visit (INDEPENDENT_AMBULATORY_CARE_PROVIDER_SITE_OTHER): Payer: Self-pay | Admitting: Vascular Surgery

## 2018-05-05 VITALS — BP 124/90 | HR 77 | Resp 16 | Ht 68.5 in | Wt 207.0 lb

## 2018-05-05 DIAGNOSIS — I4892 Unspecified atrial flutter: Secondary | ICD-10-CM

## 2018-05-05 DIAGNOSIS — I8291 Chronic embolism and thrombosis of unspecified vein: Secondary | ICD-10-CM

## 2018-05-05 NOTE — Progress Notes (Signed)
MRN : [id number]  Henry Dunn is a 42 y.o. (1976/03/24) male who presents with chief complaint of  Chief Complaint  Patient presents with  . Follow-up    59month no studies  .  History of Present Illness:   The patient presents to the office for evaluation of DVT.  DVT was identified at [facility] by Duplex ultrasound.  The initial symptoms were pain and swelling in the lower extremity.  He has a history of left leg DVT remotes treated at another institution at which time an IVC filter was placed.  On April 19,2019 he underwent: 1. Ultrasound guidance for vascular access to the bilateral Superficialfemoral veins 2. Catheter placement into the inferior vena cava bilateralfemoral vein approach 3. Inferior venacavogram 4. Bilateral lower extremity venogram 5. Angioplasty and stent placement with the Renovo stent bilateral common iliac veins 6. Infusion thrombolysis with 16 mg of TPA 7. Mechanical thrombectomy of the bilateralcommon femoral veins,bilateral external iliac veins, bilateral common iliac veins and the infrarenal portion of the inferior vena cavausing the penumbra cat 8 device  The patient notes the leg continues to be some what tight with dependency and swells a bite.  Symptoms are much better with elevation.  The patient notes less edema in the morning which steadily worsens throughout the day.  Overall he feels he is better than the last visit   The patient has been using knee highcompression therapy at this point.  No SOB or pleuritic chest pains.  No cough or hemoptysis.  No blood per rectum or blood in any sputum.  No excessive bruising per the patient.     Current Meds  Medication Sig  . acetaminophen (TYLENOL) 325 MG tablet Take 2 tablets (650 mg total) by mouth every 6 (six) hours as needed for mild pain (or Fever >/= 101).  . metoprolol succinate (TOPROL-XL) 25 MG 24 hr tablet Take 25 mg by mouth daily.  . metoprolol succinate  (TOPROL-XL) 25 MG 24 hr tablet Take 1 tablet (25 mg total) by mouth daily.  . rivaroxaban (XARELTO) 20 MG TABS tablet Take 1 tablet (20 mg total) by mouth daily with supper. This is to begin after he has finished the 15 mg bid Prescription  For 21 days  . XARELTO 20 MG TABS tablet Take 20 mg by mouth daily.    Past Medical History:  Diagnosis Date  . A-fib (HCC)   . DVT (deep venous thrombosis) (HCC)    a. In setting of dislocated hip, status post IVC filter, previously on Coumadin, noncompliant over the past 12 months  . Hx of blood clots    a. Patient denies any family history of clotting disorder, patient denies prior hypercoagulable workup  . PE (pulmonary thromboembolism) (HCC)     Past Surgical History:  Procedure Laterality Date  . INSERTION OF VENA CAVA FILTER    . PERIPHERAL VASCULAR THROMBECTOMY Right 03/18/2018   Procedure: PERIPHERAL VASCULAR THROMBECTOMY;  Surgeon: Renford Dills, MD;  Location: [facility] INVASIVE CV LAB;  Service: Cardiovascular;  Laterality: Right;    Social History Social History   Tobacco Use  . Smoking status: Current Every Day Smoker    Packs/day: 0.50    Types: Cigarettes  . Smokeless tobacco: Never Used  Substance Use Topics  . Alcohol use: Yes    Alcohol/week: 7.2 oz    Types: 12 Cans of beer per week  . Drug use: Not Currently    Family History Family History  Problem Relation Age of  Onset  . Diabetes Neg Hx   . Hypertension Neg Hx     No Known Allergies   REVIEW OF SYSTEMS (Negative unless checked)  Constitutional: [] Weight loss  [] Fever  [] Chills Cardiac: [] Chest pain   [] Chest pressure   [] Palpitations   [] Shortness of breath when laying flat   [] Shortness of breath with exertion. Vascular:  [] Pain in legs with walking   [x] Pain in legs at rest  [x] History of DVT   [] Phlebitis   [x] Swelling in legs   [] Varicose veins   [] Non-healing ulcers Pulmonary:   [] Uses home oxygen   [] Productive cough   [] Hemoptysis   [] Wheeze  [] COPD    [] Asthma Neurologic:  [] Dizziness   [] Seizures   [] History of stroke   [] History of TIA  [] Aphasia   [] Vissual changes   [] Weakness or numbness in arm   [] Weakness or numbness in leg Musculoskeletal:   [] Joint swelling   [] Joint pain   [] Low back pain Hematologic:  [] Easy bruising  [] Easy bleeding   [] Hypercoagulable state   [] Anemic Gastrointestinal:  [] Diarrhea   [] Vomiting  [] Gastroesophageal reflux/heartburn   [] Difficulty swallowing. Genitourinary:  [] Chronic kidney disease   [] Difficult urination  [] Frequent urination   [] Blood in urine Skin:  [] Rashes   [] Ulcers  Psychological:  [] History of anxiety   []  History of major depression.  Physical Examination  Vitals:   05/05/18 0905  BP: 124/90  Pulse: 77  Resp: 16  Weight: 207 lb (93.9 kg)  Height: 5' 8.5" (1.74 m)   Body mass index is 31.02 kg/m. Gen: WD/WN, NAD Head: [place]/AT, No temporalis wasting.  Ear/Nose/Throat: Hearing grossly intact, nares w/o erythema or drainage Eyes: PER, EOMI, sclera nonicteric.  Neck: Supple, no large masses.   Pulmonary:  Good air movement, no audible wheezing bilaterally, no use of accessory muscles.  Cardiac: RRR, no JVD Vascular: scattered varicosities present bilaterally.  Moderate venous stasis changes to the legs bilaterally.  2-3+ soft pitting edema Vessel Right Left  Radial Palpable Palpable  PT Palpable Palpable  DP Palpable Palpable  Gastrointestinal: Non-distended. No guarding/no peritoneal signs.  Musculoskeletal: M/S 5/5 throughout.  No deformity or atrophy.  Neurologic: CN 2-12 intact. Symmetrical.  Speech is fluent. Motor exam as listed above. Psychiatric: Judgment intact, Mood & affect appropriate for pt's clinical situation. Dermatologic: Venous rashes no ulcers noted.  No changes consistent with cellulitis. Lymph : No lichenification or skin changes of chronic lymphedema.  CBC Lab Results  Component Value Date   WBC 6.1 03/23/2018   HGB 10.8 (L) 03/23/2018   HCT 31.0  (L) 03/23/2018   MCV 89.6 03/23/2018   PLT 233 03/23/2018    BMET    Component Value Date/Time   NA 132 (L) 03/19/2018 0151   NA 136 09/27/2013 0816   K 3.8 03/19/2018 0151   K 3.9 09/27/2013 0816   CL 100 (L) 03/19/2018 0151   CL 105 09/27/2013 0816   CO2 28 03/19/2018 0151   CO2 26 09/27/2013 0816   GLUCOSE 112 (H) 03/19/2018 0151   GLUCOSE 98 09/27/2013 0816   BUN 9 03/19/2018 0151   BUN 15 09/27/2013 0816   CREATININE 0.88 03/19/2018 0151   CREATININE 0.93 09/27/2013 0816   CALCIUM 7.9 (L) 03/19/2018 0151   CALCIUM 9.2 09/27/2013 0816   GFRNONAA >60 03/19/2018 0151   GFRNONAA >60 09/27/2013 0816   GFRAA >60 03/19/2018 0151   GFRAA >60 09/27/2013 0816   CrCl cannot be calculated (Patient's most recent lab result is older than  the maximum 21 days allowed.).  COAG Lab Results  Component Value Date   INR 1.15 03/20/2018   INR 1.13 03/18/2018   INR 0.89 03/14/2018    Radiology No results found.    Assessment/Plan 1. 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  IVC filter is present.  He states he is doing better and he does not swell nearly as much as before.  He would like to go back to work.  I have discussed this with him and stressed that he should be wearing compression at work.  OK tt return to work.  Patient's previous duplex ultrasound of the venous system shows DVT from the popliteal to the femoral veins.  The patient continues on anticoagulation   Elevation was stressed, use of a recliner was discussed.  I have had a long discussion with the patient regarding DVT and post phlebitic changes such as swelling and why it  causes symptoms such as pain.  The patient will wear graduated compression stockings class 1 (20-30 mmHg), beginning after three full days of anticoagulation, on a daily basis a prescription was given. The patient will  beginning wearing the stockings first  thing in the morning and removing them in the evening. The patient is instructed specifically not to sleep in the stockings.  In addition, behavioral modification including elevation during the day and avoidance of prolonged dependency will be initiated.    The patient will continue anticoagulation for now as there have not been any problems or complications at this point.  2. New onset atrial flutter (HCC) Continue antiarrhythmia medications as already ordered, these medications have been reviewed and there are no changes at this time.  Continue anticoagulation as ordered by Cardiology Service       Levora Dredge, MD  05/05/2018 9:13 AM

## 2018-05-07 ENCOUNTER — Other Ambulatory Visit: Payer: Self-pay | Admitting: Cardiovascular Disease

## 2018-05-09 NOTE — Telephone Encounter (Signed)
Please review for refill, Thanks !  

## 2018-06-17 ENCOUNTER — Telehealth (INDEPENDENT_AMBULATORY_CARE_PROVIDER_SITE_OTHER): Payer: Self-pay | Admitting: Vascular Surgery

## 2018-06-17 NOTE — Telephone Encounter (Signed)
LVM to confirm appt

## 2018-06-20 ENCOUNTER — Encounter (INDEPENDENT_AMBULATORY_CARE_PROVIDER_SITE_OTHER): Payer: Self-pay | Admitting: Vascular Surgery

## 2018-06-20 ENCOUNTER — Ambulatory Visit (INDEPENDENT_AMBULATORY_CARE_PROVIDER_SITE_OTHER): Payer: Self-pay | Admitting: Vascular Surgery

## 2018-06-20 VITALS — BP 115/86 | HR 40 | Resp 14 | Ht 68.5 in | Wt 200.0 lb

## 2018-06-20 DIAGNOSIS — I87009 Postthrombotic syndrome without complications of unspecified extremity: Secondary | ICD-10-CM

## 2018-06-20 DIAGNOSIS — I825Y2 Chronic embolism and thrombosis of unspecified deep veins of left proximal lower extremity: Secondary | ICD-10-CM

## 2018-06-20 DIAGNOSIS — M79605 Pain in left leg: Secondary | ICD-10-CM

## 2018-06-21 ENCOUNTER — Encounter (INDEPENDENT_AMBULATORY_CARE_PROVIDER_SITE_OTHER): Payer: Self-pay | Admitting: Vascular Surgery

## 2018-06-21 DIAGNOSIS — I87009 Postthrombotic syndrome without complications of unspecified extremity: Secondary | ICD-10-CM | POA: Insufficient documentation

## 2018-06-21 DIAGNOSIS — M79605 Pain in left leg: Secondary | ICD-10-CM | POA: Insufficient documentation

## 2018-06-21 NOTE — Progress Notes (Signed)
MRN : [id number]  Henry Dunn is a 42 y.o. (12/06/1975) male who presents with chief complaint of  Chief Complaint  Patient presents with  . Follow-up    Leg swelling, limping while walking  .  History of Present Illness:   The patient presents to the office for evaluation of DVT. DVT was identified at [facility] by Duplex ultrasound. The initial symptoms were severe 10 out of 10 pain and massive swelling of the left lower extremity. He has a history of left leg DVT remotely treated at another institution at which time an IVC filter was placed.   On April 19,2019 he underwent: 1. Ultrasound guidance for vascular access to the bilateral Superficialfemoral veins 2. Catheter placement into the inferior vena cava bilateralfemoral vein approach 3. Inferior venacavogram 4. Bilateral lower extremity venogram 5. Angioplasty and stent placement with the Renovo stent bilateral common iliac veins 6. Infusion thrombolysis with 16 mg of TPA 7. Mechanical thrombectomy of the bilateralcommon femoral veins,bilateral external iliac veins, bilateral common iliac veins and the infrarenal portion of the inferior vena cavausing the penumbra cat 8 device  After the procedure the patient noted that his left leg continued to be tight  and painful with dependency and that it still swells a bite. Symptoms are much better with elevation. The patient notes lessedema in the morning which steadily worsens throughout the day.  Overall, at his last visit he felt that he was better and wanted to return to work.  At the time I expressed my concerns given his job and the amount of standing that it requires that he would not do well.  Nevertheless we agreed that he would return to work as a trial to see.  Today he returns to the office for follow-up.  He relates that he has not done well at all in fact he has failed miserably at work.  He is able to stand for 15 to 20 minutes at a time and then the  pain becomes very intense.  He is unable to elevate at work although he can sit his feet remain dependent.  By the end of his shift even with extra breaks his leg is again massively swollen and so painful that he "has to limp".  The patient has been usingknee highcompression therapy at this point.  No SOB or pleuritic chest pains. No cough or hemoptysis.  No blood per rectum or blood in any sputum. No excessive bruising per the patient.     No outpatient medications have been marked as taking for the 06/20/18 encounter (Office Visit) with Gilda Crease, Latina Craver, MD.    Past Medical History:  Diagnosis Date  . A-fib (HCC)   . DVT (deep venous thrombosis) (HCC)    a. In setting of dislocated hip, status post IVC filter, previously on Coumadin, noncompliant over the past 12 months  . Hx of blood clots    a. Patient denies any family history of clotting disorder, patient denies prior hypercoagulable workup  . PE (pulmonary thromboembolism) (HCC)     Past Surgical History:  Procedure Laterality Date  . INSERTION OF VENA CAVA FILTER    . PERIPHERAL VASCULAR THROMBECTOMY Right 03/18/2018   Procedure: PERIPHERAL VASCULAR THROMBECTOMY;  Surgeon: Renford Dills, MD;  Location: [facility] INVASIVE CV LAB;  Service: Cardiovascular;  Laterality: Right;    Social History Social History   Tobacco Use  . Smoking status: Current Every Day Smoker    Packs/day: 0.50    Types: Cigarettes  .  Smokeless tobacco: Never Used  Substance Use Topics  . Alcohol use: Yes    Alcohol/week: 7.2 oz    Types: 12 Cans of beer per week  . Drug use: Not Currently    Family History Family History  Problem Relation Age of Onset  . Diabetes Neg Hx   . Hypertension Neg Hx     No Known Allergies   REVIEW OF SYSTEMS (Negative unless checked)  Constitutional: [] Weight loss  [] Fever  [] Chills Cardiac: [] Chest pain   [] Chest pressure   [] Palpitations   [] Shortness of breath when laying flat   [] Shortness of  breath with exertion. Vascular:  [x] Pain in legs with walking   [x] Pain in legs at rest  [x] History of DVT   [] Phlebitis   [x] Swelling in legs   [] Varicose veins   [] Non-healing ulcers Pulmonary:   [] Uses home oxygen   [] Productive cough   [] Hemoptysis   [] Wheeze  [] COPD   [] Asthma Neurologic:  [] Dizziness   [] Seizures   [] History of stroke   [] History of TIA  [] Aphasia   [] Vissual changes   [] Weakness or numbness in arm   [] Weakness or numbness in leg Musculoskeletal:   [] Joint swelling   [] Joint pain   [] Low back pain Hematologic:  [] Easy bruising  [] Easy bleeding   [] Hypercoagulable state   [] Anemic Gastrointestinal:  [] Diarrhea   [] Vomiting  [] Gastroesophageal reflux/heartburn   [] Difficulty swallowing. Genitourinary:  [] Chronic kidney disease   [] Difficult urination  [] Frequent urination   [] Blood in urine Skin:  [] Rashes   [] Ulcers  Psychological:  [] History of anxiety   []  History of major depression.  Physical Examination  Vitals:   06/20/18 1115  BP: 115/86  Pulse: (!) 40  Resp: 14  Weight: 200 lb (90.7 kg)  Height: 5' 8.5" (1.74 m)   Body mass index is 29.97 kg/m. Gen: WD/WN, NAD Head: [place]/AT, No temporalis wasting.  Ear/Nose/Throat: Hearing grossly intact, nares w/o erythema or drainage Eyes: PER, EOMI, sclera nonicteric.  Neck: Supple, no large masses.   Pulmonary:  Good air movement, no audible wheezing bilaterally, no use of accessory muscles.  Cardiac: RRR, no JVD Vascular: Left leg demonstrates 2+ edema with mild to moderate venous stasis changes.  There is some mild thickening and woodiness of the skin at this time. Vessel Right Left  Radial Palpable Palpable  PT Palpable Palpable  DP Palpable Palpable  Gastrointestinal: Non-distended. No guarding/no peritoneal signs.  Musculoskeletal: M/S 5/5 throughout.  No deformity or atrophy.  Neurologic: CN 2-12 intact. Symmetrical.  Speech is fluent. Motor exam as listed above. Psychiatric: Judgment intact, Mood & affect  appropriate for pt's clinical situation. Dermatologic: Venous rashes no ulcers noted.  No changes consistent with cellulitis. Lymph : No lichenification or skin changes of chronic lymphedema.  CBC Lab Results  Component Value Date   WBC 6.1 03/23/2018   HGB 10.8 (L) 03/23/2018   HCT 31.0 (L) 03/23/2018   MCV 89.6 03/23/2018   PLT 233 03/23/2018    BMET    Component Value Date/Time   NA 132 (L) 03/19/2018 0151   NA 136 09/27/2013 0816   K 3.8 03/19/2018 0151   K 3.9 09/27/2013 0816   CL 100 (L) 03/19/2018 0151   CL 105 09/27/2013 0816   CO2 28 03/19/2018 0151   CO2 26 09/27/2013 0816   GLUCOSE 112 (H) 03/19/2018 0151   GLUCOSE 98 09/27/2013 0816   BUN 9 03/19/2018 0151   BUN 15 09/27/2013 0816   CREATININE 0.88 03/19/2018 0151  CREATININE 0.93 09/27/2013 0816   CALCIUM 7.9 (L) 03/19/2018 0151   CALCIUM 9.2 09/27/2013 0816   GFRNONAA >60 03/19/2018 0151   GFRNONAA >60 09/27/2013 0816   GFRAA >60 03/19/2018 0151   GFRAA >60 09/27/2013 0816   CrCl cannot be calculated (Patient's most recent lab result is older than the maximum 21 days allowed.).  COAG Lab Results  Component Value Date   INR 1.15 03/20/2018   INR 1.13 03/18/2018   INR 0.89 03/14/2018    Radiology No results found.   Assessment/Plan 1. 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  IVC filter is present.  The patient is initiated on and will continue anticoagulation   Elevation was stressed, use of a recliner was discussed.  I have reiterated my discussion with the patient regarding DVT and post phlebitic changes such as swelling and why it  causes symptoms such as pain.  The patient will continue to wear graduated compression stockings class 1, (20-30 mmHg), on a daily basis a prescription was given. The patient will  beginning wearing the stockings first thing in the morning and  removing them in the evening. The patient is instructed specifically not to sleep in the stockings.  In addition, behavioral modification including elevation during the day and avoidance of prolonged dependency will be initiated.    With respect to his current functional status, this past month has been much more consistent with my experience and expectations regarding a patient that has had such an extensive DVT.  I am not surprised that he is not doing well trying to stand at work.  Typically standing continuously for more than 15 to 30 minutes at a time will cause him severe pain and if that is repeated over and over again, for example over an 8-hour shift, his leg will become massively swollen even with graduated compression.  If this is done on a week to week month-to-month year-to-year basis it certainly puts his leg in jeopardy.  He will be at a significant risk for repetitive infections and loss of function of his leg.  This will be a lifelong debility.  I commended him for his desire to return to work and was certainly okay with this trial as no permanent damage would be done in such a short time but the results do meet my expectation and at this point I have encouraged him to recontact his attorney and move forward with disability.  The fact that he can stand for such a short period of time and requires such frequent breaks where he is able to elevate his leg which is placing his ankle above heart level and not simply sitting down makes for a very limited job pool.  I have called [employer] and left my cell phone number so that they can contact me.  The patient will continue anticoagulation for now as there have not been any problems or complications at this point.    2. Post-phlebitic syndrome See #1  3. Leg pain, left See #1    Levora Dredge, MD  06/21/2018 1:22 PM

## 2018-06-27 ENCOUNTER — Telehealth (INDEPENDENT_AMBULATORY_CARE_PROVIDER_SITE_OTHER): Payer: Self-pay

## 2018-06-27 NOTE — Telephone Encounter (Signed)
Patient called and asked if his disability letter was ready? He stated that he requested it at his last office visit and that Dr. Gilda Crease said that he would work on it.

## 2018-06-30 ENCOUNTER — Telehealth (INDEPENDENT_AMBULATORY_CARE_PROVIDER_SITE_OTHER): Payer: Self-pay | Admitting: Vascular Surgery

## 2018-06-30 ENCOUNTER — Telehealth (INDEPENDENT_AMBULATORY_CARE_PROVIDER_SITE_OTHER): Payer: Self-pay

## 2018-06-30 NOTE — Telephone Encounter (Signed)
ERROR

## 2018-07-04 ENCOUNTER — Telehealth (INDEPENDENT_AMBULATORY_CARE_PROVIDER_SITE_OTHER): Payer: Self-pay

## 2018-07-04 NOTE — Telephone Encounter (Signed)
Patient called informing that he is in the process of looking into another lawyer and was asking if the disability letter could be ready before Friday.I spoke with Schnier and inform me to give the patient the last office note to give to his lawyer.If the lawyer do request for the letter he will it write for the patient.The patient will be coming by the office to pick up the note.

## 2018-08-26 IMAGING — US US EXTREM LOW VENOUS*L*
1 series · 13 of 24 positions shown · non-contrast
Comparison: 02/17/2017

CLINICAL DATA: Left leg pain and swelling.



[Series 1: us extrem low venous*left* · 0.08mm/px · 13 of 34 slices shown]
[im 1/34]
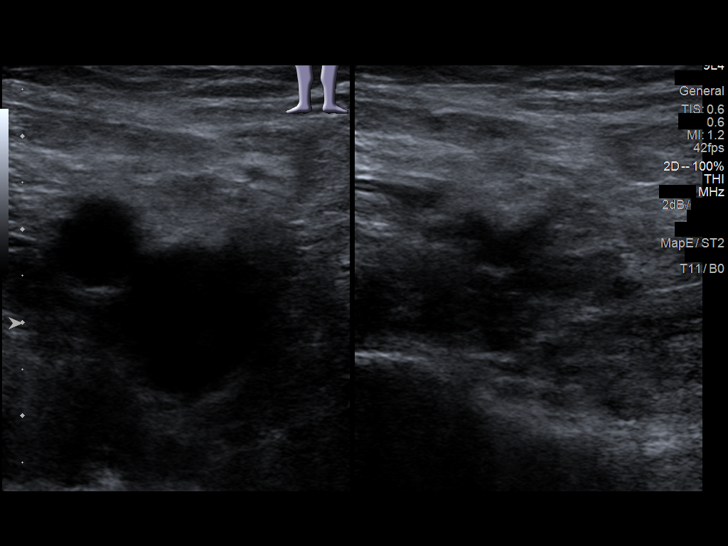
[im 3/34]
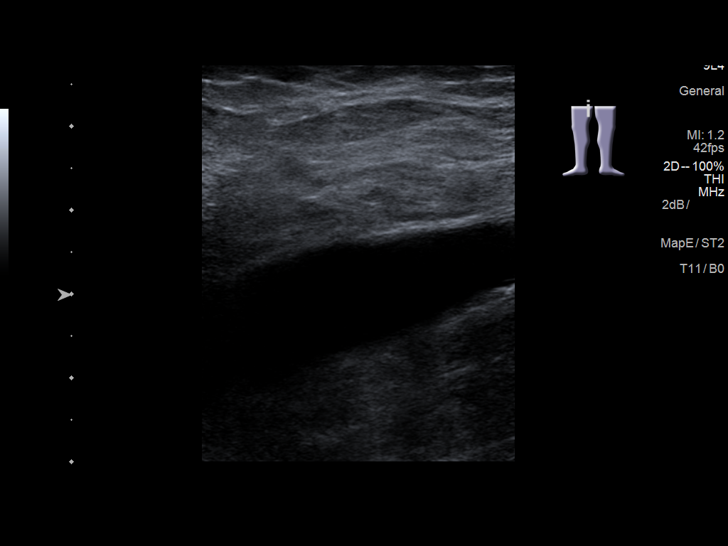
[im 6/34]
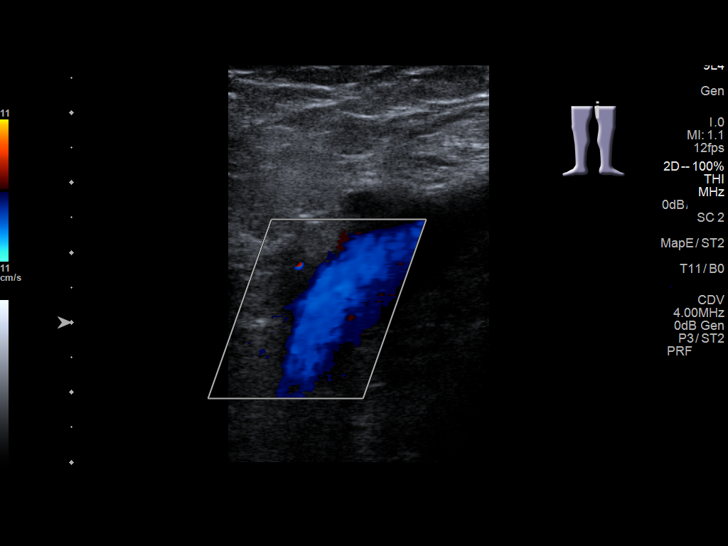
[im 9/34]
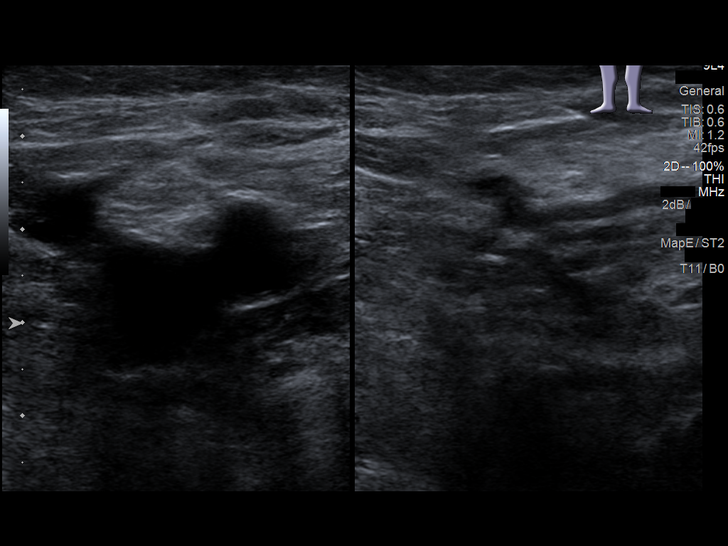
[im 12/34]
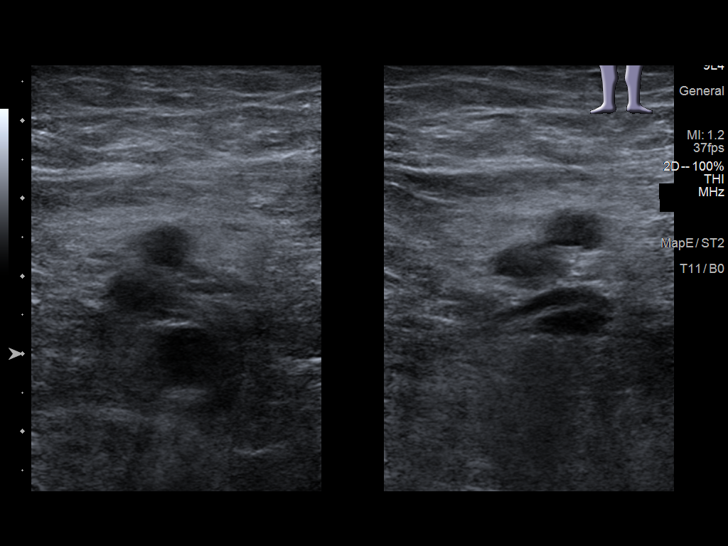
[im 15/34]
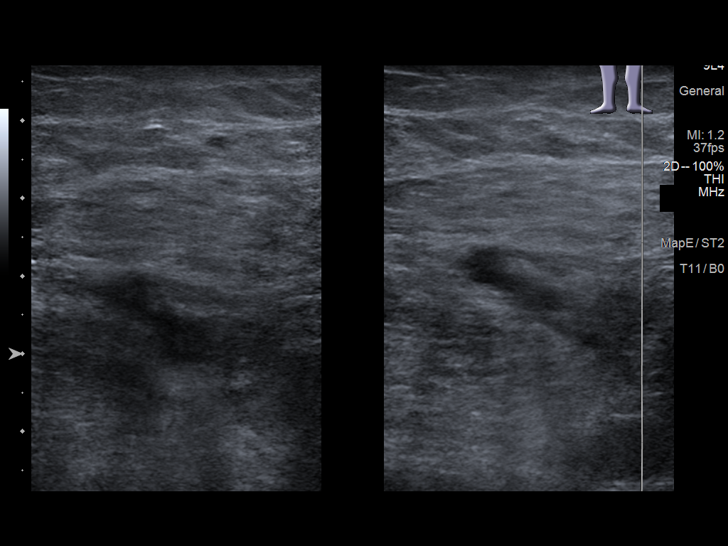
[im 18/34]
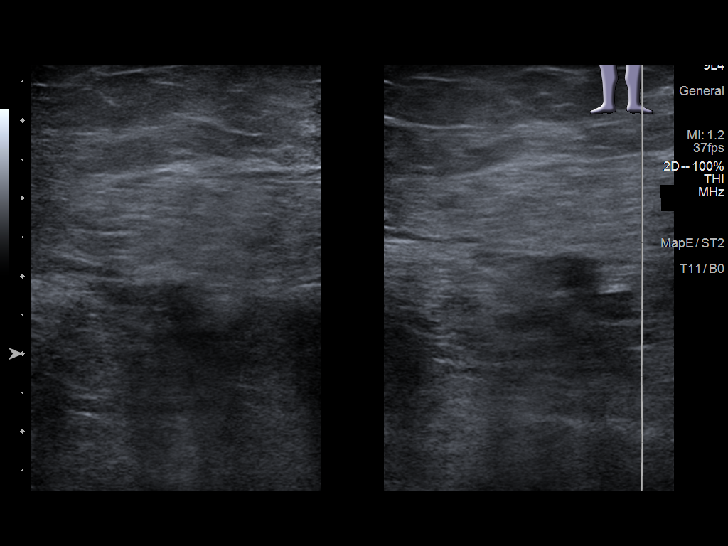
[im 19/34]
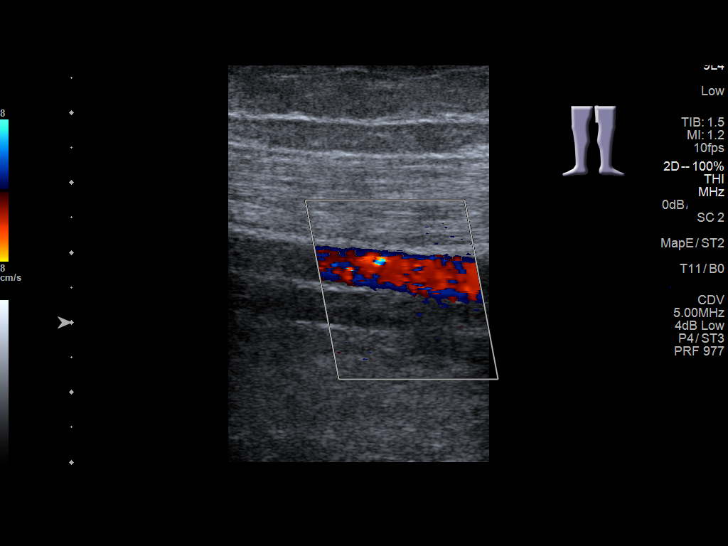
[im 22/34]
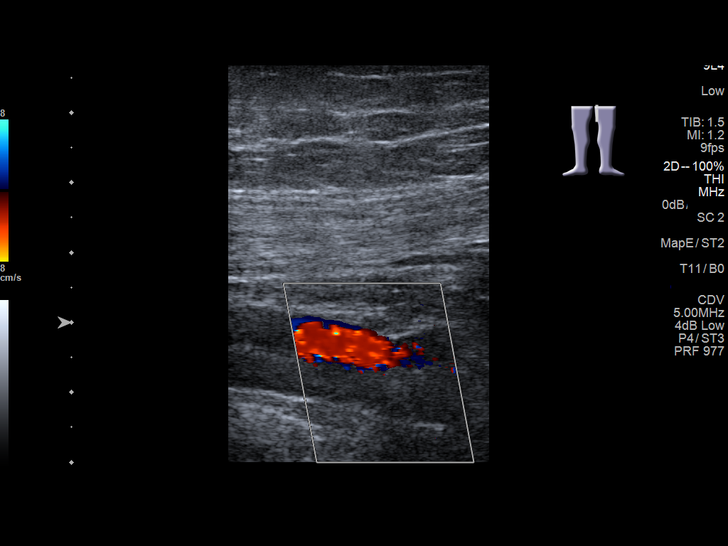
[im 25/34]
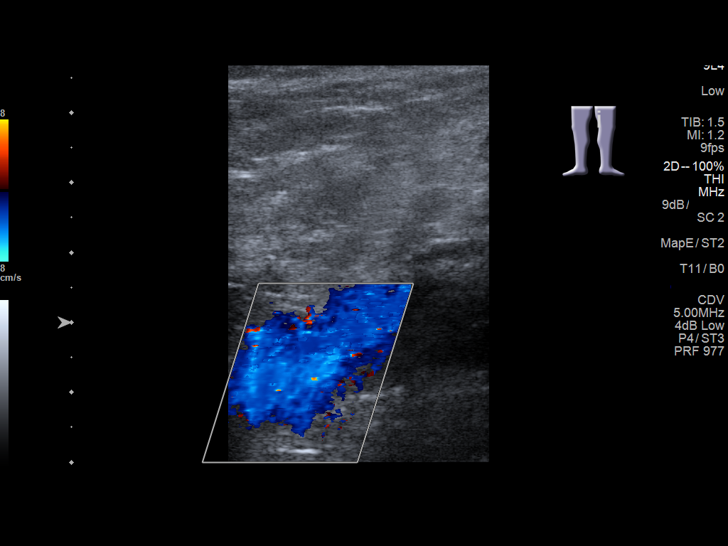
[im 28/34]
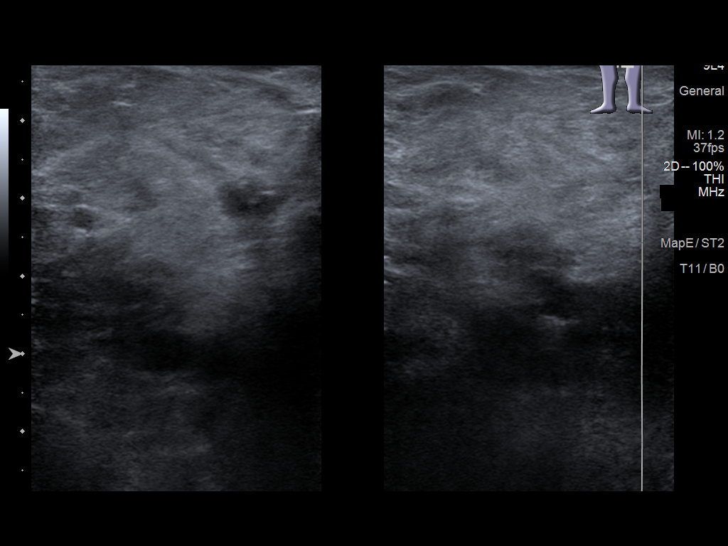
[im 31/34]
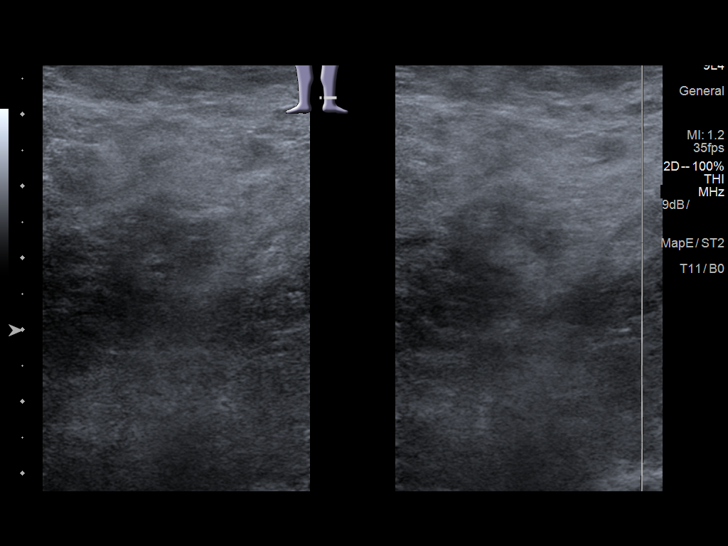
[im 34/34]
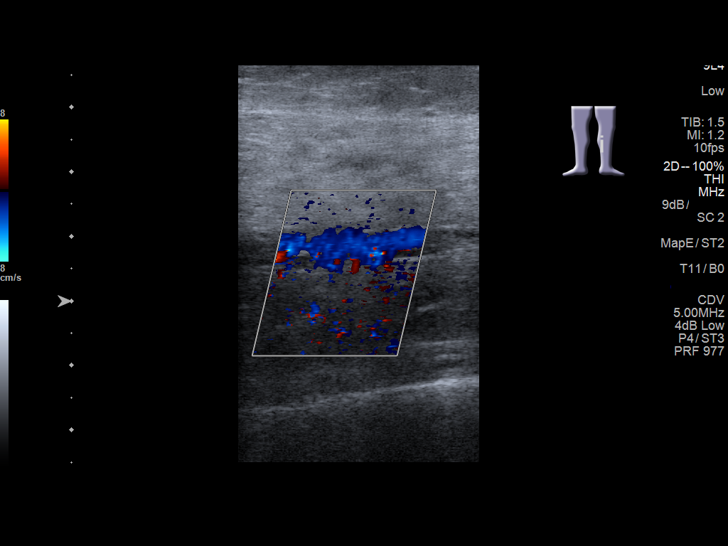

[13 of 24 positions shown; findings below may reference images not displayed]

FINDINGS: Contralateral Common Femoral Vein: Respiratory phasicity is normal
and symmetric with the symptomatic side. No evidence of thrombus.
Normal compressibility.

Common Femoral Vein: No evidence of thrombus. Normal
compressibility, respiratory phasicity and response to augmentation.

Saphenofemoral Junction: Stringy echogenic material in the lumen
suggests chronic thrombus.

Profunda Femoral Vein: No evidence of thrombus..

Femoral Vein: Lumen filled with echogenic material and no color
Doppler signal evident. Muted augmentation and incomplete
compressibility.

Popliteal Vein: Poorly visualized. Incomplete compressibility with
poor color Doppler signal.

Calf Veins: Poorly visualized with poor color signal.
Compressibility not well evaluated.

Other Findings:  None.
IMPRESSION: Thrombus identified in the femoral vein with extension into the
upper portion of the popliteal vein. The patient had thrombus in
this vessel previously and no definite recanalization at this time.
Imaging features may be related to residual or recurrent thrombus.

## 2018-11-07 ENCOUNTER — Encounter (INDEPENDENT_AMBULATORY_CARE_PROVIDER_SITE_OTHER): Payer: Self-pay | Admitting: Vascular Surgery

## 2018-11-07 ENCOUNTER — Ambulatory Visit (INDEPENDENT_AMBULATORY_CARE_PROVIDER_SITE_OTHER): Payer: Self-pay | Admitting: Vascular Surgery

## 2018-11-07 VITALS — BP 125/83 | HR 53 | Ht 68.0 in | Wt 211.0 lb

## 2018-11-07 DIAGNOSIS — F17210 Nicotine dependence, cigarettes, uncomplicated: Secondary | ICD-10-CM

## 2018-11-07 DIAGNOSIS — I825Y2 Chronic embolism and thrombosis of unspecified deep veins of left proximal lower extremity: Secondary | ICD-10-CM

## 2018-11-07 DIAGNOSIS — I4892 Unspecified atrial flutter: Secondary | ICD-10-CM

## 2018-11-07 DIAGNOSIS — I890 Lymphedema, not elsewhere classified: Secondary | ICD-10-CM | POA: Insufficient documentation

## 2018-11-07 NOTE — Progress Notes (Signed)
MRN : [id number]  Henry Dunn is a 42 y.o. (Jan 07, 1976) male who presents with chief complaint of No chief complaint on file. Marland Kitchen  History of Present Illness:   The patient presents to the office for evaluation of DVT. DVT was identified at [facility] by Duplex ultrasound. The initial symptoms were pain and swelling in the lower extremity. He has a history of left leg DVT remotes treated at another institution at which time an IVC filter was placed.  On April 19,2019 he underwent: 1. Ultrasound guidance for vascular access to the bilateral Superficialfemoral veins 2. Catheter placement into the inferior vena cava bilateralfemoral vein approach 3. Inferior venacavogram 4. Bilateral lower extremity venogram 5. Angioplasty and stent placement with the Renovo stent bilateral common iliac veins 6. Infusion thrombolysis with 16 mg of TPA 7. Mechanical thrombectomy of the bilateralcommon femoral veins,bilateral external iliac veins, bilateral common iliac veins and the infrarenal portion of the inferior vena cavausing the penumbra cat 8 device  The patient notes the left leg continues to be tight with dependency and swells quite a bite every day. Symptoms are much better with elevation. The patient notes lessedema in the morning which steadily worsens throughout the day.  Overall he feels he is better than the last visit  The patient has been usingknee highcompression therapy at this point.  No SOB or pleuritic chest pains. No cough or hemoptysis.  No blood per rectum or blood in any sputum. No excessive bruising per the patient.   No outpatient medications have been marked as taking for the 11/07/18 encounter (Appointment) with Gilda Crease, Latina Craver, MD.    Past Medical History:  Diagnosis Date  . A-fib (HCC)   . DVT (deep venous thrombosis) (HCC)    a. In setting of dislocated hip, status post IVC filter, previously on Coumadin, noncompliant over the past 12  months  . Hx of blood clots    a. Patient denies any family history of clotting disorder, patient denies prior hypercoagulable workup  . PE (pulmonary thromboembolism) (HCC)     Past Surgical History:  Procedure Laterality Date  . INSERTION OF VENA CAVA FILTER    . PERIPHERAL VASCULAR THROMBECTOMY Right 03/18/2018   Procedure: PERIPHERAL VASCULAR THROMBECTOMY;  Surgeon: Renford Dills, MD;  Location: [facility] INVASIVE CV LAB;  Service: Cardiovascular;  Laterality: Right;    Social History Social History   Tobacco Use  . Smoking status: Current Every Day Smoker    Packs/day: 0.50    Types: Cigarettes  . Smokeless tobacco: Never Used  Substance Use Topics  . Alcohol use: Yes    Alcohol/week: 12.0 standard drinks    Types: 12 Cans of beer per week  . Drug use: Not Currently    Family History Family History  Problem Relation Age of Onset  . Diabetes Neg Hx   . Hypertension Neg Hx     No Known Allergies   REVIEW OF SYSTEMS (Negative unless checked)  Constitutional: [] Weight loss  [] Fever  [] Chills Cardiac: [] Chest pain   [] Chest pressure   [] Palpitations   [] Shortness of breath when laying flat   [] Shortness of breath with exertion. Vascular:  [] Pain in legs with walking   [x] Pain in legs at rest  [x] History of DVT   [] Phlebitis   [x] Swelling in legs   [] Varicose veins   [] Non-healing ulcers Pulmonary:   [] Uses home oxygen   [] Productive cough   [] Hemoptysis   [] Wheeze  [] COPD   [] Asthma Neurologic:  [] Dizziness   []   Seizures   [] History of stroke   [] History of TIA  [] Aphasia   [] Vissual changes   [] Weakness or numbness in arm   [] Weakness or numbness in leg Musculoskeletal:   [] Joint swelling   [] Joint pain   [] Low back pain Hematologic:  [] Easy bruising  [] Easy bleeding   [] Hypercoagulable state   [] Anemic Gastrointestinal:  [] Diarrhea   [] Vomiting  [] Gastroesophageal reflux/heartburn   [] Difficulty swallowing. Genitourinary:  [] Chronic kidney disease   [] Difficult  urination  [] Frequent urination   [] Blood in urine Skin:  [] Rashes   [] Ulcers  Psychological:  [] History of anxiety   []  History of major depression.  Physical Examination  There were no vitals filed for this visit. There is no height or weight on file to calculate BMI. Gen: WD/WN, NAD Head: [place]/AT, No temporalis wasting.  Ear/Nose/Throat: Hearing grossly intact, nares w/o erythema or drainage Eyes: PER, EOMI, sclera nonicteric.  Neck: Supple, no large masses.   Pulmonary:  Good air movement, no audible wheezing bilaterally, no use of accessory muscles.  Cardiac: RRR, no JVD Vascular: scattered varicosities present bilaterally.  Mild to moderate venous stasis changes to the legs bilaterally, left greater than right.  3-4+ soft pitting edema left leg Vessel Right Left  Radial Palpable Palpable  PT Palpable Palpable  DP Palpable Palpable  Gastrointestinal: Non-distended. No guarding/no peritoneal signs.  Musculoskeletal: M/S 5/5 throughout.  No deformity or atrophy.  Neurologic: CN 2-12 intact. Symmetrical.  Speech is fluent. Motor exam as listed above. Psychiatric: Judgment intact, Mood & affect appropriate for pt's clinical situation. Dermatologic: venous rashes no ulcers noted.  No changes consistent with cellulitis. Lymph : No lichenification or skin changes of chronic lymphedema.  CBC Lab Results  Component Value Date   WBC 6.1 03/23/2018   HGB 10.8 (L) 03/23/2018   HCT 31.0 (L) 03/23/2018   MCV 89.6 03/23/2018   PLT 233 03/23/2018    BMET    Component Value Date/Time   NA 132 (L) 03/19/2018 0151   NA 136 09/27/2013 0816   K 3.8 03/19/2018 0151   K 3.9 09/27/2013 0816   CL 100 (L) 03/19/2018 0151   CL 105 09/27/2013 0816   CO2 28 03/19/2018 0151   CO2 26 09/27/2013 0816   GLUCOSE 112 (H) 03/19/2018 0151   GLUCOSE 98 09/27/2013 0816   BUN 9 03/19/2018 0151   BUN 15 09/27/2013 0816   CREATININE 0.88 03/19/2018 0151   CREATININE 0.93 09/27/2013 0816   CALCIUM 7.9  (L) 03/19/2018 0151   CALCIUM 9.2 09/27/2013 0816   GFRNONAA >60 03/19/2018 0151   GFRNONAA >60 09/27/2013 0816   GFRAA >60 03/19/2018 0151   GFRAA >60 09/27/2013 0816   CrCl cannot be calculated (Patient's most recent lab result is older than the maximum 21 days allowed.).  COAG Lab Results  Component Value Date   INR 1.15 03/20/2018   INR 1.13 03/18/2018   INR 0.89 03/14/2018    Radiology No results found.   Assessment/Plan 1. 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    I have reviewed my previous discussion with the patient regarding swelling and why it causes symptoms.  Patient will continue wearing graduated compression stockings class 1 (20-30 mmHg) on a daily basis. The patient will  beginning wearing the stockings first thing in the morning and removing them in the evening. The patient is instructed specifically not to sleep in the  stockings.    In addition, behavioral modification including several periods of elevation of the lower extremities during the day will be continued.  This was reviewed with the patient during the initial visit.  The patient will also continue routine exercise, especially walking on a daily basis as was discussed during the initial visit.    Despite conservative treatments including graduated compression therapy class 1 and behavioral modification including exercise and elevation the patient  has not obtained adequate control of the lymphedema.  The patient still has stage 3 lymphedema and therefore, I believe that a lymph pump should be added to improve the control of the patient's lymphedema.  Additionally, a lymph pump is warranted because it will reduce the risk of cellulitis and ulceration in the future.  Patient should follow-up in six months    2. Lymphedema Recommend:  No surgery or intervention at this point in time.    I have  reviewed my previous discussion with the patient regarding swelling and why it causes symptoms.  Patient will continue wearing graduated compression stockings class 1 (20-30 mmHg) on a daily basis. The patient will  beginning wearing the stockings first thing in the morning and removing them in the evening. The patient is instructed specifically not to sleep in the stockings.    In addition, behavioral modification including several periods of elevation of the lower extremities during the day will be continued.  This was reviewed with the patient during the initial visit.  The patient will also continue routine exercise, especially walking on a daily basis as was discussed during the initial visit.    Despite conservative treatments including graduated compression therapy class 1 and behavioral modification including exercise and elevation the patient  has not obtained adequate control of the lymphedema.  The patient still has stage 3 lymphedema and therefore, I believe that a lymph pump should be added to improve the control of the patient's lymphedema.  Additionally, a lymph pump is warranted because it will reduce the risk of cellulitis and ulceration in the future.  Patient should follow-up in six months    3. New onset atrial flutter (HCC) Continue antiarrhythmia medications as already ordered, these medications have been reviewed and there are no changes at this time.  Continue anticoagulation as ordered by Cardiology Service    Levora Dredge, MD  11/07/2018 9:17 AM

## 2019-01-06 ENCOUNTER — Other Ambulatory Visit: Payer: Self-pay

## 2019-01-06 ENCOUNTER — Encounter: Payer: Self-pay | Admitting: Emergency Medicine

## 2019-01-06 ENCOUNTER — Emergency Department: Payer: Self-pay

## 2019-01-06 ENCOUNTER — Emergency Department
Admission: EM | Admit: 2019-01-06 | Discharge: 2019-01-06 | Disposition: A | Discharge status: Home / Self Care | Payer: Self-pay | Attending: Emergency Medicine | Admitting: Emergency Medicine

## 2019-01-06 DIAGNOSIS — R002 Palpitations: Secondary | ICD-10-CM | POA: Insufficient documentation

## 2019-01-06 DIAGNOSIS — F17210 Nicotine dependence, cigarettes, uncomplicated: Secondary | ICD-10-CM | POA: Insufficient documentation

## 2019-01-06 DIAGNOSIS — R0602 Shortness of breath: Secondary | ICD-10-CM | POA: Insufficient documentation

## 2019-01-06 DIAGNOSIS — Z7901 Long term (current) use of anticoagulants: Secondary | ICD-10-CM | POA: Insufficient documentation

## 2019-01-06 DIAGNOSIS — I4891 Unspecified atrial fibrillation: Secondary | ICD-10-CM | POA: Insufficient documentation

## 2019-01-06 DIAGNOSIS — R079 Chest pain, unspecified: Secondary | ICD-10-CM | POA: Insufficient documentation

## 2019-01-06 LAB — COMPREHENSIVE METABOLIC PANEL
ALT: 20 U/L (ref 0–44)
AST: 21 U/L (ref 15–41)
Albumin: 3.9 g/dL (ref 3.5–5.0)
Alkaline Phosphatase: 53 U/L (ref 38–126)
Anion gap: 9 (ref 5–15)
BUN: 11 mg/dL (ref 6–20)
CO2: 22 mmol/L (ref 22–32)
Calcium: 8.5 mg/dL — ABNORMAL LOW (ref 8.9–10.3)
Chloride: 107 mmol/L (ref 98–111)
Creatinine, Ser: 1.05 mg/dL (ref 0.61–1.24)
GFR calc Af Amer: >60 mL/min (ref >60)
GFR calc non Af Amer: >60 mL/min (ref >60)
Glucose, Bld: 105 mg/dL — ABNORMAL HIGH (ref 70–99)
Potassium: 3.9 mmol/L (ref 3.5–5.1)
Sodium: 138 mmol/L (ref 135–145)
Total Bilirubin: 0.7 mg/dL (ref 0.3–1.2)
Total Protein: 7.1 g/dL (ref 6.5–8.1)

## 2019-01-06 LAB — CBC WITH DIFFERENTIAL/PLATELET
Abs Immature Granulocytes: 0.03 10*3/uL (ref 0.00–0.07)
Basophils Absolute: 0 10*3/uL (ref 0.0–0.1)
Basophils Relative: 0 %
Eosinophils Absolute: 0.3 10*3/uL (ref 0.0–0.5)
Eosinophils Relative: 3 %
HCT: 46.5 % (ref 39.0–52.0)
Hemoglobin: 15.4 g/dL (ref 13.0–17.0)
Immature Granulocytes: 0 %
Lymphocytes Relative: 27 %
Lymphs Abs: 3.3 10*3/uL (ref 0.7–4.0)
MCH: 30.6 pg (ref 26.0–34.0)
MCHC: 33.1 g/dL (ref 30.0–36.0)
MCV: 92.4 fL (ref 80.0–100.0)
Monocytes Absolute: 0.7 10*3/uL (ref 0.1–1.0)
Monocytes Relative: 6 %
Neutro Abs: 7.9 10*3/uL — ABNORMAL HIGH (ref 1.7–7.7)
Neutrophils Relative %: 64 %
Platelets: 196 10*3/uL (ref 150–400)
RBC: 5.03 MIL/uL (ref 4.22–5.81)
RDW: 13.1 % (ref 11.5–15.5)
WBC: 12.3 10*3/uL — ABNORMAL HIGH (ref 4.0–10.5)
nRBC: 0 % (ref 0.0–0.2)

## 2019-01-06 LAB — TROPONIN I
Troponin I: <0.03 ng/mL (ref <0.03)
Troponin I: <0.03 ng/mL (ref <0.03)

## 2019-01-06 LAB — ETHANOL: Alcohol, Ethyl (B): <10 mg/dL (ref <10)

## 2019-01-06 LAB — BRAIN NATRIURETIC PEPTIDE: B Natriuretic Peptide: 814 pg/mL — ABNORMAL HIGH (ref 0.0–100.0)

## 2019-01-06 MED ORDER — FUROSEMIDE 10 MG/ML IJ SOLN
20.0000 mg | Freq: Once | INTRAMUSCULAR | Status: AC
  Administered 2019-01-06: 20 mg via INTRAVENOUS
  Filled 2019-01-06: qty 4

## 2019-01-06 MED ORDER — DILTIAZEM HCL 25 MG/5ML IV SOLN: 15.0000 mg | Freq: Once | INTRAVENOUS | Status: DC

## 2019-01-06 MED ORDER — DILTIAZEM HCL 25 MG/5ML IV SOLN
10.0000 mg | Freq: Once | INTRAVENOUS | Status: AC
  Administered 2019-01-06: 10 mg via INTRAVENOUS
  Filled 2019-01-06: qty 5

## 2019-01-06 MED ORDER — METOPROLOL SUCCINATE ER 50 MG PO TB24
25.0000 mg | ORAL_TABLET | Freq: Once | ORAL | Status: AC
Start: 2019-01-06 — End: 2019-01-06
  Administered 2019-01-06: 25 mg via ORAL
  Filled 2019-01-06: qty 1

## 2019-01-06 MED ORDER — DILTIAZEM HCL 25 MG/5ML IV SOLN
15.0000 mg | Freq: Once | INTRAVENOUS | Status: AC
  Administered 2019-01-06: 15 mg via INTRAVENOUS

## 2019-01-06 MED ORDER — METOPROLOL SUCCINATE ER 25 MG PO TB24: 25.0000 mg | ORAL_TABLET | Freq: Every day | ORAL | 1 refills | Status: DC

## 2019-01-06 NOTE — ED Triage Notes (Addendum)
Patient ambulatory to triage with steady gait, without difficulty, appears uncomfortable; pt reports SHOB since 8pm with upper CP; st hx afib and PE; pt taken to room 4 by EDT Mayra to be placed on card monitor for further eval

## 2019-01-06 NOTE — ED Notes (Signed)
Pt taken off O2 with cont. pulse ox monitoring.

## 2019-01-06 NOTE — ED Notes (Signed)
ED Provider at bedside. 

## 2019-01-06 NOTE — ED Provider Notes (Signed)
[facility] Emergency Department Provider Note   ____________________________________________   First MD Initiated Contact with Patient 01/06/19 [phone]     (approximate)  I have reviewed the triage vital signs and the nursing notes.   HISTORY  Chief Complaint Shortness of Breath    HPI Henry Dunn is a 43 y.o. male who presents to the ED from home with a chief complaint of chest pain and palpitations.  Patient has a history of atrial fibrillation on Xarelto, history of PE status post IVC filter.  On metoprolol 20 mg extended release daily.  Reports palpitations and shortness of breath since 7 PM, worsening prior to arrival.  Denies recent fever, chills, cough, congestion, abdominal pain, nausea, vomiting.  Denies recent EtOH other than 2 days ago.  Denies recent travel or trauma.    Past Medical History:  Diagnosis Date  . A-fib (HCC)   . DVT (deep venous thrombosis) (HCC)    a. In setting of dislocated hip, status post IVC filter, previously on Coumadin, noncompliant over the past 12 months  . Hx of blood clots    a. Patient denies any family history of clotting disorder, patient denies prior hypercoagulable workup  . PE (pulmonary thromboembolism) [facility](HCC)     Patient Active Problem List   Diagnosis Date Noted  . Lymphedema 11/07/2018  . 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 Leg pain, left 06/21/2018  . Cellulitis and abscess of leg   . New onset atrial flutter (HCC) 02/18/2017  . Chronic deep vein thrombosis (DVT) (HCC) 02/18/2017  . Tobacco abuse 02/18/2017  . DVT (deep venous thrombosis) (HCC) 02/18/2017  . Shortness of breath 02/17/2017    Past Surgical History:  Procedure Laterality Date  . INSERTION OF VENA CAVA FILTER    . PERIPHERAL VASCULAR THROMBECTOMY Right 03/18/2018   Procedure: PERIPHERAL VASCULAR THROMBECTOMY;  Surgeon: Renford DillsSchnier, Gregory G, MD;  Location: [facility] INVASIVE CV LAB;  Service: Cardiovascular;  Laterality: Right;     Prior to Admission medications   Medication Sig Start Date End Date Taking? Authorizing Provider  acetaminophen (TYLENOL) 325 MG tablet Take 2 tablets (650 mg total) by mouth every 6 (six) hours as needed for mild pain (or Fever >/= 101). Patient not taking: Reported on 11/07/2018 03/19/18   Bertram DenverEsco, Miechia A, MD  aspirin EC 81 MG EC tablet Take 1 tablet (81 mg total) by mouth daily. 03/20/18 04/19/18  Esco, Morton PetersMiechia A, MD  metoprolol succinate (TOPROL-XL) 25 MG 24 hr tablet Take 25 mg by mouth daily.    [provider]  metoprolol succinate (TOPROL-XL) 25 MG 24 hr tablet Take 1 tablet (25 mg total) by mouth daily. 03/23/18   Schnier, Latina CraverGregory G, MD  oxyCODONE-acetaminophen (PERCOCET) 5-325 MG tablet Take 1-2 tablets by mouth every 6 (six) hours as needed for severe pain. Patient not taking: Reported on 05/05/2018 03/23/18   Schnier, Latina CraverGregory G, MD  Rivaroxaban (XARELTO) 15 MG TABS tablet Take 1 tablet (15 mg total) by mouth 2 (two) times daily. Patient not taking: Reported on 05/05/2018 03/23/18   Schnier, Latina CraverGregory G, MD  rivaroxaban (XARELTO) 20 MG TABS tablet Take 1 tablet (20 mg total) by mouth daily with supper. This is to begin after he has finished the 15 mg bid Prescription  For 21 days Patient not taking: Reported on 11/07/2018 04/12/18   Schnier, Latina CraverGregory G, MD  XARELTO 20 MG TABS tablet TAKE 1 TABLET BY MOUTH ONCE DAILY 05/09/18   Antonieta IbaGollan, Timothy J, MD    Allergies Patient  has no known allergies.  Family History  Problem Relation Age of Onset  . Diabetes Neg Hx   . Hypertension Neg Hx     Social History Social History   Tobacco Use  . Smoking status: Current Every Day Smoker    Packs/day: 0.50    Types: Cigarettes  . Smokeless tobacco: Never Used  Substance Use Topics  . Alcohol use: Yes    Alcohol/week: 12.0 standard drinks    Types: 12 Cans of beer per week  . Drug use: Not Currently    Review of Systems  Constitutional: No fever/chills Eyes: No visual changes. ENT:  No sore throat. Cardiovascular: Positive for palpitations and chest pain. Respiratory: Positive for shortness of breath. Gastrointestinal: No abdominal pain.  No nausea, no vomiting.  No diarrhea.  No constipation. Genitourinary: Negative for dysuria. Musculoskeletal: Negative for back pain. Skin: Negative for rash. Neurological: Negative for headaches, focal weakness or numbness.   ____________________________________________   PHYSICAL EXAM:  VITAL SIGNS: ED Triage Vitals  Enc Vitals Group     BP 01/06/19 0455 (!) 132/95     Pulse Rate 01/06/19 0455 96     Resp 01/06/19 0455 (!) 24     Temp 01/06/19 0455 98.1 F (36.7 C)     Temp Source 01/06/19 0455 Oral     SpO2 01/06/19 0455 99 %     Weight 01/06/19 0454 210 lb (95.3 kg)     Height 01/06/19 0454 5\' 8"  (1.727 m)     Head Circumference --      Peak Flow --      Pain Score 01/06/19 0453 0     Pain Loc --      Pain Edu? --      Excl. in GC? --     Constitutional: Alert and oriented. Well appearing and in moderate acute distress. Eyes: Conjunctivae are normal. PERRL. EOMI. Head: Atraumatic. Nose: No congestion/rhinnorhea. Mouth/Throat: Mucous membranes are moist.  Oropharynx non-erythematous. Neck: No stridor.   Cardiovascular: Tachycardic rate, irregular rhythm. Grossly normal heart sounds.  Good peripheral circulation. Respiratory: Normal respiratory effort.  No retractions. Lungs CTAB. Gastrointestinal: Soft and nontender. No distention. No abdominal bruits. No CVA tenderness. Musculoskeletal: No lower extremity tenderness nor edema.  No joint effusions. Neurologic:  Normal speech and language. No gross focal neurologic deficits are appreciated. No gait instability. Skin:  Skin is warm, dry and intact. No rash noted. Psychiatric: Mood and affect are normal. Speech and behavior are normal.  ____________________________________________   LABS (all labs ordered are listed, but only abnormal results are  displayed)  Labs Reviewed  CBC WITH DIFFERENTIAL/PLATELET - Abnormal; Notable for the following components:      Result Value   WBC 12.3 (*)    Neutro Abs 7.9 (*)    All other components within normal limits  COMPREHENSIVE METABOLIC PANEL - Abnormal; Notable for the following components:   Glucose, Bld 105 (*)    Calcium 8.5 (*)    All other components within normal limits  BRAIN NATRIURETIC PEPTIDE - Abnormal; Notable for the following components:   B Natriuretic Peptide 814.0 (*)    All other components within normal limits  TROPONIN I  ETHANOL  TROPONIN I   ____________________________________________  EKG  ED ECG REPORT I, Kwane Rohl J, the attending physician, personally viewed and interpreted this ECG.   Date: 01/06/2019  EKG Time: 0503  Rate: 141  Rhythm: atrial fibrillation, rate 141  Axis: Normal  Intervals:none  ST&T Change: Nonspecific  ____________________________________________  RADIOLOGY  ED MD interpretation: Pulmonary vascular congestion  Official radiology report(s): Dg Chest Portable 1 View  Result Date: 01/06/2019 CLINICAL DATA:  Shortness of breath EXAM: PORTABLE CHEST 1 VIEW COMPARISON:  03/14/2018 chest CT FINDINGS: Cardiomegaly. There are a few Kerley lines at the right base. No pleural effusion or air bronchogram. No pneumothorax. IMPRESSION: Cardiomegaly and a few Kerley lines as seen with interstitial edema. Electronically Signed   By: Marnee Spring M.D.   On: 01/06/2019 05:22    ____________________________________________   PROCEDURES  Procedure(s) performed: None  Procedures  Critical Care performed: Yes, see critical care note(s)   CRITICAL CARE Performed by: Irean Hong   Total critical care time: 45 minutes  Critical care time was exclusive of separately billable procedures and treating other patients.  Critical care was necessary to treat or prevent imminent or life-threatening deterioration.  Critical care was time  spent personally by me on the following activities: development of treatment plan with patient and/or surrogate as well as nursing, discussions with consultants, evaluation of patient's response to treatment, examination of patient, obtaining history from patient or surrogate, ordering and performing treatments and interventions, ordering and review of laboratory studies, ordering and review of radiographic studies, pulse oximetry and re-evaluation of patient's condition.  ____________________________________________   INITIAL IMPRESSION / ASSESSMENT AND PLAN / ED COURSE  As part of my medical decision making, I reviewed the following data within the electronic MEDICAL RECORD NUMBER Nursing notes reviewed and incorporated, Labs reviewed, EKG interpreted, Old chart reviewed, Radiograph reviewed, Discussed with admitting physician and Notes from prior ED visits    43 year old male with history of A. fib, PE, on Xarelto who presents with several hours of palpitations and chest pain. Differential diagnosis includes, but is not limited to, ACS, aortic dissection, pulmonary embolism, cardiac tamponade, pneumothorax, pneumonia, pericarditis, myocarditis, GI-related causes including esophagitis/gastritis, and musculoskeletal chest wall pain.   Will give IV Cardizem bolus.  Obtain screening lab work including EtOH.  Consider Cardizem drip if bolus does not control heart rate.   Clinical Course as of Jan 06 703  Fri Jan 06, 2019  0603 Hemodynamically stable; heart rate back up to 124.  Will bolus 15 mg IV Cardizem.   [JS]  0608 Patient now tells me he has not been taken his metoprolol in a month.   [JS]  0630 Heart rate 80s to 115.  Patient tells me he has been taking Xarelto daily because he gets it free from [employer].  Ran out of his metoprolol 1 month ago.  Plan to repeat timed troponin at 8 AM.  Will administer oral metoprolol now.  If patient's heart rate is under control and repeat troponin is  negative, anticipate patient may be able to be discharged home.  Care will be transferred to Dr. Cyril Loosen.   [JS]    Clinical Course User Index [JS] Irean Hong, MD     ____________________________________________   FINAL CLINICAL IMPRESSION(S) / ED DIAGNOSES  Final diagnoses:  Chest pain, unspecified type  Atrial fibrillation with rapid ventricular response [facility])     ED Discharge Orders    None       Note:  This document was prepared using Dragon voice recognition software and may include unintentional dictation errors.    Irean Hong, MD 01/06/19 [phone]

## 2019-01-06 NOTE — ED Notes (Signed)
Patient placed on 2L [place] per MD [facility] request

## 2019-01-06 NOTE — ED Notes (Signed)
Pt states he has been up walking to bathroom without any shortness of breath. O2 sat has remained stable since removal of n/c.

## 2019-01-06 NOTE — ED Provider Notes (Signed)
Asked to follow-up on second troponin and reevaluate.  Patient reports he is feeling quite well, no shortness of breath with ambulation, heart rate stable, has cardiology follow-up.  Discussed need for close follow-up and return precautions.  Refilled Geraldine Solar, MD 01/06/19 [phone]

## 2019-01-06 NOTE — ED Notes (Signed)

## 2019-01-13 ENCOUNTER — Encounter: Payer: Self-pay | Admitting: Emergency Medicine

## 2019-01-13 ENCOUNTER — Emergency Department
Admission: EM | Admit: 2019-01-13 | Discharge: 2019-01-13 | Disposition: A | Discharge status: Home / Self Care | Payer: Self-pay | Attending: Emergency Medicine | Admitting: Emergency Medicine

## 2019-01-13 ENCOUNTER — Emergency Department: Payer: Self-pay

## 2019-01-13 ENCOUNTER — Other Ambulatory Visit: Payer: Self-pay

## 2019-01-13 DIAGNOSIS — F17210 Nicotine dependence, cigarettes, uncomplicated: Secondary | ICD-10-CM | POA: Insufficient documentation

## 2019-01-13 DIAGNOSIS — Z7982 Long term (current) use of aspirin: Secondary | ICD-10-CM | POA: Insufficient documentation

## 2019-01-13 DIAGNOSIS — Z79899 Other long term (current) drug therapy: Secondary | ICD-10-CM | POA: Insufficient documentation

## 2019-01-13 DIAGNOSIS — R0602 Shortness of breath: Secondary | ICD-10-CM | POA: Insufficient documentation

## 2019-01-13 DIAGNOSIS — Z86718 Personal history of other venous thrombosis and embolism: Secondary | ICD-10-CM | POA: Insufficient documentation

## 2019-01-13 DIAGNOSIS — I4891 Unspecified atrial fibrillation: Secondary | ICD-10-CM | POA: Insufficient documentation

## 2019-01-13 LAB — COMPREHENSIVE METABOLIC PANEL
ALT: 26 U/L (ref 0–44)
AST: 26 U/L (ref 15–41)
Albumin: 3.7 g/dL (ref 3.5–5.0)
Alkaline Phosphatase: 47 U/L (ref 38–126)
Anion gap: 7 (ref 5–15)
BUN: 15 mg/dL (ref 6–20)
CO2: 21 mmol/L — ABNORMAL LOW (ref 22–32)
Calcium: 8.9 mg/dL (ref 8.9–10.3)
Chloride: 111 mmol/L (ref 98–111)
Creatinine, Ser: 0.97 mg/dL (ref 0.61–1.24)
GFR calc Af Amer: >60 mL/min (ref >60)
GFR calc non Af Amer: >60 mL/min (ref >60)
Glucose, Bld: 108 mg/dL — ABNORMAL HIGH (ref 70–99)
Potassium: 3.8 mmol/L (ref 3.5–5.1)
Sodium: 139 mmol/L (ref 135–145)
Total Bilirubin: 0.6 mg/dL (ref 0.3–1.2)
Total Protein: 6.8 g/dL (ref 6.5–8.1)

## 2019-01-13 LAB — CBC
HCT: 41.9 % (ref 39.0–52.0)
Hemoglobin: 14.2 g/dL (ref 13.0–17.0)
MCH: 30.3 pg (ref 26.0–34.0)
MCHC: 33.9 g/dL (ref 30.0–36.0)
MCV: 89.5 fL (ref 80.0–100.0)
Platelets: 207 10*3/uL (ref 150–400)
RBC: 4.68 MIL/uL (ref 4.22–5.81)
RDW: 12.8 % (ref 11.5–15.5)
WBC: 7.3 10*3/uL (ref 4.0–10.5)
nRBC: 0 % (ref 0.0–0.2)

## 2019-01-13 LAB — BRAIN NATRIURETIC PEPTIDE: B Natriuretic Peptide: 890 pg/mL — ABNORMAL HIGH (ref 0.0–100.0)

## 2019-01-13 LAB — TROPONIN I: Troponin I: <0.03 ng/mL (ref <0.03)

## 2019-01-13 MED ORDER — IOHEXOL 350 MG/ML SOLN
100.0000 mL | Freq: Once | INTRAVENOUS | Status: AC | PRN
  Administered 2019-01-13: 100 mL via INTRAVENOUS

## 2019-01-13 NOTE — ED Notes (Signed)
Patient transported to X-ray 

## 2019-01-13 NOTE — ED Provider Notes (Signed)
[facility] Emergency Department Provider Note   ____________________________________________    I have reviewed the triage vital signs and the nursing notes.   HISTORY  Chief Complaint Shortness of Breath     HPI Henry Dunn is a 43 y.o. male with a history of A. fib, DVT with IVC filter as well as a history of PE in the past who presents today with complaints of worsening shortness of breath over the last several weeks.  Today he presented because he had a small amount of hemoptysis.  He also has pain in his right lower chest which he says feels similar to when he had a PE in the past.  Seen in the emergency department several weeks ago found to have possible mild interstitial edema, treated with 1 dose of IV Lasix.  He reports he has cardiology follow-up in about a week.  Notes shortness of breath has been gradually worsening and when he walks to the bathroom from his bedroom he gets "winded ".  Reports compliance with his Xarelto  Past Medical History:  Diagnosis Date  . A-fib (HCC)   . DVT (deep venous thrombosis) (HCC)    a. In setting of dislocated hip, status post IVC filter, previously on Coumadin, noncompliant over the past 12 months  . Hx of blood clots    a. Patient denies any family history of clotting disorder, patient denies prior hypercoagulable workup  . PE (pulmonary thromboembolism) [facility])     Patient Active Problem List   Diagnosis Date Noted  . Lymphedema 11/07/2018  . 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 Leg pain, left 06/21/2018  . Cellulitis and abscess of leg   . New onset atrial flutter (HCC) 02/18/2017  . Chronic deep vein thrombosis (DVT) (HCC) 02/18/2017  . Tobacco abuse 02/18/2017  . DVT (deep venous thrombosis) (HCC) 02/18/2017  . Shortness of breath 02/17/2017    Past Surgical History:  Procedure Laterality Date  . INSERTION OF VENA CAVA FILTER    . PERIPHERAL VASCULAR THROMBECTOMY Right 03/18/2018   Procedure:  PERIPHERAL VASCULAR THROMBECTOMY;  Surgeon: Renford Dills, MD;  Location: [facility] INVASIVE CV LAB;  Service: Cardiovascular;  Laterality: Right;    Prior to Admission medications   Medication Sig Start Date End Date Taking? Authorizing Provider  acetaminophen (TYLENOL) 325 MG tablet Take 2 tablets (650 mg total) by mouth every 6 (six) hours as needed for mild pain (or Fever >/= 101). Patient not taking: Reported on 11/07/2018 03/19/18   Bertram Denver, MD  aspirin EC 81 MG EC tablet Take 1 tablet (81 mg total) by mouth daily. 03/20/18 04/19/18  Esco, Morton Peters, MD  metoprolol succinate (TOPROL-XL) 25 MG 24 hr tablet Take 1 tablet (25 mg total) by mouth daily. 01/06/19   Jene Every, MD  oxyCODONE-acetaminophen (PERCOCET) 5-325 MG tablet Take 1-2 tablets by mouth every 6 (six) hours as needed for severe pain. Patient not taking: Reported on 05/05/2018 03/23/18   Schnier, Latina Craver, MD  Rivaroxaban (XARELTO) 15 MG TABS tablet Take 1 tablet (15 mg total) by mouth 2 (two) times daily. Patient not taking: Reported on 05/05/2018 03/23/18   Schnier, Latina Craver, MD  rivaroxaban (XARELTO) 20 MG TABS tablet Take 1 tablet (20 mg total) by mouth daily with supper. This is to begin after he has finished the 15 mg bid Prescription  For 21 days Patient not taking: Reported on 11/07/2018 04/12/18   Schnier, Latina Craver, MD  XARELTO 20 MG TABS tablet TAKE  1 TABLET BY MOUTH ONCE DAILY 05/09/18   Antonieta Iba, MD     Allergies Patient has no known allergies.  Family History  Problem Relation Age of Onset  . Diabetes Neg Hx   . Hypertension Neg Hx     Social History Social History   Tobacco Use  . Smoking status: Current Every Day Smoker    Packs/day: 0.50    Types: Cigarettes  . Smokeless tobacco: Never Used  Substance Use Topics  . Alcohol use: Yes    Alcohol/week: 12.0 standard drinks    Types: 12 Cans of beer per week  . Drug use: Not Currently    Review of Systems  Constitutional: No  fever/chills Eyes: No visual changes.  ENT: No sore throat. Cardiovascular: Denies chest pain. Respiratory: Denies shortness of breath. Gastrointestinal: No abdominal pain.  No nausea, no vomiting.   Genitourinary: Negative for dysuria. Musculoskeletal: Negative for back pain. Skin: Negative for rash. Neurological: Negative for headaches or weakness   ____________________________________________   PHYSICAL EXAM:  VITAL SIGNS: ED Triage Vitals  Enc Vitals Group     BP 01/13/19 0219 111/84     Pulse Rate 01/13/19 0219 88     Resp 01/13/19 0219 15     Temp 01/13/19 0219 97.6 F (36.4 C)     Temp Source 01/13/19 0219 Oral     SpO2 01/13/19 0219 97 %     Weight --      Height --      Head Circumference --      Peak Flow --      Pain Score 01/13/19 0214 7     Pain Loc --      Pain Edu? --      Excl. in GC? --     Constitutional: Alert and oriented. No acute distress.   Nose: No congestion/rhinnorhea.  Cardiovascular: Normal rate, regular rhythm. Grossly normal heart sounds.  Good peripheral circulation. Respiratory: Mild tachypnea noted.  No retractions. Lungs CTAB. Gastrointestinal: Soft and nontender. No distention.   Musculoskeletal:  Warm and well perfused Neurologic:  Normal speech and language. No gross focal neurologic deficits are appreciated.  Skin:  Skin is warm, dry and intact. No rash noted. Psychiatric: Mood and affect are normal. Speech and behavior are normal.  ____________________________________________   LABS (all labs ordered are listed, but only abnormal results are displayed)  Labs Reviewed  COMPREHENSIVE METABOLIC PANEL - Abnormal; Notable for the following components:      Result Value   CO2 21 (*)    Glucose, Bld 108 (*)    All other components within normal limits  BRAIN NATRIURETIC PEPTIDE - Abnormal; Notable for the following components:   B Natriuretic Peptide 890.0 (*)    All other components within normal limits  CBC  TROPONIN I    ____________________________________________  EKG  ED ECG REPORT I, Jene Every, the attending physician, personally viewed and interpreted this ECG.  Date: 01/13/2019  Rate: 108 Rhythm: Atrial fibrillation QRS Axis: normal Intervals: normal ST/T Wave abnormalities: Nonspecific changes   ____________________________________________  RADIOLOGY  Chest x-ray negative for edema, cardiomegaly noted ____________________________________________   PROCEDURES  Procedure(s) performed: No  Procedures   Critical Care performed: No ____________________________________________   INITIAL IMPRESSION / ASSESSMENT AND PLAN / ED COURSE  Pertinent labs & imaging results that were available during my care of the patient were reviewed by me and considered in my medical decision making (see chart for details).  Patient with a history  of DVT/PE on Xarelto with IVC filter presents with right lower chest discomfort, hemoptysis and worsening shortness of breath over the last several weeks.  He does report compliance with Xarelto but regardless significant concern for PE.  Chest x-ray demonstrates cardiomegaly but no edema.  Lab work is overall unremarkable, will obtain CT angiography  CTA chest negative for PE, large lymph nodes discussed with patient and the need for follow-up CT discussed as well.  Patient is relieved by no PEs, has close cardiology follow-up, return precautions discussed    ____________________________________________   FINAL CLINICAL IMPRESSION(S) / ED DIAGNOSES  Final diagnoses:  Shortness of breath        Note:  This document was prepared using Dragon voice recognition software and may include unintentional dictation errors.   Jene Every, MD 01/13/19 [phone]

## 2019-01-13 NOTE — Discharge Instructions (Addendum)
Follow up CT scan of chest needed in 6 months to monitor lymph nodes

## 2019-01-13 NOTE — ED Triage Notes (Signed)
Patient ambulatory to triage with steady gait, without difficulty or distress noted; pt reports persistent SHOB, pain to right lower back with coughing and hemoptysis noted today; seen 2/7 for same and told SHOB was from elevated HR and "fluid around lungs"; hx PE & afib, currently taking xarelto

## 2019-02-01 ENCOUNTER — Other Ambulatory Visit: Payer: Self-pay | Admitting: Cardiovascular Disease

## 2019-02-01 ENCOUNTER — Telehealth: Payer: Self-pay | Admitting: *Deleted

## 2019-02-01 NOTE — Telephone Encounter (Signed)
Please review for refill.  

## 2019-02-01 NOTE — Telephone Encounter (Signed)
Received a refill request for the patients Xarelto. Noticed that the pt has not seen Dr. Mariah Milling since 2018, therefore, called the pt and he explained to me that he has been going to [facility] to see his new Physicians. He states that they have been filling his meds. Advised him that we received a refill for his Xarelto but since he hasn't seen Dr. Mariah Milling in 2 years he would need an appt. He stated he has a Astronomer in [place] and his Doctor at the [facility] in [place] will take care of the refill. Pt will call [facility] to update them with the correct provider. Patient thanked me for calling and stated he would take care of this. Denied refill and pt is aware.

## 2019-02-06 DIAGNOSIS — I890 Lymphedema, not elsewhere classified: Secondary | ICD-10-CM

## 2019-02-21 ENCOUNTER — Inpatient Hospital Stay
Admission: EM | Admit: 2019-02-21 | Discharge: 2019-02-23 | DRG: 293 | Disposition: A | Discharge status: Home / Self Care | Payer: Medicaid Other | Attending: Internal Medicine | Admitting: Internal Medicine

## 2019-02-21 ENCOUNTER — Other Ambulatory Visit: Payer: Self-pay

## 2019-02-21 ENCOUNTER — Emergency Department: Payer: Medicaid Other

## 2019-02-21 DIAGNOSIS — I480 Paroxysmal atrial fibrillation: Secondary | ICD-10-CM | POA: Diagnosis present

## 2019-02-21 DIAGNOSIS — Z7901 Long term (current) use of anticoagulants: Secondary | ICD-10-CM

## 2019-02-21 DIAGNOSIS — Z716 Tobacco abuse counseling: Secondary | ICD-10-CM

## 2019-02-21 DIAGNOSIS — Z86711 Personal history of pulmonary embolism: Secondary | ICD-10-CM

## 2019-02-21 DIAGNOSIS — R000 Tachycardia, unspecified: Secondary | ICD-10-CM | POA: Diagnosis present

## 2019-02-21 DIAGNOSIS — Z86718 Personal history of other venous thrombosis and embolism: Secondary | ICD-10-CM | POA: Diagnosis not present

## 2019-02-21 DIAGNOSIS — Z95828 Presence of other vascular implants and grafts: Secondary | ICD-10-CM

## 2019-02-21 DIAGNOSIS — Z79899 Other long term (current) drug therapy: Secondary | ICD-10-CM | POA: Diagnosis not present

## 2019-02-21 DIAGNOSIS — F17210 Nicotine dependence, cigarettes, uncomplicated: Secondary | ICD-10-CM | POA: Diagnosis present

## 2019-02-21 DIAGNOSIS — R0602 Shortness of breath: Secondary | ICD-10-CM

## 2019-02-21 DIAGNOSIS — R0600 Dyspnea, unspecified: Secondary | ICD-10-CM

## 2019-02-21 DIAGNOSIS — I509 Heart failure, unspecified: Secondary | ICD-10-CM

## 2019-02-21 DIAGNOSIS — Z7982 Long term (current) use of aspirin: Secondary | ICD-10-CM

## 2019-02-21 DIAGNOSIS — I5021 Acute systolic (congestive) heart failure: Secondary | ICD-10-CM | POA: Diagnosis present

## 2019-02-21 LAB — CBC WITH DIFFERENTIAL/PLATELET
Abs Immature Granulocytes: 0.02 10*3/uL (ref 0.00–0.07)
Basophils Absolute: 0 10*3/uL (ref 0.0–0.1)
Basophils Relative: 0 %
Eosinophils Absolute: 0.1 10*3/uL (ref 0.0–0.5)
Eosinophils Relative: 2 %
HCT: 44.9 % (ref 39.0–52.0)
Hemoglobin: 14.6 g/dL (ref 13.0–17.0)
Immature Granulocytes: 0 %
Lymphocytes Relative: 35 %
Lymphs Abs: 2.7 10*3/uL (ref 0.7–4.0)
MCH: 30.2 pg (ref 26.0–34.0)
MCHC: 32.5 g/dL (ref 30.0–36.0)
MCV: 93 fL (ref 80.0–100.0)
Monocytes Absolute: 0.6 10*3/uL (ref 0.1–1.0)
Monocytes Relative: 7 %
Neutro Abs: 4.3 10*3/uL (ref 1.7–7.7)
Neutrophils Relative %: 56 %
Platelets: 197 10*3/uL (ref 150–400)
RBC: 4.83 MIL/uL (ref 4.22–5.81)
RDW: 13.4 % (ref 11.5–15.5)
WBC: 7.8 10*3/uL (ref 4.0–10.5)
nRBC: 0 % (ref 0.0–0.2)

## 2019-02-21 LAB — HEPATIC FUNCTION PANEL
ALT: 23 U/L (ref 0–44)
AST: 25 U/L (ref 15–41)
Albumin: 4 g/dL (ref 3.5–5.0)
Alkaline Phosphatase: 55 U/L (ref 38–126)
Bilirubin, Direct: 0.2 mg/dL (ref 0.0–0.2)
Indirect Bilirubin: 0.6 mg/dL (ref 0.3–0.9)
Total Bilirubin: 0.8 mg/dL (ref 0.3–1.2)
Total Protein: 6.8 g/dL (ref 6.5–8.1)

## 2019-02-21 LAB — BASIC METABOLIC PANEL
Anion gap: 9 (ref 5–15)
BUN: 15 mg/dL (ref 6–20)
CO2: 20 mmol/L — ABNORMAL LOW (ref 22–32)
Calcium: 8.6 mg/dL — ABNORMAL LOW (ref 8.9–10.3)
Chloride: 104 mmol/L (ref 98–111)
Creatinine, Ser: 1.1 mg/dL (ref 0.61–1.24)
GFR calc Af Amer: >60 mL/min (ref >60)
GFR calc non Af Amer: >60 mL/min (ref >60)
Glucose, Bld: 125 mg/dL — ABNORMAL HIGH (ref 70–99)
Potassium: 4 mmol/L (ref 3.5–5.1)
Sodium: 133 mmol/L — ABNORMAL LOW (ref 135–145)

## 2019-02-21 LAB — BRAIN NATRIURETIC PEPTIDE: B Natriuretic Peptide: 1817 pg/mL — ABNORMAL HIGH (ref 0.0–100.0)

## 2019-02-21 LAB — TROPONIN I: Troponin I: <0.03 ng/mL (ref <0.03)

## 2019-02-21 MED ORDER — FUROSEMIDE 10 MG/ML IJ SOLN
40.0000 mg | Freq: Two times a day (BID) | INTRAMUSCULAR | Status: DC
  Administered 2019-02-22 – 2019-02-23 (×2): 40 mg via INTRAVENOUS
  Filled 2019-02-21 (×2): qty 4

## 2019-02-21 MED ORDER — LISINOPRIL 5 MG PO TABS
2.5000 mg | ORAL_TABLET | Freq: Every day | ORAL | Status: DC
  Administered 2019-02-22 – 2019-02-23 (×2): 2.5 mg via ORAL
  Filled 2019-02-21 (×2): qty 1

## 2019-02-21 MED ORDER — SODIUM CHLORIDE 0.9 % IV SOLN: 250.0000 mL | INTRAVENOUS | Status: DC | PRN

## 2019-02-21 MED ORDER — METOPROLOL TARTRATE 25 MG PO TABS
25.0000 mg | ORAL_TABLET | Freq: Two times a day (BID) | ORAL | Status: DC
  Administered 2019-02-22: 25 mg via ORAL
  Filled 2019-02-21 (×2): qty 1

## 2019-02-21 MED ORDER — ALBUTEROL SULFATE HFA 108 (90 BASE) MCG/ACT IN AERS: 2.0000 | INHALATION_SPRAY | Freq: Once | RESPIRATORY_TRACT | Status: DC

## 2019-02-21 MED ORDER — RIVAROXABAN 20 MG PO TABS
20.0000 mg | ORAL_TABLET | Freq: Every day | ORAL | Status: DC
  Administered 2019-02-22 – 2019-02-23 (×2): 20 mg via ORAL
  Filled 2019-02-21 (×2): qty 1

## 2019-02-21 MED ORDER — FUROSEMIDE 10 MG/ML IJ SOLN
60.0000 mg | Freq: Once | INTRAMUSCULAR | Status: AC
  Administered 2019-02-21: 60 mg via INTRAVENOUS
  Filled 2019-02-21: qty 6

## 2019-02-21 MED ORDER — SODIUM CHLORIDE 0.9% FLUSH
3.0000 mL | INTRAVENOUS | Status: DC | PRN
  Administered 2019-02-21: 3 mL via INTRAVENOUS
  Filled 2019-02-21: qty 3

## 2019-02-21 MED ORDER — ONDANSETRON HCL 4 MG/2ML IJ SOLN: 4.0000 mg | Freq: Four times a day (QID) | INTRAMUSCULAR | Status: DC | PRN

## 2019-02-21 MED ORDER — SODIUM CHLORIDE 0.9% FLUSH
3.0000 mL | Freq: Two times a day (BID) | INTRAVENOUS | Status: DC
  Administered 2019-02-21 – 2019-02-23 (×4): 3 mL via INTRAVENOUS

## 2019-02-21 MED ORDER — IOHEXOL 350 MG/ML SOLN
75.0000 mL | Freq: Once | INTRAVENOUS | Status: AC | PRN
  Administered 2019-02-21: 75 mL via INTRAVENOUS
  Filled 2019-02-21: qty 75

## 2019-02-21 MED ORDER — ACETAMINOPHEN 325 MG PO TABS
650.0000 mg | ORAL_TABLET | ORAL | Status: DC | PRN
  Administered 2019-02-21: 650 mg via ORAL
  Filled 2019-02-21: qty 2

## 2019-02-21 MED ORDER — SODIUM CHLORIDE 0.9 % IV BOLUS: 1000.0000 mL | Freq: Once | INTRAVENOUS | Status: DC

## 2019-02-21 MED ORDER — ASPIRIN EC 81 MG PO TBEC
81.0000 mg | DELAYED_RELEASE_TABLET | Freq: Every day | ORAL | Status: DC
  Administered 2019-02-21 – 2019-02-23 (×3): 81 mg via ORAL
  Filled 2019-02-21 (×3): qty 1

## 2019-02-21 NOTE — H&P (Signed)
[employer] - [place] at [facility]   PATIENT NAME: Henry Dunn    MR#:  [id number]  DATE OF BIRTH:  11-09-1976  DATE OF ADMISSION:  02/21/2019  PRIMARY CARE PHYSICIAN: Patient, No Pcp Per   REQUESTING/REFERRING PHYSICIAN: Jeanmarie Plant, MD  CHIEF COMPLAINT:   Chief Complaint  Patient presents with  . Shortness of Breath  . Cough    HISTORY OF PRESENT ILLNESS: Henry Dunn  is a 43 y.o. male with a known history of Afib, DVT, PE who is presenting to the emergency room with complaint of shortness of breath and cough.  Patient states that he is progressively been getting short of breath for the past 1 month.  He states that he also has noticed swelling in his abdomen.  Patient has been seen by cardiology at [facility].  There he states that he had a echocardiogram of the heart done I am unable to pull this report but was told that his heart is very weak.  Patient also has been having nocturnal dyspnea.  Exertional dyspnea.  He denies any chest pain or palpitations no fevers or chills.  PAST MEDICAL HISTORY:   Past Medical History:  Diagnosis Date  . A-fib (HCC)   . DVT (deep venous thrombosis) (HCC)    a. In setting of dislocated hip, status post IVC filter, previously on Coumadin, noncompliant over the past 12 months  . Hx of blood clots    a. Patient denies any family history of clotting disorder, patient denies prior hypercoagulable workup  . PE (pulmonary thromboembolism) (HCC)     PAST SURGICAL HISTORY:  Past Surgical History:  Procedure Laterality Date  . INSERTION OF VENA CAVA FILTER    . PERIPHERAL VASCULAR THROMBECTOMY Right 03/18/2018   Procedure: PERIPHERAL VASCULAR THROMBECTOMY;  Surgeon: Renford Dills, MD;  Location: [facility] INVASIVE CV LAB;  Service: Cardiovascular;  Laterality: Right;    SOCIAL HISTORY:  Social History   Tobacco Use  . Smoking status: Current Every Day Smoker    Packs/day: 0.50    Types: Cigarettes  . Smokeless tobacco: Never  Used  Substance Use Topics  . Alcohol use: Yes    Alcohol/week: 12.0 standard drinks    Types: 12 Cans of beer per week    FAMILY HISTORY:  Family History  Problem Relation Age of Onset  . Diabetes Neg Hx   . Hypertension Neg Hx     DRUG ALLERGIES: No Known Allergies  REVIEW OF SYSTEMS:   CONSTITUTIONAL: No fever, fatigue or weakness.  EYES: No blurred or double vision.  EARS, NOSE, AND THROAT: No tinnitus or ear pain.  RESPIRATORY: No cough, positive shortness of breath, wheezing or hemoptysis.  CARDIOVASCULAR: No chest pain, positive orthopnea, positive edema.  GASTROINTESTINAL: No nausea, vomiting, diarrhea or abdominal pain.  GENITOURINARY: No dysuria, hematuria.  ENDOCRINE: No polyuria, nocturia,  HEMATOLOGY: No anemia, easy bruising or bleeding SKIN: No rash or lesion. MUSCULOSKELETAL: No joint pain or arthritis.   NEUROLOGIC: No tingling, numbness, weakness.  PSYCHIATRY: No anxiety or depression.   MEDICATIONS AT HOME:  Prior to Admission medications   Medication Sig Start Date End Date Taking? Authorizing Provider  acetaminophen (TYLENOL) 325 MG tablet Take 2 tablets (650 mg total) by mouth every 6 (six) hours as needed for mild pain (or Fever >/= 101). Patient not taking: Reported on 11/07/2018 03/19/18   Bertram Denver, MD  aspirin EC 81 MG EC tablet Take 1 tablet (81 mg total) by mouth daily. 03/20/18  04/19/18  Esco, Gilman Buttner A, MD  metoprolol succinate (TOPROL-XL) 25 MG 24 hr tablet Take 1 tablet (25 mg total) by mouth daily. 01/06/19   Jene Every, MD  oxyCODONE-acetaminophen (PERCOCET) 5-325 MG tablet Take 1-2 tablets by mouth every 6 (six) hours as needed for severe pain. Patient not taking: Reported on 05/05/2018 03/23/18   Schnier, Latina Craver, MD  Rivaroxaban (XARELTO) 15 MG TABS tablet Take 1 tablet (15 mg total) by mouth 2 (two) times daily. Patient not taking: Reported on 05/05/2018 03/23/18   Schnier, Latina Craver, MD  rivaroxaban (XARELTO) 20 MG TABS tablet Take 1  tablet (20 mg total) by mouth daily with supper. This is to begin after he has finished the 15 mg bid Prescription  For 21 days Patient not taking: Reported on 11/07/2018 04/12/18   Schnier, Latina Craver, MD  XARELTO 20 MG TABS tablet TAKE 1 TABLET BY MOUTH ONCE DAILY 05/09/18   Antonieta Iba, MD      PHYSICAL EXAMINATION:   VITAL SIGNS: Blood pressure (!) 135/112, pulse (!) 115, temperature (!) 97.4 F (36.3 C), temperature source Oral, resp. rate 20, height 5\' 9"  (1.753 m), weight 95.3 kg, SpO2 96 %.  GENERAL:  43 y.o.-year-old patient lying in the bed with no acute distress.  EYES: Pupils equal, round, reactive to light and accommodation. No scleral icterus. Extraocular muscles intact.  HEENT: Head atraumatic, normocephalic. Oropharynx and nasopharynx clear.  NECK:  Supple, no jugular venous distention. No thyroid enlargement, no tenderness.  LUNGS: Bilateral crackles at the bases no accessory muscle usage  CARDIOVASCULAR: Irregularly irregular,  No murmurs, rubs, or gallops.  ABDOMEN: Soft, nontender, nondistended. Bowel sounds present. No organomegaly or mass.  EXTREMITIES: No pedal edema, cyanosis, or clubbing.  NEUROLOGIC: Cranial nerves II through XII are intact. Muscle strength 5/5 in all extremities. Sensation intact. Gait not checked.  PSYCHIATRIC: The patient is alert and oriented x 3.  SKIN: No obvious rash, lesion, or ulcer.   LABORATORY PANEL:   CBC Recent Labs  Lab 02/21/19 1711  WBC 7.8  HGB 14.6  HCT 44.9  PLT 197  MCV 93.0  MCH 30.2  MCHC 32.5  RDW 13.4  LYMPHSABS 2.7  MONOABS 0.6  EOSABS 0.1  BASOSABS 0.0   ------------------------------------------------------------------------------------------------------------------  Chemistries  Recent Labs  Lab 02/21/19 1711  NA 133*  K 4.0  CL 104  CO2 20*  GLUCOSE 125*  BUN 15  CREATININE 1.10  CALCIUM 8.6*    ------------------------------------------------------------------------------------------------------------------ estimated creatinine clearance is 99.6 mL/min (by C-G formula based on SCr of 1.1 mg/dL). ------------------------------------------------------------------------------------------------------------------ No results for input(s): TSH, T4TOTAL, T3FREE, THYROIDAB in the last 72 hours.  Invalid input(s): FREET3   Coagulation profile No results for input(s): INR, PROTIME in the last 168 hours. ------------------------------------------------------------------------------------------------------------------- No results for input(s): DDIMER in the last 72 hours. -------------------------------------------------------------------------------------------------------------------  Cardiac Enzymes Recent Labs  Lab 02/21/19 1711  TROPONINI <0.03   ------------------------------------------------------------------------------------------------------------------ Invalid input(s): POCBNP  ---------------------------------------------------------------------------------------------------------------  Urinalysis    Component Value Date/Time   COLORURINE YELLOW (A) 02/17/2017 1608   APPEARANCEUR CLEAR (A) 02/17/2017 1608   LABSPEC >1.046 (H) 02/17/2017 1608   PHURINE 5.0 02/17/2017 1608   GLUCOSEU NEGATIVE 02/17/2017 1608   HGBUR NEGATIVE 02/17/2017 1608   BILIRUBINUR NEGATIVE 02/17/2017 1608   KETONESUR NEGATIVE 02/17/2017 1608   PROTEINUR NEGATIVE 02/17/2017 1608   NITRITE NEGATIVE 02/17/2017 1608   LEUKOCYTESUR NEGATIVE 02/17/2017 1608     RADIOLOGY: Ct Angio Chest Pe W And/or Wo Contrast  Result Date: 02/21/2019 CLINICAL  DATA:  43 year old male with history of DVT, atrial fibrillation and PE. Progressive shortness of breath. EXAM: CT ANGIOGRAPHY CHEST WITH CONTRAST TECHNIQUE: Multidetector CT imaging of the chest was performed using the standard protocol during bolus  administration of intravenous contrast. Multiplanar CT image reconstructions and MIPs were obtained to evaluate the vascular anatomy. CONTRAST:  79mL OMNIPAQUE IOHEXOL 350 MG/ML SOLN COMPARISON:  Chest CTA 01/13/2019 and earlier. FINDINGS: Cardiovascular: Adequate contrast bolus timing in the pulmonary arterial tree. No focal filling defect identified in the pulmonary arteries to suggest acute pulmonary embolism. Cardiomegaly, stable to mildly progressed since February. No pericardial effusion. No contrast in the aorta today. Calcified coronary artery atherosclerosis on series 5, image 139. Contrast reflux into the azygos and paravertebral veins. Mediastinum/Nodes: Mildly enlarged mediastinal lymph nodes again noted, with a reactive appearance. No improvement since January. No new mediastinal abnormality. Lungs/Pleura: Small bilateral layering pleural effusions have increased. Major airways remain patent. Stable scarring in the lingula. The 9 millimeter area of ground-glass opacity at the junction of the right middle and upper lobes has regressed (series 6, image 50 today). Mildly lower lung volumes overall. Mild generalized ground-glass opacity compared to the prior. Upper Abdomen: New small volume ascites about the liver and spleen. New mild to moderate fat stranding around the gallbladder (series 4, image 106). Negative visible pancreas, adrenal glands, bowel. Stable visible renal upper poles. Musculoskeletal: No acute osseous abnormality identified. Review of the MIP images confirms the above findings. IMPRESSION: 1. Negative for acute pulmonary embolus. 2. Cardiomegaly which may have mildly progressed since February. Persistent reactive appearing mediastinal lymph nodes. Increased small layering pleural effusions. Subtle generalized ground-glass opacity. New small volume ascites in the upper abdomen. This constellation favors interval volume overload, mild pulmonary edema, mild congestive heart failure. 3. New  mild to moderate fat stranding about the gallbladder is nonspecific but favored due to #2 in the absence of acute right upper quadrant pain. Follow-up Right Upper Quadrant Ultrasound may be valuable in this clinical setting. 4. Calcified coronary artery atherosclerosis. Electronically Signed   By: Odessa Fleming M.D.   On: 02/21/2019 19:11   Dg Chest Portable 1 View  Result Date: 02/21/2019 CLINICAL DATA:  Nonproductive cough, shortness of breath. EXAM: PORTABLE CHEST 1 VIEW COMPARISON:  CT scan and radiographs of January 13, 2019. FINDINGS: Stable cardiomegaly. No pneumothorax or pleural effusion is noted. Both lungs are clear. The visualized skeletal structures are unremarkable. IMPRESSION: No active disease. Electronically Signed   By: Lupita Raider, M.D.   On: 02/21/2019 17:14    EKG: Orders placed or performed during the hospital encounter of 02/21/19  . EKG 12-Lead  . EKG 12-Lead  . ED EKG  . ED EKG    IMPRESSION AND PLAN: Patient is 43 year old presenting with shortness of breath  1.  Acute CHF likely systolic treat with IV Lasix I will start patient on low-dose ACE inhibitor Metoprolol due to atrial fibrillation Cardiology consult Repeat echo here since were not able to review echo done at [facility]  2.  Atrial fibrillation continue metoprolol Continue Xarelto  3.  History of pulmonary embolism, DVT continue Xarelto  4.  Nicotine abuse smoking cessation provided 4 minutes spent strongly recommend he stop smoking      All the records are reviewed and case discussed with ED provider. Management plans discussed with the patient, family and they are in agreement.  CODE STATUS: Code Status History    Date Active Date Inactive Code Status Order ID Comments User  Context   03/18/2018 1215 03/23/2018 1813 Full Code [id number]  Renford Dills, MD Inpatient   02/17/2017 2209 02/25/2017 1620 Full Code [id number]  Houston Siren, MD Inpatient       TOTAL TIME TAKING CARE OF THIS  PATIENT: 55 minutes.    Auburn Bilberry M.D on 02/21/2019 at 7:29 PM  Between 7am to 6pm - Pager - [phone]  After 6pm go to [email or web address] - password [email or web address] [facility]  [employer] Office  [phone]  CC: Primary care physician; Patient, No Pcp Per

## 2019-02-21 NOTE — ED Provider Notes (Addendum)
[facility] Emergency Department Provider Note  ____________________________________________   I have reviewed the triage vital signs and the nursing notes. Where available I have reviewed prior notes and, if possible and indicated, outside hospital notes.    HISTORY  Chief Complaint Shortness of Breath and Cough    HPI Henry Dunn is a 43 y.o. male history of DVT, atrial fibrillation, pulmonary embolism, chronic shortness of breath he states he is been short of breath now for the last several months and no one can figure out why.  He has been here twice for this he states.  He states is been no change but he is anxious.  He told triage she had a cough, he states sometimes he has a dry cough that is been there for months.  Patient has had extensive work-up for this including CT scans.  He has had no evidence of recurrent coronary embolism.  He denies any chest pain.  He denies any fever or URI symptoms.  Patient states he is compliant with his medications.  He states he gets anxious about his shortness of breath.  He states he went to see his primary cardiologist, and they did an echo and said that his heart was not beating efficiently.  He states that they started him on Lorazepam afterwards.  At least, that is what he believes the medication was called.  He is not sure if he is taking it.  He denies any change in his chronic leg swelling.  He has no known sick contacts.  He does not drink alcohol on a regular basis.  He states he usually will be on the weekends.  He is never had alcohol withdrawal.  He states he is here because he is anxious about his breathing.  Does endorse orthopnea.  Is on Xarelto and he states he is compliant with his medications.  Past Medical History:  Diagnosis Date  . A-fib (HCC)   . DVT (deep venous thrombosis) (HCC)    a. In setting of dislocated hip, status post IVC filter, previously on Coumadin, noncompliant over the past 12 months  .  Hx of blood clots    a. Patient denies any family history of clotting disorder, patient denies prior hypercoagulable workup  . PE (pulmonary thromboembolism) [facility])     Patient Active Problem List   Diagnosis Date Noted  . Lymphedema 11/07/2018  . 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 Leg pain, left 06/21/2018  . Cellulitis and abscess of leg   . New onset atrial flutter (HCC) 02/18/2017  . Chronic deep vein thrombosis (DVT) (HCC) 02/18/2017  . Tobacco abuse 02/18/2017  . DVT (deep venous thrombosis) (HCC) 02/18/2017  . Shortness of breath 02/17/2017    Past Surgical History:  Procedure Laterality Date  . INSERTION OF VENA CAVA FILTER    . PERIPHERAL VASCULAR THROMBECTOMY Right 03/18/2018   Procedure: PERIPHERAL VASCULAR THROMBECTOMY;  Surgeon: Renford Dills, MD;  Location: [facility] INVASIVE CV LAB;  Service: Cardiovascular;  Laterality: Right;    Prior to Admission medications   Medication Sig Start Date End Date Taking? Authorizing Provider  acetaminophen (TYLENOL) 325 MG tablet Take 2 tablets (650 mg total) by mouth every 6 (six) hours as needed for mild pain (or Fever >/= 101). Patient not taking: Reported on 11/07/2018 03/19/18   Bertram Denver, MD  aspirin EC 81 MG EC tablet Take 1 tablet (81 mg total) by mouth daily. 03/20/18 04/19/18  Esco, Morton Peters, MD  metoprolol succinate (TOPROL-XL) 25  MG 24 hr tablet Take 1 tablet (25 mg total) by mouth daily. 01/06/19   Jene Every, MD  oxyCODONE-acetaminophen (PERCOCET) 5-325 MG tablet Take 1-2 tablets by mouth every 6 (six) hours as needed for severe pain. Patient not taking: Reported on 05/05/2018 03/23/18   Schnier, Latina Craver, MD  Rivaroxaban (XARELTO) 15 MG TABS tablet Take 1 tablet (15 mg total) by mouth 2 (two) times daily. Patient not taking: Reported on 05/05/2018 03/23/18   Schnier, Latina Craver, MD  rivaroxaban (XARELTO) 20 MG TABS tablet Take 1 tablet (20 mg total) by mouth daily with supper. This is to begin after he has finished  the 15 mg bid Prescription  For 21 days Patient not taking: Reported on 11/07/2018 04/12/18   Schnier, Latina Craver, MD  XARELTO 20 MG TABS tablet TAKE 1 TABLET BY MOUTH ONCE DAILY 05/09/18   Antonieta Iba, MD    Allergies Patient has no known allergies.  Family History  Problem Relation Age of Onset  . Diabetes Neg Hx   . Hypertension Neg Hx     Social History Social History   Tobacco Use  . Smoking status: Current Every Day Smoker    Packs/day: 0.50    Types: Cigarettes  . Smokeless tobacco: Never Used  Substance Use Topics  . Alcohol use: Yes    Alcohol/week: 12.0 standard drinks    Types: 12 Cans of beer per week  . Drug use: Not Currently    Review of Systems Constitutional: No fever/chills Eyes: No visual changes. ENT: No sore throat. No stiff neck no neck pain Cardiovascular: Denies chest pain. Respiratory: +shortness of breath. Gastrointestinal:   no vomiting.  No diarrhea.  No constipation. Genitourinary: Negative for dysuria. Musculoskeletal: Mild bilateral lower extremity swelling Skin: Negative for rash. Neurological: Negative for severe headaches, focal weakness or numbness.   ____________________________________________   PHYSICAL EXAM:  VITAL SIGNS: ED Triage Vitals [02/21/19 1643]  Enc Vitals Group     BP (!) 135/112     Pulse Rate (!) 127     Resp (!) 24     Temp (!) 97.4 F (36.3 C)     Temp Source Oral     SpO2 99 %     Weight 210 lb (95.3 kg)     Height 5\' 9"  (1.753 m)     Head Circumference      Peak Flow      Pain Score 0     Pain Loc      Pain Edu?      Excl. in GC?     Constitutional: Alert and oriented.  Patient does get winded when he moves around the bed. Eyes: Conjunctivae are normal Head: Atraumatic HEENT: No congestion/rhinnorhea. Mucous membranes are moist.  Oropharynx non-erythematous Neck:   Nontender with no meningismus, no masses, no stridor Cardiovascular: Normal rate, regular rhythm. Grossly normal heart  sounds.  Good peripheral circulation. Respiratory: Normal respiratory effort.  No retractions.  The bases are diminished with occasional rales. Abdominal: Soft and nontender. No distention. No guarding no rebound Back:  There is no focal tenderness or step off.  there is no midline tenderness there are no lesions noted. there is no CVA tenderness Musculoskeletal: No lower extremity tenderness, no upper extremity tenderness. No joint effusions, no DVT signs strong distal pulses bilateral mild edema Neurologic:  Normal speech and language. No gross focal neurologic deficits are appreciated.  Skin:  Skin is warm, dry and intact. No rash noted. Psychiatric: Mood and  affect are normal. Speech and behavior are normal.  ____________________________________________   LABS (all labs ordered are listed, but only abnormal results are displayed)  Labs Reviewed  CBC WITH DIFFERENTIAL/PLATELET  BRAIN NATRIURETIC PEPTIDE  TROPONIN I  BASIC METABOLIC PANEL    Pertinent labs  results that were available during my care of the patient were reviewed by me and considered in my medical decision making (see chart for details). ____________________________________________  EKG  I personally interpreted any EKGs ordered by me or triage Atrial fibrillation rate 116 no acute ST elevation or depression, LAD noted no acute ischemia ____________________________________________  RADIOLOGY  Pertinent labs & imaging results that were available during my care of the patient were reviewed by me and considered in my medical decision making (see chart for details). If possible, patient and/or family made aware of any abnormal findings.  No results found. ____________________________________________    PROCEDURES  Procedure(s) performed: None  Procedures  Critical Care performed: CRITICAL CARE Performed by: Jeanmarie Plant   Total critical care time: 45 minutes  Critical care time was exclusive of  separately billable procedures and treating other patients.  Critical care was necessary to treat or prevent imminent or life-threatening deterioration.  Critical care was time spent personally by me on the following activities: development of treatment plan with patient and/or surrogate as well as nursing, discussions with consultants, evaluation of patient's response to treatment, examination of patient, obtaining history from patient or surrogate, ordering and performing treatments and interventions, ordering and review of laboratory studies, ordering and review of radiographic studies, pulse oximetry and re-evaluation of patient's condition.   ____________________________________________   INITIAL IMPRESSION / ASSESSMENT AND PLAN / ED COURSE  Pertinent labs & imaging results that were available during my care of the patient were reviewed by me and considered in my medical decision making (see chart for details).  She with chronic recurrent shortness of breath, atrial fibrillation on blood thinners with an IVC filter and a history of PE states that he is having shortness of breath.  He is somewhat tachycardic here, although he is also quite anxious.  He is in atrial fibrillation he states he is compliant with all of his medications.  Given his history of PE is a complaint of shortness of breath, even though he has had recent CT scan that was negative I think it is in his best interest for [place] to check again and we will do so.  Chest x-ray to my read does not show any acute pathology.  On the differential exists also acute coronary syndrome but again low suspicion no chest pain and chronic symptoms but we will send a troponin.  I do not think serial enzymes will be indicated.  In addition, patient has some risk factors for CHF according to his description of his echo which I cannot find and he cannot remember the name of the cardiologist, we will send a BNP.  If his heart rate stays elevated I will  give him medication for but at this time, I think it is some somewhat is mediated by anxiety and we will watch it.  ----------------------------------------- 7:28 PM on 02/21/2019 -----------------------------------------  Heart rate did come down as he relaxed but I am concerned that he is having significant decompensated CHF.  His BNP is more than doubled since we last checked it, and his chest x-ray and his CT both consistent with significant fluid overload/CHF.  He is not on any diuretic that he knows of, I am  getting give him IV Lasix.  I feel that he should be admitted for decompensated heart failure for further cardiology work-up.    ____________________________________________   FINAL CLINICAL IMPRESSION(S) / ED DIAGNOSES  Final diagnoses:  None      This chart was dictated using voice recognition software.  Despite best efforts to proofread,  errors can occur which can change meaning.      Jeanmarie Plant, MD 02/21/19 1716    Jeanmarie Plant, MD 02/21/19 1929

## 2019-02-21 NOTE — ED Notes (Signed)
ED TO INPATIENT HANDOFF REPORT  ED Nurse Name and Phone #:  Terance Hart, RN  [phone]  S Name/Age/Gender Henry Dunn 43 y.o. male Room/Bed: ED35A/ED35A  Code Status   Code Status: Prior  Home/SNF/Other Home Patient oriented to: self, place, time and situation Is this baseline? Yes   Triage Complete: Triage complete  Chief Complaint SOB  Triage Note SOB and nonproductive cough X 1 month. Denies CP.    Allergies No Known Allergies  Level of Care/Admitting Diagnosis ED Disposition    ED Disposition Condition Comment   Admit  Hospital Area: [facility] [[id number]]  Level of Care: Telemetry [5]  Diagnosis: Acute exacerbation of CHF (congestive heart failure) [facility]) [[id number]]  Admitting Physician: Auburn Bilberry [[id number]]  Attending Physician: Auburn Bilberry [[id number]]  Estimated length of stay: past midnight tomorrow  Certification:: I certify this patient will need inpatient services for at least 2 midnights  PT Class (Do Not Modify): Inpatient [101]  PT Acc Code (Do Not Modify): Private [1]       B Medical/Surgery History Past Medical History:  Diagnosis Date  . A-fib (HCC)   . DVT (deep venous thrombosis) (HCC)    a. In setting of dislocated hip, status post IVC filter, previously on Coumadin, noncompliant over the past 12 months  . Hx of blood clots    a. Patient denies any family history of clotting disorder, patient denies prior hypercoagulable workup  . PE (pulmonary thromboembolism) (HCC)    Past Surgical History:  Procedure Laterality Date  . INSERTION OF VENA CAVA FILTER    . PERIPHERAL VASCULAR THROMBECTOMY Right 03/18/2018   Procedure: PERIPHERAL VASCULAR THROMBECTOMY;  Surgeon: Renford Dills, MD;  Location: [facility] INVASIVE CV LAB;  Service: Cardiovascular;  Laterality: Right;     A IV Location/Drains/Wounds Patient Lines/Drains/Airways Status   Active Line/Drains/Airways    Name:   Placement date:   Placement time:    Site:   Days:   Peripheral IV 02/21/19 Right Antecubital   02/21/19    1714    Antecubital   less than 1   Sheath 03/18/18 Right Venous   03/18/18    0910    Venous   340   Sheath 03/18/18 Left Venous;Femoral   03/18/18    0925    Venous;Femoral   340          Intake/Output Last 24 hours  Intake/Output Summary (Last 24 hours) at 02/21/2019 2006 Last data filed at 02/21/2019 1943 Gross per 24 hour  Intake -  Output 300 ml  Net -300 ml    Labs/Imaging Results for orders placed or performed during the hospital encounter of 02/21/19 (from the past 48 hour(s))  CBC with Differential     Status: None   Collection Time: 02/21/19  5:11 PM  Result Value Ref Range   WBC 7.8 4.0 - 10.5 K/uL   RBC 4.83 4.22 - 5.81 MIL/uL   Hemoglobin 14.6 13.0 - 17.0 g/dL   HCT 20.3 55.9 - 74.1 %   MCV 93.0 80.0 - 100.0 fL   MCH 30.2 26.0 - 34.0 pg   MCHC 32.5 30.0 - 36.0 g/dL   RDW 63.8 45.3 - 64.6 %   Platelets 197 150 - 400 K/uL   nRBC 0.0 0.0 - 0.2 %   Neutrophils Relative % 56 %   Neutro Abs 4.3 1.7 - 7.7 K/uL   Lymphocytes Relative 35 %   Lymphs Abs 2.7 0.7 - 4.0 K/uL   Monocytes Relative  7 %   Monocytes Absolute 0.6 0.1 - 1.0 K/uL   Eosinophils Relative 2 %   Eosinophils Absolute 0.1 0.0 - 0.5 K/uL   Basophils Relative 0 %   Basophils Absolute 0.0 0.0 - 0.1 K/uL   Immature Granulocytes 0 %   Abs Immature Granulocytes 0.02 0.00 - 0.07 K/uL    Comment: Performed at [facility], [address] [place], [place] [address]  Brain natriuretic peptide     Status: Abnormal   Collection Time: 02/21/19  5:11 PM  Result Value Ref Range   B Natriuretic Peptide 1,817.0 (H) 0.0 - 100.0 pg/mL    Comment: Performed at [facility], [address] [place], [place] [address]  Troponin I - Once     Status: None   Collection Time: 02/21/19  5:11 PM  Result Value Ref Range   Troponin I <0.03 <0.03 ng/mL    Comment: Performed at [facility], [address]  [place], [place] [address]  Basic metabolic panel     Status: Abnormal   Collection Time: 02/21/19  5:11 PM  Result Value Ref Range   Sodium 133 (L) 135 - 145 mmol/L   Potassium 4.0 3.5 - 5.1 mmol/L   Chloride 104 98 - 111 mmol/L   CO2 20 (L) 22 - 32 mmol/L   Glucose, Bld 125 (H) 70 - 99 mg/dL   BUN 15 6 - 20 mg/dL   Creatinine, Ser 2.24 0.61 - 1.24 mg/dL   Calcium 8.6 (L) 8.9 - 10.3 mg/dL   GFR calc non Af Amer >60 >60 mL/min   GFR calc Af Amer >60 >60 mL/min   Anion gap 9 5 - 15    Comment: Performed at [facility], [address] [place], [place] [address]  Hepatic function panel     Status: None   Collection Time: 02/21/19  7:29 PM  Result Value Ref Range   Total Protein 6.8 6.5 - 8.1 g/dL   Albumin 4.0 3.5 - 5.0 g/dL   AST 25 15 - 41 U/L   ALT 23 0 - 44 U/L   Alkaline Phosphatase 55 38 - 126 U/L   Total Bilirubin 0.8 0.3 - 1.2 mg/dL   Bilirubin, Direct 0.2 0.0 - 0.2 mg/dL   Indirect Bilirubin 0.6 0.3 - 0.9 mg/dL    Comment: Performed at [facility], [address] [place], [place] [address]   Ct Angio Chest Pe W And/or Wo Contrast  Result Date: 02/21/2019 CLINICAL DATA:  43 year old male with history of DVT, atrial fibrillation and PE. Progressive shortness of breath. EXAM: CT ANGIOGRAPHY CHEST WITH CONTRAST TECHNIQUE: Multidetector CT imaging of the chest was performed using the standard protocol during bolus administration of intravenous contrast. Multiplanar CT image reconstructions and MIPs were obtained to evaluate the vascular anatomy. CONTRAST:  8mL OMNIPAQUE IOHEXOL 350 MG/ML SOLN COMPARISON:  Chest CTA 01/13/2019 and earlier. FINDINGS: Cardiovascular: Adequate contrast bolus timing in the pulmonary arterial tree. No focal filling defect identified in the pulmonary arteries to suggest acute pulmonary embolism. Cardiomegaly, stable to mildly progressed since February. No pericardial effusion. No contrast in the aorta today. Calcified coronary artery  atherosclerosis on series 5, image 139. Contrast reflux into the azygos and paravertebral veins. Mediastinum/Nodes: Mildly enlarged mediastinal lymph nodes again noted, with a reactive appearance. No improvement since January. No new mediastinal abnormality. Lungs/Pleura: Small bilateral layering pleural effusions have increased. Major airways remain patent. Stable scarring in the lingula. The 9 millimeter area of  ground-glass opacity at the junction of the right middle and upper lobes has regressed (series 6, image 50 today). Mildly lower lung volumes overall. Mild generalized ground-glass opacity compared to the prior. Upper Abdomen: New small volume ascites about the liver and spleen. New mild to moderate fat stranding around the gallbladder (series 4, image 106). Negative visible pancreas, adrenal glands, bowel. Stable visible renal upper poles. Musculoskeletal: No acute osseous abnormality identified. Review of the MIP images confirms the above findings. IMPRESSION: 1. Negative for acute pulmonary embolus. 2. Cardiomegaly which may have mildly progressed since February. Persistent reactive appearing mediastinal lymph nodes. Increased small layering pleural effusions. Subtle generalized ground-glass opacity. New small volume ascites in the upper abdomen. This constellation favors interval volume overload, mild pulmonary edema, mild congestive heart failure. 3. New mild to moderate fat stranding about the gallbladder is nonspecific but favored due to #2 in the absence of acute right upper quadrant pain. Follow-up Right Upper Quadrant Ultrasound may be valuable in this clinical setting. 4. Calcified coronary artery atherosclerosis. Electronically Signed   By: Odessa Fleming M.D.   On: 02/21/2019 19:11   Dg Chest Portable 1 View  Result Date: 02/21/2019 CLINICAL DATA:  Nonproductive cough, shortness of breath. EXAM: PORTABLE CHEST 1 VIEW COMPARISON:  CT scan and radiographs of January 13, 2019. FINDINGS: Stable  cardiomegaly. No pneumothorax or pleural effusion is noted. Both lungs are clear. The visualized skeletal structures are unremarkable. IMPRESSION: No active disease. Electronically Signed   By: Lupita Raider, M.D.   On: 02/21/2019 17:14    Pending Labs [employer] (From admission, onward)    Start     Ordered   Signed and Held  HIV antibody (Routine Testing)  Once,   R     Signed and Held   Signed and Held  Basic metabolic panel  Daily,   R     Signed and Held          Vitals/Pain Today's Vitals   02/21/19 1643 02/21/19 1906 02/21/19 1945  BP: (!) 135/112  106/74  Pulse: (!) 127 (!) 115 (!) 104  Resp: (!) 24 20 18   Temp: (!) 97.4 F (36.3 C)    TempSrc: Oral    SpO2: 99% 96% 99%  Weight: 95.3 kg    Height: 5\' 9"  (1.753 m)    PainSc: 0-No pain      Isolation Precautions No active isolations  Medications Medications  aspirin EC tablet 81 mg (has no administration in time range)  metoprolol tartrate (LOPRESSOR) tablet 25 mg (has no administration in time range)  iohexol (OMNIPAQUE) 350 MG/ML injection 75 mL (75 mLs Intravenous Contrast Given 02/21/19 1759)  furosemide (LASIX) injection 60 mg (60 mg Intravenous Given 02/21/19 1935)    Mobility walks Low fall risk   Focused Assessments Cardiac Assessment Handoff:  Cardiac Rhythm: Atrial fibrillation Lab Results  Component Value Date   CKTOTAL 29 (L) 02/17/2017   TROPONINI <0.03 02/21/2019   No results found for: DDIMER Does the Patient currently have chest pain? No  , Pulmonary Assessment Handoff:  Lung sounds:   O2 Device: Room Air        R Recommendations: See Admitting Provider Note  Report given to:   Additional Notes: Given 60 mg of IV lasix

## 2019-02-21 NOTE — ED Notes (Signed)
Pt wearing mask.  Pt hx of blood clots, "heart problems".

## 2019-02-21 NOTE — ED Triage Notes (Signed)
SOB and nonproductive cough X 1 month. Denies CP.

## 2019-02-21 NOTE — ED Notes (Signed)
Admitting provider at bedside.

## 2019-02-22 ENCOUNTER — Inpatient Hospital Stay
Admit: 2019-02-22 | Discharge: 2019-02-22 | Disposition: A | Discharge status: Home / Self Care | Payer: Medicaid Other | Attending: Internal Medicine | Admitting: Internal Medicine

## 2019-02-22 LAB — BASIC METABOLIC PANEL
Anion gap: 9 (ref 5–15)
BUN: 19 mg/dL (ref 6–20)
CO2: 23 mmol/L (ref 22–32)
Calcium: 8.5 mg/dL — ABNORMAL LOW (ref 8.9–10.3)
Chloride: 105 mmol/L (ref 98–111)
Creatinine, Ser: 1.12 mg/dL (ref 0.61–1.24)
GFR calc Af Amer: >60 mL/min (ref >60)
GFR calc non Af Amer: >60 mL/min (ref >60)
Glucose, Bld: 95 mg/dL (ref 70–99)
Potassium: 3.5 mmol/L (ref 3.5–5.1)
Sodium: 137 mmol/L (ref 135–145)

## 2019-02-22 LAB — ECHOCARDIOGRAM COMPLETE
Height: 69 in
Weight: 3331.2 oz

## 2019-02-22 NOTE — Consult Note (Signed)
[facility] Cardiology  CARDIOLOGY CONSULT NOTE  Patient ID: Henry Dunn MRN: [id number] DOB/AGE: November 09, 1976 43 y.o.  Admit date: 02/21/2019 Referring Physician Auburn Bilberry, MD Primary Physician [facility] Primary Cardiologist [facility] cardiology Reason for Consultation Shortness of breath   HPI:  Henry Dunn is a 43 y.o. with a past medical history of atrial fibrillation, DVT with IVC filter in place, and PE, anticoagulated on Xarelto, who presented to the ED with a one month history of progressive shortness of breath. Initially his shortness of breath was exertional, however, it continued to progress and yesterday he was feeling short of breath with even minimal activity including adjusting his position in a chair or trying to put on his clothes in the morning. He denies any associated chest pain, palpitations, lightheadedness, or syncope. He had no significant change to his chronic leg swelling he experienced as a result of his DVT. Since admission to the hospital he has received a dose of lasix IV to 40 mg and now feeling less short of breath.   He reports seeing a cardiologist through [facility] ~1 month ago. He was told that his heart was working as it should and he was started on a new medication. There are no available records of his ECHO results of from his cardiology visit.   Review of systems complete and found to be negative unless listed above   Past Medical History:  Diagnosis Date  . A-fib (HCC)   . DVT (deep venous thrombosis) (HCC)    a. In setting of dislocated hip, status post IVC filter, previously on Coumadin, noncompliant over the past 12 months  . Hx of blood clots    a. Patient denies any family history of clotting disorder, patient denies prior hypercoagulable workup  . PE (pulmonary thromboembolism) (HCC)     Past Surgical History:  Procedure Laterality Date  . INSERTION OF VENA CAVA FILTER    . PERIPHERAL VASCULAR THROMBECTOMY Right 03/18/2018   Procedure: PERIPHERAL  VASCULAR THROMBECTOMY;  Surgeon: Renford Dills, MD;  Location: [facility] INVASIVE CV LAB;  Service: Cardiovascular;  Laterality: Right;    Medications Prior to Admission  Medication Sig Dispense Refill Last Dose  . losartan (COZAAR) 25 MG tablet Take 25 mg by mouth daily.   Past Week at Unknown time  . metoprolol succinate (TOPROL-XL) 25 MG 24 hr tablet Take 1 tablet (25 mg total) by mouth daily. 30 tablet 1 Past Week at Unknown time  . metoprolol tartrate (LOPRESSOR) 25 MG tablet Take 25 mg by mouth daily as needed (tachycardia).    Unknown at PRN  . XARELTO 20 MG TABS tablet TAKE 1 TABLET BY MOUTH ONCE DAILY (Patient taking differently: Take 20 mg by mouth daily. ) 90 tablet 0 Past Week at Unknown time  . acetaminophen (TYLENOL) 325 MG tablet Take 2 tablets (650 mg total) by mouth every 6 (six) hours as needed for mild pain (or Fever >/= 101). (Patient not taking: Reported on 02/21/2019) 30 tablet 0 Completed Course at Unknown time  . aspirin EC 81 MG EC tablet Take 1 tablet (81 mg total) by mouth daily. 30 tablet 0 Taking  . oxyCODONE-acetaminophen (PERCOCET) 5-325 MG tablet Take 1-2 tablets by mouth every 6 (six) hours as needed for severe pain. (Patient not taking: Reported on 02/21/2019) 40 tablet 0 Completed Course at Unknown time  . Rivaroxaban (XARELTO) 15 MG TABS tablet Take 1 tablet (15 mg total) by mouth 2 (two) times daily. (Patient not taking: Reported on 02/21/2019) 42  tablet 0 Completed Course at Unknown time  . rivaroxaban (XARELTO) 20 MG TABS tablet Take 1 tablet (20 mg total) by mouth daily with supper. This is to begin after he has finished the 15 mg bid Prescription  For 21 days (Patient not taking: Reported on 02/21/2019) 30 tablet 11 Completed Course at Unknown time   Social History   Socioeconomic History  . Marital status: Single    Spouse name: Not on file  . Number of children: Not on file  . Years of education: Not on file  . Highest education level: Not on file   Occupational History  . Not on file  Social Needs  . Financial resource strain: Not on file  . Food insecurity:    Worry: Not on file    Inability: Not on file  . Transportation needs:    Medical: Not on file    Non-medical: Not on file  Tobacco Use  . Smoking status: Current Every Day Smoker    Packs/day: 0.50    Types: Cigarettes  . Smokeless tobacco: Never Used  Substance and Sexual Activity  . Alcohol use: Yes    Alcohol/week: 12.0 standard drinks    Types: 12 Cans of beer per week  . Drug use: Not Currently  . Sexual activity: Not on file  Lifestyle  . Physical activity:    Days per week: Not on file    Minutes per session: Not on file  . Stress: Not on file  Relationships  . Social connections:    Talks on phone: Not on file    Gets together: Not on file    Attends religious service: Not on file    Active member of club or organization: Not on file    Attends meetings of clubs or organizations: Not on file    Relationship status: Not on file  . Intimate partner violence:    Fear of current or ex partner: Not on file    Emotionally abused: Not on file    Physically abused: Not on file    Forced sexual activity: Not on file  Other Topics Concern  . Not on file  Social History Narrative  . Not on file    Family History  Problem Relation Age of Onset  . Diabetes Neg Hx   . Hypertension Neg Hx      Review of systems complete and found to be negative unless listed above    PHYSICAL EXAM  General: Well developed, well nourished, in no acute distress HEENT:  Normocephalic and atramatic Neck:  No JVD.  Lungs: Clear bilaterally to auscultation and percussion. Heart: Irregularly irregular rhythm, Rate in the 110s. Normal S1 and S2 without gallops or murmurs.  Extremities: No clubbing, cyanosis or edema.   Neuro: Alert and oriented X 3. Psych:  Good affect, responds appropriately  Labs:   Lab Results  Component Value Date   WBC 7.8 02/21/2019   HGB  14.6 02/21/2019   HCT 44.9 02/21/2019   MCV 93.0 02/21/2019   PLT 197 02/21/2019    Recent Labs  Lab 02/21/19 1929 02/22/19 0427  NA  --  137  K  --  3.5  CL  --  105  CO2  --  23  BUN  --  19  CREATININE  --  1.12  CALCIUM  --  8.5*  PROT 6.8  --   BILITOT 0.8  --   ALKPHOS 55  --   ALT 23  --  AST 25  --   GLUCOSE  --  95   Lab Results  Component Value Date   CKTOTAL 29 (L) 02/17/2017   TROPONINI <0.03 02/21/2019   No results found for: CHOL No results found for: HDL No results found for: LDLCALC No results found for: TRIG No results found for: CHOLHDL No results found for: LDLDIRECT    Radiology: Ct Angio Chest Pe W And/or Wo Contrast  Result Date: 02/21/2019 CLINICAL DATA:  43 year old male with history of DVT, atrial fibrillation and PE. Progressive shortness of breath. EXAM: CT ANGIOGRAPHY CHEST WITH CONTRAST TECHNIQUE: Multidetector CT imaging of the chest was performed using the standard protocol during bolus administration of intravenous contrast. Multiplanar CT image reconstructions and MIPs were obtained to evaluate the vascular anatomy. CONTRAST:  75mL OMNIPAQUE IOHEXOL 350 MG/ML SOLN COMPARISON:  Chest CTA 01/13/2019 and earlier. FINDINGS: Cardiovascular: Adequate contrast bolus timing in the pulmonary arterial tree. No focal filling defect identified in the pulmonary arteries to suggest acute pulmonary embolism. Cardiomegaly, stable to mildly progressed since February. No pericardial effusion. No contrast in the aorta today. Calcified coronary artery atherosclerosis on series 5, image 139. Contrast reflux into the azygos and paravertebral veins. Mediastinum/Nodes: Mildly enlarged mediastinal lymph nodes again noted, with a reactive appearance. No improvement since January. No new mediastinal abnormality. Lungs/Pleura: Small bilateral layering pleural effusions have increased. Major airways remain patent. Stable scarring in the lingula. The 9 millimeter area of  ground-glass opacity at the junction of the right middle and upper lobes has regressed (series 6, image 50 today). Mildly lower lung volumes overall. Mild generalized ground-glass opacity compared to the prior. Upper Abdomen: New small volume ascites about the liver and spleen. New mild to moderate fat stranding around the gallbladder (series 4, image 106). Negative visible pancreas, adrenal glands, bowel. Stable visible renal upper poles. Musculoskeletal: No acute osseous abnormality identified. Review of the MIP images confirms the above findings. IMPRESSION: 1. Negative for acute pulmonary embolus. 2. Cardiomegaly which may have mildly progressed since February. Persistent reactive appearing mediastinal lymph nodes. Increased small layering pleural effusions. Subtle generalized ground-glass opacity. New small volume ascites in the upper abdomen. This constellation favors interval volume overload, mild pulmonary edema, mild congestive heart failure. 3. New mild to moderate fat stranding about the gallbladder is nonspecific but favored due to #2 in the absence of acute right upper quadrant pain. Follow-up Right Upper Quadrant Ultrasound may be valuable in this clinical setting. 4. Calcified coronary artery atherosclerosis. Electronically Signed   By: Odessa Fleming M.D.   On: 02/21/2019 19:11   Dg Chest Portable 1 View  Result Date: 02/21/2019 CLINICAL DATA:  Nonproductive cough, shortness of breath. EXAM: PORTABLE CHEST 1 VIEW COMPARISON:  CT scan and radiographs of January 13, 2019. FINDINGS: Stable cardiomegaly. No pneumothorax or pleural effusion is noted. Both lungs are clear. The visualized skeletal structures are unremarkable. IMPRESSION: No active disease. Electronically Signed   By: Lupita Raider, M.D.   On: 02/21/2019 17:14    ASSESSMENT AND PLAN:   Henry Dunn is a 43 year old male, anticoagulated with Xarelto given history of DVT/PE and atrial fibrillation, presenting with one month of progressive  shortness of breath. Patient does not appear clinically fluid overloaded at this time. CXR in the ER showed no obvious evidence of pleural effusion. Given no chest pain and troponin negative x2, no concern for ACS currently. CTA showed no evidence of obvious PE. Symptoms may be secondary to acute heart failure. However, appears  clinically well now.   1. Obtain ECHO to assess for LV dysfunction 2. Increase metoprolol to 50 mg for improved heart rate control in setting of atrial fibrillation 3. Discharge home on PO lasix for fluid management  4. Close follow up with outpatient cardiologist for further workup of his likely cardiomyopathy  5. No indication for other cardiac diagnostics or interventions indicated at this time.  Signed: Harrell Gave PA-C 02/22/2019, 9:17 AM

## 2019-02-22 NOTE — Progress Notes (Signed)
*  PRELIMINARY RESULTS* Echocardiogram 2D Echocardiogram has been performed.  Joanette Gula [place] Spark 02/22/2019, 10:28 AM

## 2019-02-22 NOTE — Progress Notes (Signed)
Pt sleeping.  Education deferred at this time.

## 2019-02-22 NOTE — Progress Notes (Signed)
[facility] [place] [facility]   PATIENT NAME: Henry Dunn    MR#:  [id number]  DATE OF BIRTH:  [birth year]  SUBJECTIVE:  CHIEF COMPLAINT:   Chief Complaint  Patient presents with   Shortness of Breath   Cough   Shortness of breath gradually improving.  2D echocardiogram being done at bedside.  No fevers.  No chest pain.  REVIEW OF SYSTEMS:  Review of Systems  Constitutional: Negative for chills and fever.  HENT: Negative for hearing loss and tinnitus.   Eyes: Negative for blurred vision and double vision.  Respiratory: Negative for cough.        Shortness of breath improving  Cardiovascular: Negative for chest pain and orthopnea.  Gastrointestinal: Negative for heartburn and nausea.  Genitourinary: Negative for dysuria and urgency.  Musculoskeletal: Negative for myalgias and neck pain.  Skin: Negative for itching and rash.  Neurological: Negative for dizziness and headaches.  Psychiatric/Behavioral: Negative for depression and hallucinations.    DRUG ALLERGIES:  No Known Allergies VITALS:  Blood pressure 108/88, pulse (!) 113, temperature 98.4 F (36.9 C), temperature source Oral, resp. rate 20, height 5\' 9"  (1.753 m), weight 94.4 kg, SpO2 92 %. PHYSICAL EXAMINATION:   Physical Exam  Constitutional: He is oriented to person, place, and time. He appears well-developed.  HENT:  Head: Normocephalic.  Right Ear: External ear normal.  Mouth/Throat: Oropharynx is clear and moist.  Eyes: Pupils are equal, round, and reactive to light. Conjunctivae are normal. Right eye exhibits no discharge.  Neck: Normal range of motion. Neck supple. No thyromegaly present.  Cardiovascular:  Irregularly irregular  Respiratory: Effort normal. He has rales.  GI: Soft. Bowel sounds are normal.  Musculoskeletal: Normal range of motion.        General: No edema.  Neurological: He is alert and oriented to person, place, and time. No cranial nerve deficit.  Skin:  Skin is warm. He is not diaphoretic. No erythema.   LABORATORY PANEL:  Male CBC Recent Labs  Lab 02/21/19 1711  WBC 7.8  HGB 14.6  HCT 44.9  PLT 197   ------------------------------------------------------------4mGus PumLe ascular: Adequate contrast bolus timing in the pulmonary arterial tree. No focal filling defect identified in the pulmonary arteries to suggest acute pulmonary embolism. Cardiomegaly, stable to mildly progressed since February. No pericardial effusion. No contrast in the aorta today. Calcified coronary artery atherosclerosis on series 5, image 139. Contrast reflux into the azygos and paravertebral veins. Mediastinum/Nodes: Mildly enlarged mediastinal lymph nodes again noted, with  a reactive appearance. No improvement since January. No new mediastinal abnormality. Lungs/Pleura: Small bilateral layering pleural effusions have increased. Major airways remain patent. Stable scarring in the lingula. The 9 millimeter area of ground-glass opacity at the junction of the right middle and upper lobes has regressed  (series 6, image 50 today). Mildly lower lung volumes overall. Mild generalized ground-glass opacity compared to the prior. Upper Abdomen: New small volume ascites about the liver and spleen. New mild to moderate fat stranding around the gallbladder (series 4, image 106). Negative visible pancreas, adrenal glands, bowel. Stable visible renal upper poles. Musculoskeletal: No acute osseous abnormality identified. Review of the MIP images confirms the above findings. IMPRESSION: 1. Negative for acute pulmonary embolus. 2. Cardiomegaly which may have mildly progressed since February. Persistent reactive appearing mediastinal lymph nodes. Increased small layering pleural effusions. Subtle generalized ground-glass opacity. New small volume ascites in the upper abdomen. This constellation favors interval volume overload, mild pulmonary edema, mild congestive heart failure. 3. New mild to moderate fat stranding about the gallbladder is nonspecific but favored due to #2 in the absence of acute right upper quadrant pain. Follow-up Right Upper Quadrant Ultrasound may be valuable in this clinical setting. 4. Calcified coronary artery atherosclerosis. Electronically Signed   By: Odessa Fleming M.D.   On: 02/21/2019 19:11   Dg Chest Portable 1 View  Result Date: 02/21/2019 CLINICAL DATA:  Nonproductive cough, shortness of breath. EXAM: PORTABLE CHEST 1 VIEW COMPARISON:  CT scan and radiographs of January 13, 2019. FINDINGS: Stable cardiomegaly. No pneumothorax or pleural effusion is noted. Both lungs are clear. The visualized skeletal structures are unremarkable. IMPRESSION: No active disease. Electronically Signed   By: Lupita Raider, M.D.   On: 02/21/2019 17:14   ASSESSMENT AND PLAN:   Patient is 43 year old presenting with shortness of breath  1.  Acute systolic CHF exacerbation.  Being diuresed with IV Lasix.  Continue ACE inhibitor and metoprolol.  2D echocardiogram done with ejection fraction of 20 to 25%.  Cavity  size was moderate to severely dilated patient seen by cardiologist.  Appreciate input.  Increased metoprolol to 50 mg for improved heart rate control in the setting of atrial fibrillation.  Plan is to follow-up with cardiologist post discharge from the hospital.  2.  Atrial fibrillation continue metoprolol Continue Xarelto  3.  History of pulmonary embolism, DVT continue Xarelto  4.  Nicotine abuse smoking cessation provided 4 minutes spent strongly recommend he stop smoking  DVT prophylaxis; patient already on Xarelto  All the records are reviewed and case discussed with Care Management/Social Worker. Management plans discussed with the patient, family and they are in agreement.  CODE STATUS: Full Code  TOTAL TIME TAKING CARE OF THIS PATIENT: 37 minutes.   More than 50% of the time was spent in counseling/coordination of care: YES  POSSIBLE D/C IN 1-2 DAYS, DEPENDING ON CLINICAL CONDITION.   Jacyln Carmer M.D on 02/22/2019 at 1:56 PM  Between 7am to 6pm - Pager - [phone]  After 6pm go to [email or web address] - Social research officer, government  [facility]  Office  [phone]  CC: Primary care physician; Patient, No Pcp Per  Note: This dictation was prepared with Dragon dictation along with smaller phrase technology. Any transcriptional errors that result from this process are unintentional.

## 2019-02-22 NOTE — TOC Initial Note (Signed)
Transition of Care [facility]) - Initial/Assessment Note    Patient Details  Name: Henry Dunn MRN: [id number] Date of Birth: 1975/12/15  [facility]) CM/SW Contact:    Sherren Kerns, RN Phone Number: 02/22/2019, 12:14 PM  Clinical Narrative:       Patient is from home with girlfriend and is independent in all adl's.   He is current with [facility] for PCP.  He takes Xarelto for history of blood clots.  He gets this medication free through Xarelto as he filled out an application.    He obtains medications at [facility].  Will monitor discharge medications and make referral to Medication Management.  He has used them in the past but did not follow through and provided required documents.  He is currently in the appeal phase of applying for disability.  He does not have a scale.  Will provide a scale for discharge as patient does not have an income .  Has a heart failure clinic appointment.  HF booklet at bedside.   Currently on room air.  Will continue to follow as patient progresses and assist with discharge needs.        Expected Discharge Plan: Home/Self Care Barriers to Discharge: Continued Medical Work up   Patient Goals and CMS Choice        Expected Discharge Plan and Services Expected Discharge Plan: Home/Self Care   Discharge Planning Services: HF Clinic, CM Consult, Medication Assistance, Indigent Health Clinic   Living arrangements for the past 2 months: Single Family Home                          Prior Living Arrangements/Services Living arrangements for the past 2 months: Single Family Home Lives with:: Significant Other Patient language and need for interpreter reviewed:: No Do you feel safe going back to the place where you live?: Yes            Criminal Activity/Legal Involvement Pertinent to Current Situation/Hospitalization: No - Comment as needed  Activities of Daily Living Home Assistive Devices/Equipment: None ADL Screening  (condition at time of admission) Patient's cognitive ability adequate to safely complete daily activities?: Yes Is the patient deaf or have difficulty hearing?: No Does the patient have difficulty seeing, even when wearing glasses/contacts?: No Does the patient have difficulty concentrating, remembering, or making decisions?: No Patient able to express need for assistance with ADLs?: Yes Does the patient have difficulty dressing or bathing?: No Independently performs ADLs?: Yes (appropriate for developmental age) Does the patient have difficulty walking or climbing stairs?: No Weakness of Legs: None Weakness of Arms/Hands: None  Permission Sought/Granted Permission sought to share information with : Facility Industrial/product designer granted to share information with : Yes, Verbal Permission Granted  Share Information with NAME: [facility]           Emotional Assessment Appearance:: Appears stated age, Well-Groomed Attitude/Demeanor/Rapport: Gracious Affect (typically observed): Accepting Orientation: : Oriented to Self, Oriented to Place, Oriented to  Time, Oriented to Situation Alcohol / Substance Use: Not Applicable    Admission diagnosis:  Shortness of breath [R06.02] Dyspnea, unspecified type [R06.00] Acute congestive heart failure, unspecified heart failure type [facility]) [I50.9] Patient Active Problem List   Diagnosis Date Noted  . Acute exacerbation of CHF (congestive heart failure) (HCC) 02/21/2019  . Lymphedema 11/07/2018  . 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 Leg pain, left 06/21/2018  . Cellulitis and abscess of leg   .  New onset atrial flutter (HCC) 02/18/2017  . Chronic deep vein thrombosis (DVT) (HCC) 02/18/2017  . Tobacco abuse 02/18/2017  . DVT (deep venous thrombosis) (HCC) 02/18/2017  . Shortness of breath 02/17/2017   PCP:  Patient, No Pcp Per Pharmacy:   [facility] [address] [place], [place] - [address] [place] [address] Phone: [phone] Fax: [phone]  [id number] [place], [place] - [address] AT [facility] [address] [place] [address] [place] [address] Phone: [phone] Fax: [phone]     Social Determinants of Health (SDOH) Interventions    Readmission Risk Interventions No flowsheet data found.

## 2019-02-23 LAB — BASIC METABOLIC PANEL
Anion gap: 12 (ref 5–15)
BUN: 18 mg/dL (ref 6–20)
CO2: 26 mmol/L (ref 22–32)
Calcium: 9.1 mg/dL (ref 8.9–10.3)
Chloride: 101 mmol/L (ref 98–111)
Creatinine, Ser: 1.16 mg/dL (ref 0.61–1.24)
GFR calc Af Amer: >60 mL/min (ref >60)
GFR calc non Af Amer: >60 mL/min (ref >60)
Glucose, Bld: 101 mg/dL — ABNORMAL HIGH (ref 70–99)
Potassium: 3.7 mmol/L (ref 3.5–5.1)
Sodium: 139 mmol/L (ref 135–145)

## 2019-02-23 LAB — HIV ANTIBODY (ROUTINE TESTING W REFLEX): HIV Screen 4th Generation wRfx: NONREACTIVE

## 2019-02-23 LAB — MAGNESIUM: Magnesium: 1.9 mg/dL (ref 1.7–2.4)

## 2019-02-23 MED ORDER — METOPROLOL SUCCINATE ER 25 MG PO TB24
25.0000 mg | ORAL_TABLET | Freq: Once | ORAL | Status: AC
  Administered 2019-02-23: 25 mg via ORAL
  Filled 2019-02-23: qty 1

## 2019-02-23 MED ORDER — METOPROLOL SUCCINATE ER 25 MG PO TB24: 50.0000 mg | ORAL_TABLET | Freq: Every day | ORAL | 0 refills | Status: DC

## 2019-02-23 MED ORDER — FUROSEMIDE 40 MG PO TABS: 40.0000 mg | ORAL_TABLET | Freq: Every day | ORAL | 0 refills | Status: DC

## 2019-02-23 MED ORDER — METOPROLOL SUCCINATE ER 25 MG PO TB24
25.0000 mg | ORAL_TABLET | Freq: Every day | ORAL | Status: DC
  Administered 2019-02-23: 25 mg via ORAL
  Filled 2019-02-23: qty 1

## 2019-02-23 MED ORDER — SPIRONOLACTONE 25 MG PO TABS: 12.5000 mg | ORAL_TABLET | Freq: Every day | ORAL | 0 refills | Status: DC

## 2019-02-23 NOTE — TOC Progression Note (Signed)
Transition of Care [facility]) - Progression Note    Patient Details  Name: Henry Dunn MRN: [id number] Date of Birth: 12/18/1975  Transition of Care [facility]) CM/SW Contact  Sherren Kerns, RN Phone Number: 02/23/2019, 11:56 AM  Clinical Narrative:   Patient states he can afford all medications at the retail pharmacy.  Patient declines Medication Management Clinic.      Expected Discharge Plan: Home/Self Care Barriers to Discharge: Continued Medical Work up  Expected Discharge Plan and Services Expected Discharge Plan: Home/Self Care   Discharge Planning Services: HF Clinic, CM Consult, Medication Assistance, Indigent Health Clinic   Living arrangements for the past 2 months: Single Family Home                           Social Determinants of Health (SDOH) Interventions    Readmission Risk Interventions No flowsheet data found.

## 2019-02-23 NOTE — Progress Notes (Signed)
[facility] Cardiology  SUBJECTIVE:  Mr. Henry Dunn reports feeling well this morning. He has been able to walk to and from the bathroom without any shortness of breath. He has no shortness of breath at rest. He continues to deny any chest pain, palpitations, or lightheadedness.   His ECHO completed yesterday showed significant reduced EF to 20%. Discussed care moving forward, he will need close outpatient cardiology follow up. He has an appointment with his [facility] cardiologist in one week.   Vitals:   02/22/19 1555 02/22/19 1710 02/22/19 2011 02/23/19 0418  BP: 93/66 94/69 93/81  105/81  Pulse: (!) 118 85 84 (!) 59  Resp: 20 16    Temp: 97.8 F (36.6 C)  98 F (36.7 C) 98.4 F (36.9 C)  TempSrc: Oral  Oral Oral  SpO2: 96% 96% 94% 99%  Weight:    92.7 kg  Height:         Intake/Output Summary (Last 24 hours) at 02/23/2019 0955 Last data filed at 02/23/2019 0422 Gross per 24 hour  Intake -  Output 2775 ml  Net -2775 ml    PHYSICAL EXAM  General: Well developed, well nourished, in no acute distress HEENT:  Normocephalic and atramatic Neck:  No JVD.  Lungs: Clear bilaterally to auscultation and percussion. Heart: Irregularly irregular rhythm. Borderline tachycardia. Normal S1 and S2 without gallops or murmurs.  Extremities: No clubbing, cyanosis or edema.   Neuro: Alert and oriented X 3. Psych:  Good affect, responds appropriately  LABS: Basic Metabolic Panel: Recent Labs    02/22/19 0427 02/23/19 0403  NA 137 139  K 3.5 3.7  CL 105 101  CO2 23 26  GLUCOSE 95 101*  BUN 19 18  CREATININE 1.12 1.16  CALCIUM 8.5* 9.1  MG  --  1.9   Liver Function Tests: Recent Labs    02/21/19 1929  AST 25  ALT 23  ALKPHOS 55  BILITOT 0.8  PROT 6.8  ALBUMIN 4.0   No results for input(s): LIPASE, AMYLASE in the last 72 hours. CBC: Recent Labs    02/21/19 1711  WBC 7.8  NEUTROABS 4.3  HGB 14.6  HCT 44.9  MCV 93.0  PLT 197   Cardiac Enzymes: Recent Labs     02/21/19 1711  TROPONINI <0.03   BNP: Invalid input(s): POCBNP D-Dimer: No results for input(s): DDIMER in the last 72 hours. Hemoglobin A1C: No results for input(s): HGBA1C in the last 72 hours. Fasting Lipid Panel: No results for input(s): CHOL, HDL, LDLCALC, TRIG, CHOLHDL, LDLDIRECT in the last 72 hours. Thyroid Function Tests: No results for input(s): TSH, T4TOTAL, T3FREE, THYROIDAB in the last 72 hours.  Invalid input(s): FREET3 Anemia Panel: No results for input(s): VITAMINB12, FOLATE, FERRITIN, TIBC, IRON, RETICCTPCT in the last 72 hours.  Ct Angio Chest Pe W And/or Wo Contrast  Result Date: 02/21/2019 CLINICAL DATA:  43 year old male with history of DVT, atrial fibrillation and PE. Progressive shortness of breath. EXAM: CT ANGIOGRAPHY CHEST WITH CONTRAST TECHNIQUE: Multidetector CT imaging of the chest was performed using the standard protocol during bolus administration of intravenous contrast. Multiplanar CT image reconstructions and MIPs were obtained to evaluate the vascular anatomy. CONTRAST:  24mL OMNIPAQUE IOHEXOL 350 MG/ML SOLN COMPARISON:  Chest CTA 01/13/2019 and earlier. FINDINGS: Cardiovascular: Adequate contrast bolus timing in the pulmonary arterial tree. No focal filling defect identified in the pulmonary arteries to suggest acute pulmonary embolism. Cardiomegaly, stable to mildly progressed since February. No pericardial effusion. No contrast in the aorta today. Calcified  coronary artery atherosclerosis on series 5, image 139. Contrast reflux into the azygos and paravertebral veins. Mediastinum/Nodes: Mildly enlarged mediastinal lymph nodes again noted, with a reactive appearance. No improvement since January. No new mediastinal abnormality. Lungs/Pleura: Small bilateral layering pleural effusions have increased. Major airways remain patent. Stable scarring in the lingula. The 9 millimeter area of ground-glass opacity at the junction of the right middle and upper lobes  has regressed (series 6, image 50 today). Mildly lower lung volumes overall. Mild generalized ground-glass opacity compared to the prior. Upper Abdomen: New small volume ascites about the liver and spleen. New mild to moderate fat stranding around the gallbladder (series 4, image 106). Negative visible pancreas, adrenal glands, bowel. Stable visible renal upper poles. Musculoskeletal: No acute osseous abnormality identified. Review of the MIP images confirms the above findings. IMPRESSION: 1. Negative for acute pulmonary embolus. 2. Cardiomegaly which may have mildly progressed since February. Persistent reactive appearing mediastinal lymph nodes. Increased small layering pleural effusions. Subtle generalized ground-glass opacity. New small volume ascites in the upper abdomen. This constellation favors interval volume overload, mild pulmonary edema, mild congestive heart failure. 3. New mild to moderate fat stranding about the gallbladder is nonspecific but favored due to #2 in the absence of acute right upper quadrant pain. Follow-up Right Upper Quadrant Ultrasound may be valuable in this clinical setting. 4. Calcified coronary artery atherosclerosis. Electronically Signed   By: Odessa Fleming M.D.   On: 02/21/2019 19:11   Dg Chest Portable 1 View  Result Date: 02/21/2019 CLINICAL DATA:  Nonproductive cough, shortness of breath. EXAM: PORTABLE CHEST 1 VIEW COMPARISON:  CT scan and radiographs of January 13, 2019. FINDINGS: Stable cardiomegaly. No pneumothorax or pleural effusion is noted. Both lungs are clear. The visualized skeletal structures are unremarkable. IMPRESSION: No active disease. Electronically Signed   By: Lupita Raider, M.D.   On: 02/21/2019 17:14    ASSESSMENT AND PLAN:  Mr. Henry Dunn is a 43 year old male, anticoagulated with Xarelto given history of DVT/PE and atrial fibrillation, presented to the ED with one month of progressive shortness of breath. ECHO completed yesterday with evidence of  significantly reduced EF to 20%.Pleural effusion seen on CT and significantly elevated BNP consistent with acute heart failure exacerbation. Has received IV lasix with significant improvement of his symptoms. He appears clinically well now. Troponin negative x2, no concern for ACS currently. CTA showed no evidence of obvious PE. Continue with guideline directed medical management of his systolic heart failure with ACE/ARB, beta blocker, loop diuretic, aldosterone antagonist.   Active Problems:   Acute exacerbation of CHF (congestive heart failure) (HCC)   1. Heart rate still borderline tachycardic. Recommend increase in metoprolol dose to 50 mg for improved heart rate control in setting of atrial fibrillation. Ok to given metoprolol if systolic BP <100 as long as patient is asymptomatic.  3. Recommend discharge home on metoprolol succinate 50 mg daily, losartan 25 mg daily, lasix 40 mg daily, and spironolactone 12.5 mg daily for management of his systolic heart failure.  4. Continue Xarelto for Atrial fibrillation and history of DVT/PE 5. Close follow up with outpatient cardiologist 6. No indication for other cardiac diagnostics or interventions indicated at this time. Patient optimized for discharge from a cardiac standpoint.   Harrell Gave, PA-C 02/23/2019 9:55 AM

## 2019-02-23 NOTE — Discharge Planning (Signed)
Patient IV and tele removed.  RN assessment and VS revealed stability for DC to home.  Discharge papers given, explained and educated.  Informed of suggested FU appt and appt set for PCP (who will refer to cardio). Patient indicated "no pain" at discharge.  Gave printed scripts to take to pharm.  Once ready, will be wheeled to front (medical mall) and family transporting home via car.  Waiting on ride to arrive.

## 2019-02-23 NOTE — Plan of Care (Signed)
Nutrition Education Note  RD consulted for nutrition education regarding CHF.  RD provided "Low Sodium Nutrition Therapy" handout from the Academy of [employer] and Dietetics. Reviewed patient's dietary recall. Provided examples on ways to decrease sodium intake in diet. Discouraged intake of processed foods and use of salt shaker. Encouraged fresh fruits and vegetables as well as whole grain sources of carbohydrates to maximize fiber intake.   RD discussed why it is important for patient to adhere to diet recommendations, and emphasized the role of fluids, foods to avoid, and importance of weighing self daily. Teach back method used.  Expect fair compliance.  Body mass index is 30.17 kg/m. Pt meets criteria for overweight based on current BMI.  Current diet order is HH patient is consuming approximately 100% of meals at this time. Labs and medications reviewed. No further nutrition interventions warranted at this time. RD contact information provided. If additional nutrition issues arise, please re-consult RD.   Betsey Holiday MS, RD, LDN Pager #- [phone] Office#- [phone] After Hours Pager: [phone]

## 2019-02-23 NOTE — Plan of Care (Signed)
Dr. Truitt Merle of mews score of 4 D/T high HR and low BP.  Ordered 1x/dose metoprolol 25mg  PO. Medication given.

## 2019-02-23 NOTE — Discharge Summary (Signed)
[facility] [place] at [facility]   PATIENT NAME: Henry Dunn    MR#:  [id number]  DATE OF BIRTH:  05-13-1976  DATE OF ADMISSION:  02/21/2019   ADMITTING PHYSICIAN: Auburn Bilberry, MD  DATE OF DISCHARGE: 02/23/2019  PRIMARY CARE PHYSICIAN: Patient, No Pcp Per   ADMISSION DIAGNOSIS:  Shortness of breath [R06.02] Dyspnea, unspecified type [R06.00] Acute congestive heart failure, unspecified heart failure type (HCC) [I50.9] DISCHARGE DIAGNOSIS:  Active Problems:   Acute exacerbation of CHF (congestive heart failure) (HCC)  SECONDARY DIAGNOSIS:   Past Medical History:  Diagnosis Date   A-fib (HCC)    DVT (deep venous thrombosis) (HCC)    a. In setting of dislocated hip, status post IVC filter, previously on Coumadin, noncompliant over the past 12 months   Hx of blood clots    a. Patient denies any family history of clotting disorder, patient denies prior hypercoagulable workup   PE (pulmonary thromboembolism) [facility])    HOSPITAL COURSE:  Chief complaint; shortness of breath   HISTORY OF PRESENT ILLNESS: Henry Dunn  is a 43 y.o. male with a known history of Afib, DVT, PE who is presenting to the emergency room with complaint of shortness of breath and cough.  Patient states that he is progressively been getting short of breath for the past 1 month.  He states that he also has noticed swelling in his abdomen.  Patient has been seen by cardiology at [facility].  Patient was diagnosed with CHF exacerbation.  Referred to the H&P dictated for further details  Hospital course; 1.Acute systolic CHF exacerbation.  Patient adequately diuresed with IV Lasix.  Being discharged on p.o. Lasix.  Continue with ACE inhibitor and metoprolol XL.  Clinically and hemodynamically stable and wishes to be discharged home today. 2D echocardiogram done with ejection fraction of 20 to 25%.  Cavity size was moderate to severely dilated patient seen by cardiologist.  Appreciate input.   Increased metoprolol to 50 mg for improved heart rate control in the setting of atrial fibrillation.    2.  Paroxysmalatrial fibrillation  Heart rate better controlled on current dose of metoprolol XL 50 mg p.o. daily.  Continue the same on discharge.  Continue Xarelto   3.History of pulmonary embolism, DVT continue Xarelto  4.Nicotine abuse smoking cessation provided 4 minutes spent strongly recommend he stop smoking  DISCHARGE CONDITIONS:  Stable CONSULTS OBTAINED:   DRUG ALLERGIES:  No Known Allergies DISCHARGE MEDICATIONS:   Allergies as of 02/23/2019   No Known Allergies     Medication List    STOP taking these medications   aspirin 81 MG EC tablet   metoprolol tartrate 25 MG tablet Commonly known as:  LOPRESSOR     TAKE these medications   acetaminophen 325 MG tablet Commonly known as:  TYLENOL Take 2 tablets (650 mg total) by mouth every 6 (six) hours as needed for mild pain (or Fever >/= 101).   furosemide 40 MG tablet Commonly known as:  Lasix Take 1 tablet (40 mg total) by mouth daily.   losartan 25 MG tablet Commonly known as:  COZAAR Take 25 mg by mouth daily.   metoprolol succinate 25 MG 24 hr tablet Commonly known as:  TOPROL-XL Take 2 tablets (50 mg total) by mouth daily. Start taking on:  February 24, 2019 What changed:  how much to take   oxyCODONE-acetaminophen 5-325 MG tablet Commonly known as:  Percocet Take 1-2 tablets by mouth every 6 (six) hours as needed for  severe pain.   spironolactone 25 MG tablet Commonly known as:  Aldactone Take 0.5 tablets (12.5 mg total) by mouth daily.   Xarelto 20 MG Tabs tablet Generic drug:  rivaroxaban TAKE 1 TABLET BY MOUTH ONCE DAILY What changed:    how much to take  Another medication with the same name was removed. Continue taking this medication, and follow the directions you see here.        DISCHARGE INSTRUCTIONS:   DIET:  Cardiac diet DISCHARGE CONDITION:  Stable ACTIVITY:    Activity as tolerated OXYGEN:  Home Oxygen: No.  Oxygen Delivery: room air DISCHARGE LOCATION:  home   If you experience worsening of your admission symptoms, develop shortness of breath, life threatening emergency, suicidal or homicidal thoughts you must seek medical attention immediately by calling 911 or calling your MD immediately  if symptoms less severe.  You Must read complete instructions/literature along with all the possible adverse reactions/side effects for all the Medicines you take and that have been prescribed to you. Take any new Medicines after you have completely understood and accpet all the possible adverse reactions/side effects.   Please note  You were cared for by a hospitalist during your hospital stay. If you have any questions about your discharge medications or the care you received while you were in the hospital after you are discharged, you can call the unit and asked to speak with the hospitalist on call if the hospitalist that took care of you is not available. Once you are discharged, your primary care physician will handle any further medical issues. Please note that NO REFILLS for any discharge medications will be authorized once you are discharged, as it is imperative that you return to your primary care physician (or establish a relationship with a primary care physician if you do not have one) for your aftercare needs so that they can reassess your need for medications and monitor your lab values.    On the day of Discharge:  VITAL SIGNS:  Blood pressure 95/75, pulse (!) 59, temperature 98.4 F (36.9 C), temperature source Oral, resp. rate 16, height 5\' 9"  (1.753 m), weight 92.7 kg, SpO2 99 %. PHYSICAL EXAMINATION:  GENERAL:  43 y.o.-year-old patient lying in the bed with no acute distress.  EYES: Pupils equal, round, reactive to light and accommodation. No scleral icterus. Extraocular muscles intact.  HEENT: Head atraumatic, normocephalic. Oropharynx  and nasopharynx clear.  NECK:  Supple, no jugular venous distention. No thyroid enlargement, no tenderness.  LUNGS: Normal breath sounds bilaterally, no wheezing, rales,rhonchi or crepitation. No use of accessory muscles of respiration.  CARDIOVASCULAR: S1, S2 normal. No murmurs, rubs, or gallops.  ABDOMEN: Soft, non-tender, non-distended. Bowel sounds present. No organomegaly or mass.  EXTREMITIES: No pedal edema, cyanosis, or clubbing.  NEUROLOGIC: Cranial nerves II through XII are intact. Muscle strength 5/5 in all extremities. Sensation intact. Gait not checked.  PSYCHIATRIC: The patient is alert and oriented x 3.  SKIN: No obvious rash, lesion, or ulcer.  DATA REVIEW:   CBC Recent Labs  Lab 02/21/19 1711  WBC 7.8  HGB 14.6  HCT 44.9  PLT 197    Chemistries  Recent Labs  Lab 02/21/19 1929  02/23/19 0403  NA  --    < > 139  K  --    < > 3.7  CL  --    < > 101  CO2  --    < > 26  GLUCOSE  --    < >  101*  BUN  --    < > 18  CREATININE  --    < > 1.16  CALCIUM  --    < > 9.1  MG  --   --  1.9  AST 25  --   --   ALT 23  --   --   ALKPHOS 55  --   --   BILITOT 0.8  --   --    < > = values in this interval not displayed.     Microbiology Results  No results found for this or any previous visit.  RADIOLOGY:  No results found.   Management plans discussed with the patient, family and they are in agreement.  CODE STATUS: Full Code   TOTAL TIME TAKING CARE OF THIS PATIENT: 37 minutes.    Anne Sebring M.D on 02/23/2019 at 2:00 PM  Between 7am to 6pm - Pager - [phone]  After 6pm go to [email or web address] - Social research officer, government  [facility] [place] [facility]  Office  [phone]  CC: Primary care physician; Patient, No Pcp Per   Note: This dictation was prepared with Dragon dictation along with smaller phrase technology. Any transcriptional errors that result from this process are unintentional.

## 2019-03-01 ENCOUNTER — Telehealth: Payer: Self-pay

## 2019-03-01 NOTE — Telephone Encounter (Signed)
TELEPHONE CALL NOTE  Henry Dunn has been deemed a candidate for a follow-up tele-health visit to limit community exposure during the Covid-19 pandemic. I spoke with the patient via phone to ensure availability of phone/video source, confirm preferred email & phone number, discuss instructions and expectations, and review consent.   I reminded Dantwan Hosbach to be prepared with any vital sign and/or heart rhythm information that could potentially be obtained via home monitoring, at the time of his visit.  Finally, I reminded Ric Crafts to expect an e-mail containing a link for their video-based visit approximately 15 minutes before his visit, or alternatively, a phone call at the time of his visit if his visit is planned to be a phone encounter.  Did the patient verbally consent to treatment as below? YES  ALLRED, AMANDA L, CMA 03/01/2019 8:45 AM  CONSENT FOR TELE-HEALTH VISIT - PLEASE REVIEW  I hereby voluntarily request, consent and authorize [facility] and its employed or contracted physicians, physician assistants, nurse practitioners or other licensed health care professionals (the Practitioner), to provide me with telemedicine health care services (the "Services") as deemed necessary by the treating Practitioner. I acknowledge and consent to receive the Services by the Practitioner via telemedicine. I understand that the telemedicine visit will involve communicating with the Practitioner through telephonic communication technology and the disclosure of certain medical information by electronic transmission. I acknowledge that I have been given the opportunity to request an in-person assessment or other available alternative prior to the telemedicine visit and am voluntarily participating in the telemedicine visit.  I understand that I have the right to withhold or withdraw my consent to the use of telemedicine in the course of my care at any time, without affecting my right to  future care or treatment, and that the Practitioner or I may terminate the telemedicine visit at any time. I understand that I have the right to inspect all information obtained and/or recorded in the course of the telemedicine visit and may receive copies of available information for a reasonable fee.  I understand that some of the potential risks of receiving the Services via telemedicine include:  Marland Kitchen Delay or interruption in medical evaluation due to technological equipment failure or disruption; . Information transmitted may not be sufficient (e.g. poor resolution of images) to allow for appropriate medical decision making by the Practitioner; and/or  . In rare instances, security protocols could fail, causing a breach of personal health information.  Furthermore, I acknowledge that it is my responsibility to provide information about my medical history, conditions and care that is complete and accurate to the best of my ability. I acknowledge that Practitioner's advice, recommendations, and/or decision may be based on factors not within their control, such as incomplete or inaccurate data provided by me or lack of visual representation. I understand that the practice of medicine is not an exact science and that Practitioner makes no warranties or guarantees regarding treatment outcomes. I acknowledge that I will receive a copy of this consent concurrently upon execution via email to the email address I last provided but may also request a printed copy by calling the office of [facility].    I understand that my insurance may be billed for this visit.   I have read or had this consent read to me. . I understand the contents of this consent, which adequately explains the benefits and risks of the Services being provided via telemedicine.  . I have been  provided ample opportunity to ask questions regarding this consent and the Services and have had my questions answered to my  satisfaction. . I give my informed consent for the services to be provided through the use of telemedicine in my medical care  By participating in this telemedicine visit I agree to the above.

## 2019-03-01 NOTE — Telephone Encounter (Signed)
   TELEPHONE CALL NOTE  This patient has been deemed a candidate for follow-up tele-health visit to limit community exposure during the Covid-19 pandemic. I spoke with the patient via phone to discuss instructions. The patient was advised to review the section on consent for treatment as well. The patient will receive a phone call 2-3 days prior to their E-Visit at which time consent will be verbally confirmed. A Virtual Office Visit appointment type has been scheduled for 03/02/2019  with Clarisa Kindred FNP.  ALLRED, AMANDA L, CMA 03/01/2019 8:45 AM

## 2019-03-02 ENCOUNTER — Telehealth: Payer: Self-pay | Admitting: Family

## 2019-03-02 ENCOUNTER — Other Ambulatory Visit: Payer: Self-pay

## 2019-03-02 ENCOUNTER — Ambulatory Visit: Payer: Self-pay | Attending: Family | Admitting: Family

## 2019-03-02 NOTE — Telephone Encounter (Signed)
LM on patient's voicemail at 11:40am and then again at 2:00pm to do patient's virtual telephone visit at [facility]. Patient did not answer either time so left a message for patient to call us back.

## 2019-04-06 ENCOUNTER — Telehealth: Payer: Self-pay

## 2019-04-06 ENCOUNTER — Telehealth: Payer: Self-pay | Admitting: Cardiovascular Disease

## 2019-04-06 NOTE — Telephone Encounter (Signed)
Virtual Visit Pre-Appointment Phone Call  "(Name), I am calling you today to discuss your upcoming appointment. We are currently trying to limit exposure to the virus that causes COVID-19 by seeing patients at home rather than in the office."  1. "What is the BEST phone number to call the day of the visit?" - include this in appointment notes  2. Do you have or have access to (through a family member/friend) a smartphone with video capability that we can use for your visit?" a. If yes - list this number in appt notes as cell (if different from BEST phone #) and list the appointment type as a VIDEO visit in appointment notes b. If no - list the appointment type as a PHONE visit in appointment notes  3. Confirm consent - "In the setting of the current Covid19 crisis, you are scheduled for a (phone or video) visit with your provider on (date) at (time).  Just as we do with many in-office visits, in order for you to participate in this visit, we must obtain consent.  If you'd like, I can send this to your mychart (if signed up) or email for you to review.  Otherwise, I can obtain your verbal consent now.  All virtual visits are billed to your insurance company just like a normal visit would be.  By agreeing to a virtual visit, we'd like you to understand that the technology does not allow for your provider to perform an examination, and thus may limit your provider's ability to fully assess your condition. If your provider identifies any concerns that need to be evaluated in person, we will make arrangements to do so.  Finally, though the technology is pretty good, we cannot assure that it will always work on either your or our end, and in the setting of a video visit, we may have to convert it to a phone-only visit.  In either situation, we cannot ensure that we have a secure connection.  Are you willing to proceed?" STAFF: Did the patient verbally acknowledge consent to telehealth visit? Document  YES/NO here: Yes   4. Advise patient to be prepared - "Two hours prior to your appointment, go ahead and check your blood pressure, pulse, oxygen saturation, and your weight (if you have the equipment to check those) and write them all down. When your visit starts, your provider will ask you for this information. If you have an Apple Watch or Kardia device, please plan to have heart rate information ready on the day of your appointment. Please have a pen and paper handy nearby the day of the visit as well."  5. Give patient instructions for MyChart download to smartphone OR Doximity/Doxy.me as below if video visit (depending on what platform provider is using)  6. Inform patient they will receive a phone call 15 minutes prior to their appointment time (may be from unknown caller ID) so they should be prepared to answer    TELEPHONE CALL NOTE  Henry Dunn has been deemed a candidate for a follow-up tele-health visit to limit community exposure during the Covid-19 pandemic. I spoke with the patient via phone to ensure availability of phone/video source, confirm preferred email & phone number, and discuss instructions and expectations.  I reminded Henry Dunn to be prepared with any vital sign and/or heart rhythm information that could potentially be obtained via home monitoring, at the time of his visit. I reminded Henry Dunn to expect a phone call prior to his visit.  Henry Dunn 04/06/2019 3:09 PM   INSTRUCTIONS FOR DOWNLOADING THE MYCHART APP TO SMARTPHONE  - The patient must first make sure to have activated MyChart and know their login information - If Apple, go to [employer] and type in MyChart in the search bar and download the app. If Android, ask patient to go to [employer] and type in [place] in the search bar and download the app. The app is free but as with any other app downloads, their phone may require them to verify saved payment information or Apple/Android password.    - The patient will need to then log into the app with their MyChart username and password, and select [facility] as their healthcare provider to link the account. When it is time for your visit, go to the MyChart app, find appointments, and click Begin Video Visit. Be sure to Select Allow for your device to access the Microphone and Camera for your visit. You will then be connected, and your provider will be with you shortly.  **If they have any issues connecting, or need assistance please contact MyChart service desk [phone])**  **If using a computer, in order to ensure the best quality for their visit they will need to use either of the following Internet Browsers: [employer] or Google Chrome**  IF USING DOXIMITY or DOXY.ME - The patient will receive a link just prior to their visit by text.     FULL LENGTH CONSENT FOR TELE-HEALTH VISIT   I hereby voluntarily request, consent and authorize [facility] and its employed or contracted physicians, physician assistants, nurse practitioners or other licensed health care professionals (the Practitioner), to provide me with telemedicine health care services (the Services") as deemed necessary by the treating Practitioner. I acknowledge and consent to receive the Services by the Practitioner via telemedicine. I understand that the telemedicine visit will involve communicating with the Practitioner through live audiovisual communication technology and the disclosure of certain medical information by electronic transmission. I acknowledge that I have been given the opportunity to request an in-person assessment or other available alternative prior to the telemedicine visit and am voluntarily participating in the telemedicine visit.  I understand that I have the right to withhold or withdraw my consent to the use of telemedicine in the course of my care at any time, without affecting my right to future care or treatment, and that  the Practitioner or I may terminate the telemedicine visit at any time. I understand that I have the right to inspect all information obtained and/or recorded in the course of the telemedicine visit and may receive copies of available information for a reasonable fee.  I understand that some of the potential risks of receiving the Services via telemedicine include:   Delay or interruption in medical evaluation due to technological equipment failure or disruption;  Information transmitted may not be sufficient (e.g. poor resolution of images) to allow for appropriate medical decision making by the Practitioner; and/or   In rare instances, security protocols could fail, causing a breach of personal health information.  Furthermore, I acknowledge that it is my responsibility to provide information about my medical history, conditions and care that is complete and accurate to the best of my ability. I acknowledge that Practitioner's advice, recommendations, and/or decision may be based on factors not within their control, such as incomplete or inaccurate data provided by me or distortions of diagnostic images or specimens that may result from electronic transmissions. I understand that the  practice of medicine is not an Visual merchandiser and that Practitioner makes no warranties or guarantees regarding treatment outcomes. I acknowledge that I will receive a copy of this consent concurrently upon execution via email to the email address I last provided but may also request a printed copy by calling the office of [facility].    I understand that my insurance will be billed for this visit.   I have read or had this consent read to me.  I understand the contents of this consent, which adequately explains the benefits and risks of the Services being provided via telemedicine.   I have been provided ample opportunity to ask questions regarding this consent and the Services and have had my questions answered to  my satisfaction.  I give my informed consent for the services to be provided through the use of telemedicine in my medical care  By participating in this telemedicine visit I agree to the above.

## 2019-04-06 NOTE — Telephone Encounter (Signed)
Called patient from recall.  No answer. LMOV.  This is the second attempt per recall list.   

## 2019-04-09 DIAGNOSIS — I4819 Other persistent atrial fibrillation: Secondary | ICD-10-CM | POA: Insufficient documentation

## 2019-04-09 NOTE — Progress Notes (Signed)
Virtual Visit via Video Note   This visit type was conducted due to national recommendations for restrictions regarding the COVID-19 Pandemic (e.g. social distancing) in an effort to limit this patient's exposure and mitigate transmission in our community.  Due to his co-morbid illnesses, this patient is at least at moderate risk for complications without adequate follow up.  This format is felt to be most appropriate for this patient at this time.  All issues noted in this document were discussed and addressed.  A limited physical exam was performed with this format.  Please refer to the patient's chart for his consent to telehealth for [facility].   I connected with  Henry Dunn on 04/11/19 by a video enabled telemedicine application and verified that I am speaking with the correct person using two identifiers. I discussed the limitations of evaluation and management by telemedicine. The patient expressed understanding and agreed to proceed.   Evaluation Performed:  Follow-up visit  Date:  04/11/2019   ID:  Henry Dunn, DOB 12-Nov-1976, MRN [id number]  Patient Location:  [address] [place] [address]   Provider location:   [facility], [place] office  PCP:  Patient, No Pcp Per  Cardiologist:  Fonnie Mu   Chief Complaint: Atrial fibrillation, shortness of breath    History of Present Illness:    Henry Dunn is a 43 y.o. male who presents via audio/video conferencing for a telehealth visit today.   The patient does not symptoms concerning for COVID-19 infection (fever, chills, cough, or new SHORTNESS OF BREATH).   Patient has a past medical history of Previously diagnosed with right lower extremity DVT in the past, had IVC filter Chronic smoker, shortness of breath developed atrial fibrillation with RVR February 23 2017 after having groin abscess debrided, started on warfarin RT leg thrombectomy    DVT April 2019, noncompliance with warfarin  IVC filter in place He presents today for routine follow-up of his atrial fibrillation  Last seen in clinic April 2018  Sluggish, some days fine SOB, but much better before hospital Previously with abdominal swelling, that has improved About a scale but does not seem to work very well Does not have a blood pressure cuff at home He does appreciate palpitations concerning for atrial fibrillation  Recent hospital details reviewed with him, hospital records reviewed Hospital admission March 2020 acute on chronic diastolic and systolic CHF Presented in atrial fibrillation with RVR Reported having 1 month of shortness of breath Ejection fraction 20 to 25%, was not seen by cardiology  EKG at [facility] showing atrial fibrillation in February 2020  Several trips to the emergency room in February 2020  Imaging reviewed with him Echocardiogram March 2020  1. The left ventricle has severely reduced systolic function, with an ejection fraction of 20-25%. The cavity size was moderate to severely dilated. Left ventricular diastolic function could not be evaluated. Elevated left ventricular end-diastolic  pressure Left ventricular diffuse hypokinesis.  2. Left atrial size was moderately dilated.  3. Right atrial size was mildly dilated.  4. Mitral valve regurgitation is moderate by color flow Doppler.  March 2018 Was noted to be in atrial fibrillation/flutter Converted to normal sinus rhythm in the hospital In the hospital had paroxysmal A. fib/flutter, started on metoprolol CHADS2VASc at least 1 (vascular disease)  Echocardiogram 02/18/2017 - Left ventricle: The cavity size was mildly dilated. The estimated ejection fraction was in the range of 55% to 60%. Wall motion wasnormal - Left atrium:  The atrium was mildly dilated.   Prior CV studies:   The following studies were reviewed today:    Past Medical History:  Diagnosis Date  . A-fib (HCC)   . DVT (deep venous thrombosis) (HCC)     a. In setting of dislocated hip, status post IVC filter, previously on Coumadin, noncompliant over the past 12 months  . Hx of blood clots    a. Patient denies any family history of clotting disorder, patient denies prior hypercoagulable workup  . PE (pulmonary thromboembolism) (HCC)    Past Surgical History:  Procedure Laterality Date  . INSERTION OF VENA CAVA FILTER    . PERIPHERAL VASCULAR THROMBECTOMY Right 03/18/2018   Procedure: PERIPHERAL VASCULAR THROMBECTOMY;  Surgeon: Renford DillsSchnier, Gregory G, MD;  Location: [facility] INVASIVE CV LAB;  Service: Cardiovascular;  Laterality: Right;     Current Meds  Medication Sig  . furosemide (LASIX) 40 MG tablet Take 1 tablet (40 mg total) by mouth daily.  Marland Kitchen. losartan (COZAAR) 25 MG tablet Take 25 mg by mouth daily.  . metoprolol succinate (TOPROL-XL) 25 MG 24 hr tablet Take 2 tablets (50 mg total) by mouth daily.  Marland Kitchen. spironolactone (ALDACTONE) 25 MG tablet Take 0.5 tablets (12.5 mg total) by mouth daily.  Carlena Hurl. XARELTO 20 MG TABS tablet TAKE 1 TABLET BY MOUTH ONCE DAILY (Patient taking differently: Take 20 mg by mouth daily. )     Allergies:   Patient has no known allergies.   Social History   Tobacco Use  . Smoking status: Current Every Day Smoker    Packs/day: 0.50    Types: Cigarettes  . Smokeless tobacco: Never Used  Substance Use Topics  . Alcohol use: Yes    Alcohol/week: 12.0 standard drinks    Types: 12 Cans of beer per week  . Drug use: Not Currently     Current Outpatient Medications on File Prior to Visit  Medication Sig Dispense Refill  . furosemide (LASIX) 40 MG tablet Take 1 tablet (40 mg total) by mouth daily. 30 tablet 0  . losartan (COZAAR) 25 MG tablet Take 25 mg by mouth daily.    . metoprolol succinate (TOPROL-XL) 25 MG 24 hr tablet Take 2 tablets (50 mg total) by mouth daily. 60 tablet 0  . spironolactone (ALDACTONE) 25 MG tablet Take 0.5 tablets (12.5 mg total) by mouth daily. 30 tablet 0  . XARELTO 20 MG TABS tablet TAKE 1  TABLET BY MOUTH ONCE DAILY (Patient taking differently: Take 20 mg by mouth daily. ) 90 tablet 0   No current facility-administered medications on file prior to visit.      Family Hx: The patient's family history is negative for Diabetes and Hypertension.  ROS:   Please see the history of present illness.    Review of Systems  Constitutional: Negative.   HENT: Negative.   Respiratory: Positive for shortness of breath.   Cardiovascular: Negative.   Gastrointestinal: Negative.   Musculoskeletal: Negative.   Neurological: Negative.   Psychiatric/Behavioral: Negative.   All other systems reviewed and are negative.     Labs/Other Tests and Data Reviewed:    Recent Labs: 02/21/2019: ALT 23; B Natriuretic Peptide 1,817.0; Hemoglobin 14.6; Platelets 197 02/23/2019: BUN 18; Creatinine, Ser 1.16; Magnesium 1.9; Potassium 3.7; Sodium 139   Recent Lipid Panel No results found for: CHOL, TRIG, HDL, CHOLHDL, LDLCALC, LDLDIRECT  Wt Readings from Last 3 Encounters:  04/11/19 195 lb (88.5 kg)  02/23/19 204 lb 4.8 oz (92.7 kg)  01/06/19 210 lb (  95.3 kg)     Exam:    Vital Signs: Vital signs may also be detailed in the HPI Ht 5\' 9"  (1.753 m)   Wt 195 lb (88.5 kg)   BMI 28.80 kg/m   Wt Readings from Last 3 Encounters:  04/11/19 195 lb (88.5 kg)  02/23/19 204 lb 4.8 oz (92.7 kg)  01/06/19 210 lb (95.3 kg)   Temp Readings from Last 3 Encounters:  02/23/19 98.4 F (36.9 C) (Oral)  01/13/19 97.6 F (36.4 C) (Oral)  01/06/19 98.1 F (36.7 C) (Oral)   BP Readings from Last 3 Encounters:  02/23/19 110/65  01/13/19 (!) 148/130  01/06/19 102/67   Pulse Readings from Last 3 Encounters:  02/23/19 (!) 59  01/13/19 97  01/06/19 68    110 over 60s, previous pulses 70 Respirations 16  Well nourished, well developed male in no acute distress. Constitutional:  oriented to person, place, and time. No distress.  Head: Normocephalic and atraumatic.  Eyes:  no discharge. No scleral  icterus.  Neck: Normal range of motion. Neck supple.  Pulmonary/Chest: No audible wheezing, no distress, appears comfortable Musculoskeletal: Normal range of motion.  no  tenderness or deformity.  Neurological:   Coordination normal. Full exam not performed Skin:  No rash Psychiatric:  normal mood and affect. behavior is normal. Thought content normal.    ASSESSMENT & PLAN:    Persistent atrial fibrillation Recent finding atrial fibrillation likely leading to nonischemic cardiomyopathy Cardiology not involved on recent hospital admission March 2020 for acute on chronic diastolic and systolic CHF in the setting of atrial fibrillation with RVR -Reports he is compliant with Xarelto, proceed if the medication through the company [employer] -He is interested in restoring normal sinus rhythm.  Given dilated left atrium, cardiomyopathy, high risk of recurrent arrhythmia, we will start him on amiodarone 400 twice daily for 5 days then down to 200 twice daily He will monitor heart rate using app on his phone  Smoker We have encouraged him to continue to work on weaning his cigarettes and smoking cessation. He will continue to work on this and does not want any assistance with chantix.   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 Recommend he stay on Xarelto  Dilated cardiomyopathy Likely tachycardia mediated Had normal ejection fraction 2018 in normal sinus rhythm In atrial fibrillation 2020 ejection fraction 25% Appears that with stressors such as DVT and previous abscess in 2018 he converted to atrial fibrillation -Plan will be to restore normal sinus rhythm then echocardiogram to confirm ejection fraction if still low may need Entresto in place of losartan   COVID-19 Education: The signs and symptoms of COVID-19 were discussed with the patient and how to seek care for testing (follow up with PCP or arrange E-visit).  The importance of social distancing was discussed  today.  Patient Risk:   After full review of this patients clinical status, I feel that they are at least moderate risk at this time.  Time:   Today, I have spent 25 minutes with the patient with telehealth technology discussing the cardiac and medical problems/diagnoses detailed above   10 min spent reviewing the chart prior to patient visit today   Medication Adjustments/Labs and Tests Ordered: Current medicines are reviewed at length with the patient today.  Concerns regarding medicines are outlined above.   Tests Ordered: No tests ordered   Medication Changes: No changes made   Disposition: Follow-up in 1 month   Signed, Julien Nordmann,  MD  04/11/2019 10:04 AM    [facility] [address], [place], [place] [address]

## 2019-04-11 ENCOUNTER — Other Ambulatory Visit: Payer: Self-pay

## 2019-04-11 ENCOUNTER — Telehealth (INDEPENDENT_AMBULATORY_CARE_PROVIDER_SITE_OTHER): Payer: Self-pay | Admitting: Cardiovascular Disease

## 2019-04-11 VITALS — Ht 69.0 in | Wt 195.0 lb

## 2019-04-11 DIAGNOSIS — I483 Typical atrial flutter: Secondary | ICD-10-CM

## 2019-04-11 DIAGNOSIS — I4819 Other persistent atrial fibrillation: Secondary | ICD-10-CM

## 2019-04-11 DIAGNOSIS — I8291 Chronic embolism and thrombosis of unspecified vein: Secondary | ICD-10-CM

## 2019-04-11 DIAGNOSIS — F17200 Nicotine dependence, unspecified, uncomplicated: Secondary | ICD-10-CM

## 2019-04-11 DIAGNOSIS — I5043 Acute on chronic combined systolic (congestive) and diastolic (congestive) heart failure: Secondary | ICD-10-CM

## 2019-04-11 DIAGNOSIS — I420 Dilated cardiomyopathy: Secondary | ICD-10-CM

## 2019-04-11 MED ORDER — AMIODARONE HCL 200 MG PO TABS: ORAL_TABLET | ORAL | 0 refills | Status: DC

## 2019-04-11 MED ORDER — LOSARTAN POTASSIUM 25 MG PO TABS: 25.0000 mg | ORAL_TABLET | Freq: Every day | ORAL | 3 refills | Status: DC

## 2019-04-11 MED ORDER — SPIRONOLACTONE 25 MG PO TABS: 12.5000 mg | ORAL_TABLET | Freq: Every day | ORAL | 3 refills | Status: DC

## 2019-04-11 MED ORDER — RIVAROXABAN 20 MG PO TABS: 20.0000 mg | ORAL_TABLET | Freq: Every day | ORAL | 3 refills | Status: DC

## 2019-04-11 MED ORDER — FUROSEMIDE 40 MG PO TABS: 40.0000 mg | ORAL_TABLET | Freq: Every day | ORAL | 3 refills | Status: DC

## 2019-04-11 MED ORDER — METOPROLOL SUCCINATE ER 25 MG PO TB24: 50.0000 mg | ORAL_TABLET | Freq: Every day | ORAL | 3 refills | Status: DC

## 2019-04-11 NOTE — Patient Instructions (Addendum)
Medication Instructions:  Your physician has recommended you make the following change in your medication:  1. START Amiodarone 200 mg take 2 tablets (400 mg) twice a day for 5 days 2. THEN DECREASE Amiodarone 200 mg take 1 tablet (200 mg) twice a day  If you need a refill on your cardiac medications before your next appointment, please call your pharmacy.    Lab work: No new labs needed   If you have labs (blood work) drawn today and your tests are completely normal, you will receive your results only by: Marland Kitchen MyChart Message (if you have MyChart) OR . A paper copy in the mail If you have any lab test that is abnormal or we need to change your treatment, we will call you to review the results.   Testing/Procedures: No new testing needed   Follow-Up: At [facility], you and your health needs are our priority.  As part of our continuing mission to provide you with exceptional heart care, we have created designated Provider Care Teams.  These Care Teams include your primary Cardiologist (physician) and Advanced Practice Providers (APPs -  Physician Assistants and Nurse Practitioners) who all work together to provide you with the care you need, when you need it.  . You will need a follow up appointment in 3 to 4 weeks  . Providers on your designated Care Team:   . Nicolasa Ducking, NP . Eula Listen, PA-C . Marisue Ivan, PA-C  Any Other Special Instructions Will Be Listed Below (If Applicable).  For educational health videos Log in to : [email or web address] Or : [email or web address], password : [email or web address]

## 2019-05-06 NOTE — Progress Notes (Signed)
Dud not pick up for evisit  This encounter was created in error - please disregard.

## 2019-05-09 ENCOUNTER — Encounter: Payer: Self-pay | Admitting: Cardiovascular Disease

## 2019-05-09 ENCOUNTER — Other Ambulatory Visit: Payer: Self-pay

## 2019-05-10 ENCOUNTER — Ambulatory Visit (INDEPENDENT_AMBULATORY_CARE_PROVIDER_SITE_OTHER): Payer: Self-pay | Admitting: Nurse Practitioner

## 2019-05-11 ENCOUNTER — Other Ambulatory Visit: Payer: Self-pay | Admitting: Cardiovascular Disease

## 2019-05-11 NOTE — Telephone Encounter (Signed)
Pt is taking amiodarone 200 mg bid. Please advise if ok to refill for 90 days.

## 2019-05-12 NOTE — Telephone Encounter (Signed)
Yes okay to refill  

## 2019-05-30 ENCOUNTER — Ambulatory Visit (INDEPENDENT_AMBULATORY_CARE_PROVIDER_SITE_OTHER): Payer: Self-pay | Admitting: Nurse Practitioner

## 2019-06-13 ENCOUNTER — Ambulatory Visit (INDEPENDENT_AMBULATORY_CARE_PROVIDER_SITE_OTHER): Payer: Self-pay | Admitting: Nurse Practitioner

## 2019-06-16 ENCOUNTER — Encounter (INDEPENDENT_AMBULATORY_CARE_PROVIDER_SITE_OTHER): Payer: Self-pay | Admitting: Nurse Practitioner

## 2019-06-16 ENCOUNTER — Other Ambulatory Visit: Payer: Self-pay

## 2019-06-16 ENCOUNTER — Ambulatory Visit (INDEPENDENT_AMBULATORY_CARE_PROVIDER_SITE_OTHER): Payer: Self-pay | Admitting: Nurse Practitioner

## 2019-06-16 VITALS — BP 134/86 | HR 69 | Resp 16 | Wt 205.6 lb

## 2019-06-16 DIAGNOSIS — I8291 Chronic embolism and thrombosis of unspecified vein: Secondary | ICD-10-CM

## 2019-06-16 DIAGNOSIS — F17210 Nicotine dependence, cigarettes, uncomplicated: Secondary | ICD-10-CM

## 2019-06-16 DIAGNOSIS — F17200 Nicotine dependence, unspecified, uncomplicated: Secondary | ICD-10-CM

## 2019-06-16 DIAGNOSIS — I890 Lymphedema, not elsewhere classified: Secondary | ICD-10-CM

## 2019-06-21 NOTE — Progress Notes (Signed)
Patient ID: Henry Dunn, male    DOB: May 04, 1976, 43 y.o.   MRN: [id number]  HPI  Mr Zills is a 43 y/o male with a history of atrial fibrillation, DVT, pulmonary embolus, tobacco use and chronic heart failure.   Echo report from 02/22/2019 reviewed and showed an EF of 20-25% along with moderate MR.  Admitted 02/21/2019 due to acute HF. Cardiology consult obtained. Initially given IV lasix and then transitioned to oral diuretics. Metoprolol titrated due to atrial fibrillation. Discharged after 2 days.   He presents today for his initial visit with a chief complaint of minimal shortness of breath upon moderate exertion. He describes this as having been present for several months. He has associated fatigue, difficulty sleeping, light-headedness and left groin pain along with this. He denies any abdominal distention, chest pain, cough or weight gain. He says that he tried to weed-eat the other day and got quite short of breath when he finished 1/2 the yard.   Just got his amiodarone picked up due to finances and is planning on starting it today.   Past Medical History:  Diagnosis Date  . A-fib (HCC)   . Arrhythmia    atrial fibrillation  . CHF (congestive heart failure) (HCC)   . DVT (deep venous thrombosis) (HCC)    a. In setting of dislocated hip, status post IVC filter, previously on Coumadin, noncompliant over the past 12 months  . Hx of blood clots    a. Patient denies any family history of clotting disorder, patient denies prior hypercoagulable workup  . PE (pulmonary thromboembolism) (HCC)    Past Surgical History:  Procedure Laterality Date  . INSERTION OF VENA CAVA FILTER    . PERIPHERAL VASCULAR THROMBECTOMY Right 03/18/2018   Procedure: PERIPHERAL VASCULAR THROMBECTOMY;  Surgeon: Renford Dills, MD;  Location: [facility] INVASIVE CV LAB;  Service: Cardiovascular;  Laterality: Right;   Family History  Problem Relation Age of Onset  . Diabetes Neg Hx   . Hypertension Neg Hx     Social History   Tobacco Use  . Smoking status: Current Every Day Smoker    Packs/day: 0.50    Types: Cigarettes  . Smokeless tobacco: Never Used  Substance Use Topics  . Alcohol use: Yes    Alcohol/week: 12.0 standard drinks    Types: 12 Cans of beer per week   No Known Allergies Prior to Admission medications   Medication Sig Start Date End Date Taking? Authorizing Provider  furosemide (LASIX) 40 MG tablet Take 1 tablet (40 mg total) by mouth daily. 04/11/19 04/10/20 Yes Gollan, Tollie Pizza, MD  losartan (COZAAR) 25 MG tablet Take 1 tablet (25 mg total) by mouth daily. 04/11/19  Yes Antonieta Iba, MD  metoprolol succinate (TOPROL-XL) 25 MG 24 hr tablet Take 2 tablets (50 mg total) by mouth daily. 04/11/19  Yes Gollan, Tollie Pizza, MD  rivaroxaban (XARELTO) 20 MG TABS tablet Take 1 tablet (20 mg total) by mouth daily with supper. 04/11/19  Yes Antonieta Iba, MD  spironolactone (ALDACTONE) 25 MG tablet Take 0.5 tablets (12.5 mg total) by mouth daily. 04/11/19 04/10/20 Yes Gollan, Tollie Pizza, MD  amiodarone (PACERONE) 200 MG tablet Take 1 tablet (200 mg total) by mouth 2 (two) times daily. Patient not taking: Reported on 06/22/2019 05/12/19   Antonieta Iba, MD    Review of Systems  Constitutional: Positive for fatigue. Negative for appetite change.  HENT: Negative for congestion, postnasal drip and sore throat.   Eyes: Negative.  Respiratory: Positive for shortness of breath (with moderate exertion). Negative for cough.   Cardiovascular: Positive for palpitations (at times) and leg swelling. Negative for chest pain.  Gastrointestinal: Negative for abdominal distention and abdominal pain.  Endocrine: Negative.   Genitourinary: Negative.   Musculoskeletal: Positive for arthralgias (left groin pain at times). Negative for back pain.  Skin: Negative.   Allergic/Immunologic: Negative.   Neurological: Positive for light-headedness. Negative for dizziness.  Hematological: Negative for  adenopathy. Does not bruise/bleed easily.  Psychiatric/Behavioral: Positive for sleep disturbance (since being out of work). Negative for dysphoric mood. The patient is not nervous/anxious.     Vitals:   06/22/19 1332  BP: 98/74  Pulse: 79  Resp: 18  SpO2: 100%  Weight: 205 lb 4 oz (93.1 kg)  Height: 5\' 8"  (1.727 m)   Wt Readings from Last 3 Encounters:  06/22/19 205 lb 4 oz (93.1 kg)  06/16/19 205 lb 9.6 oz (93.3 kg)  04/11/19 195 lb (88.5 kg)   Lab Results  Component Value Date   CREATININE 1.16 02/23/2019   CREATININE 1.12 02/22/2019   CREATININE 1.10 02/21/2019    Physical Exam Vitals signs and nursing note reviewed.  Constitutional:      Appearance: He is well-developed.  HENT:     Head: Normocephalic and atraumatic.  Neck:     Musculoskeletal: Normal range of motion.     Vascular: No JVD.  Cardiovascular:     Rate and Rhythm: Normal rate. Rhythm irregular.  Pulmonary:     Effort: Pulmonary effort is normal. No respiratory distress.     Breath sounds: No wheezing or rales.  Skin:    General: Skin is warm and dry.  Neurological:     General: No focal deficit present.     Mental Status: He is alert and oriented to person, place, and time.  Psychiatric:        Mood and Affect: Mood normal.        Behavior: Behavior normal.     Assessment & Plan:  1: Chronic heart failure with reduced ejection fraction- - NYHA class II - euvolemic today - weighing but says that he has difficulty because he has older scales that have a dial in them. A set of digital scales was given to patient and he was instructed to weigh daily and call for an overnight weight gain of >2 pounds or a weekly weight gain of >5 pounds - not adding salt to his food and has been reading food labels so that he can follow a low sodium diet; written dietary information was given to him about this - BP does not allow changing his losartan to entresto - has been to [place]  in the past - BNP 02/21/2019 was 1817.0  2: Atrial fibrillation- - had telemedicine visit with cardiology Rockey Situ) 04/11/2019 - just got amiodarone picked up and will be starting it today; patient aware how to take it - BMP 02/23/2019 reviewed and showed sodium 139, potassium 3.7, creatinine 1.16 and GFR >60 - IVC filter in place  3: Tobacco use- - continues to smoke - complete cessation discussed for 3 months with patient  Patient did not bring his medications nor a list. Each medication was verbally reviewed with the patient and he was encouraged to bring the bottles to every visit to confirm accuracy of list.  Information given to patient regarding [facility] as well as [facility]

## 2019-06-22 ENCOUNTER — Encounter (INDEPENDENT_AMBULATORY_CARE_PROVIDER_SITE_OTHER): Payer: Self-pay | Admitting: Nurse Practitioner

## 2019-06-22 ENCOUNTER — Ambulatory Visit: Payer: Self-pay | Attending: Family | Admitting: Family

## 2019-06-22 ENCOUNTER — Encounter: Payer: Self-pay | Admitting: Family

## 2019-06-22 ENCOUNTER — Other Ambulatory Visit: Payer: Self-pay

## 2019-06-22 VITALS — BP 98/74 | HR 79 | Resp 18 | Ht 68.0 in | Wt 205.2 lb

## 2019-06-22 DIAGNOSIS — I5022 Chronic systolic (congestive) heart failure: Secondary | ICD-10-CM

## 2019-06-22 DIAGNOSIS — Z720 Tobacco use: Secondary | ICD-10-CM

## 2019-06-22 DIAGNOSIS — I4891 Unspecified atrial fibrillation: Secondary | ICD-10-CM | POA: Insufficient documentation

## 2019-06-22 DIAGNOSIS — I4819 Other persistent atrial fibrillation: Secondary | ICD-10-CM

## 2019-06-22 DIAGNOSIS — Z86711 Personal history of pulmonary embolism: Secondary | ICD-10-CM | POA: Insufficient documentation

## 2019-06-22 DIAGNOSIS — F17210 Nicotine dependence, cigarettes, uncomplicated: Secondary | ICD-10-CM | POA: Insufficient documentation

## 2019-06-22 DIAGNOSIS — Z79899 Other long term (current) drug therapy: Secondary | ICD-10-CM | POA: Insufficient documentation

## 2019-06-22 DIAGNOSIS — Z7901 Long term (current) use of anticoagulants: Secondary | ICD-10-CM | POA: Insufficient documentation

## 2019-06-22 NOTE — Patient Instructions (Addendum)
Continue weighing daily and call for an overnight weight gain of > 2 pounds or a weekly weight gain of >5 pounds.  [facility]   [phone]  [address]  [place] [address]  Hours: Tuesday: 4:15 pm - 7:30 pm  Wednesday: 9 am - 1 pm  Thursday: 1 pm - 7:30 pm  Friday - Monday: CLOSED 

## 2019-06-23 ENCOUNTER — Encounter (INDEPENDENT_AMBULATORY_CARE_PROVIDER_SITE_OTHER): Payer: Self-pay | Admitting: Nurse Practitioner

## 2019-06-23 NOTE — Progress Notes (Signed)
SUBJECTIVE:  Patient ID: Henry Dunn, male    DOB: 02-13-1976, 44 y.o.   MRN: [id number] Chief Complaint  Patient presents with  . Follow-up    40month lymph check    HPI  Henry Dunn is a 43 y.o. male The patient returns to the office for followup evaluation regarding leg swelling.  The swelling has improved quite a bit and the pain associated with swelling has decreased substantially. There have not been any interval development of a ulcerations or wounds.  Since the previous visit the patient has been wearing graduated compression stockings and has noted little significant improvement in the lymphedema. The patient has been using compression routinely morning until night.  The patient also states elevation during the day and exercise is being done too.     Past Medical History:  Diagnosis Date  . A-fib ([place])   . Arrhythmia    atrial fibrillation  . CHF (congestive heart failure) ([place])   . DVT (deep venous thrombosis) ([place])    a. In setting of dislocated hip, status post IVC filter, previously on Coumadin, noncompliant over the past 12 months  . Hx of blood clots    a. Patient denies any family history of clotting disorder, patient denies prior hypercoagulable workup  . PE (pulmonary thromboembolism) ([place])     Past Surgical History:  Procedure Laterality Date  . INSERTION OF VENA CAVA FILTER    . PERIPHERAL VASCULAR THROMBECTOMY Right 03/18/2018   Procedure: PERIPHERAL VASCULAR THROMBECTOMY;  Surgeon: Katha Cabal, MD;  Location: [facility];  Service: Cardiovascular;  Laterality: Right;    Social History   Socioeconomic History  . Marital status: Single    Spouse name: Not on file  . Number of children: Not on file  . Years of education: Not on file  . Highest education level: Not on file  Occupational History  . Not on file  Social Needs  . Financial resource strain: Not on file  . Food insecurity    Worry: Not on file    Inability: Not on file   . Transportation needs    Medical: Not on file    Non-medical: Not on file  Tobacco Use  . Smoking status: Current Every Day Smoker    Packs/day: 0.50    Types: Cigarettes  . Smokeless tobacco: Never Used  Substance and Sexual Activity  . Alcohol use: Yes    Alcohol/week: 12.0 standard drinks    Types: 12 Cans of beer per week  . Drug use: Not Currently  . Sexual activity: Not on file  Lifestyle  . Physical activity    Days per week: Not on file    Minutes per session: Not on file  . Stress: Not on file  Relationships  . Social Herbalist on phone: Not on file    Gets together: Not on file    Attends religious service: Not on file    Active member of club or organization: Not on file    Attends meetings of clubs or organizations: Not on file    Relationship status: Not on file  . Intimate partner violence    Fear of current or ex partner: Not on file    Emotionally abused: Not on file    Physically abused: Not on file    Forced sexual activity: Not on file  Other Topics Concern  . Not on file  Social History Narrative  . Not on file  Family History  Problem Relation Age of Onset  . Diabetes Neg Hx   . Hypertension Neg Hx     No Known Allergies   Review of Systems   Review of Systems: Negative Unless Checked Constitutional: [] Weight loss  [] Fever  [] Chills Cardiac: [] Chest pain   []  Atrial Fibrillation  [] Palpitations   [] Shortness of breath when laying flat   [] Shortness of breath with exertion. [] Shortness of breath at rest Vascular:  [] Pain in legs with walking   [] Pain in legs with standing [] Pain in legs when laying flat   [] Claudication    [] Pain in feet when laying flat    [x] History of DVT   [] Phlebitis   [x] Swelling in legs   [] Varicose veins   [] Non-healing ulcers Pulmonary:   [] Uses home oxygen   [] Productive cough   [] Hemoptysis   [] Wheeze  [] COPD   [] Asthma Neurologic:  [] Dizziness   [] Seizures  [] Blackouts [] History of stroke    [] History of TIA  [] Aphasia   [] Temporary Blindness   [] Weakness or numbness in arm   [] Weakness or numbness in leg Musculoskeletal:   [] Joint swelling   [] Joint pain   [] Low back pain  []  History of Knee Replacement [] Arthritis [] back Surgeries  []  Spinal Stenosis    Hematologic:  [] Easy bruising  [] Easy bleeding   [] Hypercoagulable state   [] Anemic Gastrointestinal:  [] Diarrhea   [] Vomiting  [] Gastroesophageal reflux/heartburn   [] Difficulty swallowing. [] Abdominal pain Genitourinary:  [] Chronic kidney disease   [] Difficult urination  [] Anuric   [] Blood in urine [] Frequent urination  [] Burning with urination   [] Hematuria Skin:  [] Rashes   [] Ulcers [] Wounds Psychological:  [] History of anxiety   []  History of major depression  []  Memory Difficulties      OBJECTIVE:   Physical Exam  BP 134/86 (BP Location: Right Arm)   Pulse 69   Resp 16   Wt 205 lb 9.6 oz (93.3 kg)   BMI 30.36 kg/m   Gen: WD/WN, NAD Head: [place]/AT, No temporalis wasting.  Ear/Nose/Throat: Hearing grossly intact, nares w/o erythema or drainage Eyes: PER, EOMI, sclera nonicteric.  Neck: Supple, no masses.  No JVD.  Pulmonary:  Good air movement, no use of accessory muscles.  Cardiac: RRR Vascular:  2+ edema bilaterally nonpitting Vessel Right Left  Radial Palpable Palpable  Dorsalis Pedis Palpable Palpable  Posterior Tibial Palpable Palpable   Gastrointestinal: soft, non-distended. No guarding/no peritoneal signs.  Musculoskeletal: M/S 5/5 throughout.  No deformity or atrophy.  Neurologic: Pain and light touch intact in extremities.  Symmetrical.  Speech is fluent. Motor exam as listed above. Psychiatric: Judgment intact, Mood & affect appropriate for pt's clinical situation. Dermatologic:  Bilateral stasis dermatitis.  No Ulcers Noted.  No changes consistent with cellulitis. Lymph : No Cervical lymphadenopathy, no lichenification or skin changes of chronic lymphedema.       ASSESSMENT AND PLAN:  1.  Lymphedema  No surgery or intervention at this point in time.    I have reviewed my discussion with the patient regarding lymphedema and why it  causes symptoms.  Patient will continue wearing graduated compression stockings class 1 (20-30 mmHg) on a daily basis a prescription was given. The patient is reminded to put the stockings on first thing in the morning and removing them in the evening. The patient is instructed specifically not to sleep in the stockings.   In addition, behavioral modification throughout the day will be continued.  This will include frequent elevation (such as in a recliner), use of  over the counter pain medications as needed and exercise such as walking.  I have reviewed systemic causes for chronic edema such as liver, kidney and cardiac etiologies and there does not appear to be any significant changes in these organ systems over the past year.  The patient is under the impression that these organ systems are all stable and unchanged.      The patient will follow-up with me on an annual basis.    2. Smoker Smoking cessation was discussed, 3-10 minutes spent on this topic specifically   3. 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  Patient will continue to follow conservative therapy as outlined above.   Current Outpatient Medications on File Prior to Visit  Medication Sig Dispense Refill  . amiodarone (PACERONE) 200 MG tablet Take 1 tablet (200 mg total) by mouth 2 (two) times daily. (Patient not taking: Reported on 06/22/2019) 180 tablet 0  . furosemide (LASIX) 40 MG tablet Take 1 tablet (40 mg total) by mouth daily. 90 tablet 3  . losartan (COZAAR) 25 MG tablet Take 1 tablet (25 mg total) by mouth daily. 90 tablet 3  . metoprolol succinate (TOPROL-XL) 25 MG 24 hr tablet Take 2 tablets (50 mg total) by mouth daily. 180 tablet 3  . rivaroxaban (XARELTO) 20 MG TABS tablet Take 1 tablet (20 mg  total) by mouth daily with supper. 90 tablet 3  . spironolactone (ALDACTONE) 25 MG tablet Take 0.5 tablets (12.5 mg total) by mouth daily. 45 tablet 3   No current facility-administered medications on file prior to visit.     There are no Patient Instructions on file for this visit. No follow-ups on file.   Georgiana Spinner, NP  This note was completed with Office manager.  Any errors are purely unintentional.

## 2019-07-19 NOTE — Progress Notes (Signed)
Cardiology Office Note  Date:  07/20/2019   ID:  Henry Dunn, DOB 19-Jul-1976, MRN [id number]  PCP:  [facility]   Chief Complaint  Patient presents with  . other    OD 1 month f/u c/o sob with exertion and pt mentioned late start on Amiodarone started the day he went to see [place]. Meds reviewed verbally with pt.    HPI:  Henry Dunn is a 43 year old gentleman with past medical history of Left hip dislocation  right lower extremity DVT in the past, had IVC filter Chronic smoker, shortness of breath developed atrial fibrillation with RVR February 23 2017 after having groin abscess debrided, started on warfarin RT leg thrombectomy    DVT April 2019, noncompliance with warfarin IVC filter in place He presents today for routine follow-up of his atrial fibrillation  Tele visit 04/11/2019 Started on amiodarone in effort to get him into NSR Was expensive Now on amiodarone x 1 month On xareltro  Still in atrial fib Having SOB with exertion  Left hip pain, prior hip dislocation Left leg swelling from prior DVT   EKG personally reviewed by myself on todays visit Atrial fib with rate 78 bpm  Other past medical hx Hospital admission March 2020 acute on chronic diastolic and systolic CHF Presented in atrial fibrillation with RVR Reported having 1 month of shortness of breath Ejection fraction 20 to 25%, was not seen by cardiology  EKG at [facility] showing atrial fibrillation in February 2020  Several trips to the emergency room in February 2020  Echocardiogram March 2020  1. The left ventricle has severely reduced systolic function, with an ejection fraction of 20-25%. The cavity size was moderate to severely dilated. Left ventricular diastolic function could not be evaluated. Elevated left ventricular end-diastolic  pressure Left ventricular diffuse hypokinesis.  2. Left atrial size was moderately dilated.  3. Right atrial size was mildly dilated.  4. Mitral  valve regurgitation is moderate by color flow Doppler.  March 2018 Was noted to be in atrial fibrillation/flutter Converted to normal sinus rhythm in the hospital In the hospital had paroxysmal A. fib/flutter, started on metoprolol CHADS2VASc at least 1 (vascular disease)  Echocardiogram 02/18/2017 - Left ventricle: The cavity size was mildly dilated. The estimated ejection fraction was in the range of 55% to 60%. Wall motion wasnormal - Left atrium: The atrium was mildly dilated.   PMH:   has a past medical history of A-fib (HCC), Arrhythmia, CHF (congestive heart failure) (HCC), DVT (deep venous thrombosis) (HCC), blood clots, and PE (pulmonary thromboembolism) (HCC).  PSH:    Past Surgical History:  Procedure Laterality Date  . INSERTION OF VENA CAVA FILTER    . PERIPHERAL VASCULAR THROMBECTOMY Right 03/18/2018   Procedure: PERIPHERAL VASCULAR THROMBECTOMY;  Surgeon: Renford Dills, MD;  Location: [facility] INV[facility];  Service: Cardiovascular;  Laterality: Right;    Current Outpatient Medications  Medication Sig Dispense Refill  . amiodarone (PACERONE) 200 MG tablet Take 1 tablet (200 mg total) by mouth 2 (two) times daily. 180 tablet 0  . furosemide (LASIX) 40 MG tablet Take 1 tablet (40 mg total) by mouth daily. 90 tablet 3  . losartan (COZAAR) 25 MG tablet Take 1 tablet (25 mg total) by mouth daily. 90 tablet 3  . metoprolol succinate (TOPROL-XL) 25 MG 24 hr tablet Take 2 tablets (50 mg total) by mouth daily. 180 tablet 3  . rivaroxaban (XARELTO) 20 MG TABS tablet Take 1 tablet (20 mg total) by  mouth daily with supper. 90 tablet 3  . spironolactone (ALDACTONE) 25 MG tablet Take 0.5 tablets (12.5 mg total) by mouth daily. 45 tablet 3   No current facility-administered medications for this visit.      Allergies:   Patient has no known allergies.   Social History:  The patient  reports that he has been smoking cigarettes. He has been smoking about 0.50 packs per day. He  has never used smokeless tobacco. He reports current alcohol use of about 12.0 standard drinks of alcohol per week. He reports previous drug use.   Family History:   family history is not on file.    Review of Systems: Review of Systems  Constitutional: Negative.   HENT: Negative.   Respiratory: Negative.   Cardiovascular: Negative.   Gastrointestinal: Negative.   Musculoskeletal: Negative.   Neurological: Negative.   Psychiatric/Behavioral: Negative.   All other systems reviewed and are negative.   PHYSICAL EXAM: VS:  BP 90/62 (BP Location: Left Arm, Patient Position: Sitting, Cuff Size: Normal)   Pulse 78   Ht 5\' 9"  (1.753 m)   Wt 205 lb 8 oz (93.2 kg)   BMI 30.35 kg/m  , BMI Body mass index is 30.35 kg/m. GEN: Well nourished, well developed, in no acute distress HEENT: normal Neck: no JVD, carotid bruits, or masses Cardiac: Irregularly irregular; no murmurs, rubs, or gallops,no edema  Trace lower extremity edema left leg Respiratory:  clear to auscultation bilaterally, normal work of breathing GI: soft, nontender, nondistended, + BS MS: no deformity or atrophy Skin: warm and dry, no rash Neuro:  Strength and sensation are intact Psych: euthymic mood, full affect   Recent Labs: 02/21/2019: ALT 23; B Natriuretic Peptide 1,817.0; Hemoglobin 14.6; Platelets 197 02/23/2019: BUN 18; Creatinine, Ser 1.16; Magnesium 1.9; Potassium 3.7; Sodium 139    Lipid Panel No results found for: CHOL, HDL, LDLCALC, TRIG    Wt Readings from Last 3 Encounters:  07/20/19 205 lb 8 oz (93.2 kg)  06/22/19 205 lb 4 oz (93.1 kg)  06/16/19 205 lb 9.6 oz (93.3 kg)     ASSESSMENT AND PLAN:  Problem List Items Addressed This Visit      Cardiology Problems   Typical atrial flutter (HCC)   Persistent atrial fibrillation   DVT (deep venous thrombosis) (HCC)    Other Visit Diagnoses    Chronic systolic heart failure (HCC)    -  Primary   Tobacco use       Dilated cardiomyopathy (HCC)          Atrial fib, persistent On xarelto Likely causing depressed EF and SOB on exertion Rate relatively well controlled, moderate shortness of breath symptoms on heavy exertion No insurance at this time We have offered cardioversion, to restore NSR Will check the price.  He would likely need to talk with billing department to get a price Other option we wait until his disability goes through then arrange cardioversion  Chronic deep vein thrombosis (DVT) of proximal vein of left lower extremity PE, in the past IVC filter was placed  continue chronic anticoagulation Followed by vascular Chronic mild left lower extremity swelling.  Seems to get worse when he is on his feet all day  Chronic left hip pain Reports having severe pain at times, feels it was dislocated in the past Has not seen orthopedics Limiting his ability to work  Tobacco abuse - Plan: EKG 12-Lead Cutting back We have encouraged him to continue to work on weaning his cigarettes  and smoking cessation. He will continue to work on this and does not want any assistance with chantix.   Disposition:   F/U  6 months   Total encounter time more than 25 minutes  Greater than 50% was spent in counseling and coordination of care with the patient    Signed, Dossie Arbourim Dhrithi Riche, M.D., Ph.D. [facility], [place] [phone]

## 2019-07-20 ENCOUNTER — Other Ambulatory Visit: Payer: Self-pay

## 2019-07-20 ENCOUNTER — Encounter: Payer: Self-pay | Admitting: Cardiovascular Disease

## 2019-07-20 ENCOUNTER — Ambulatory Visit (INDEPENDENT_AMBULATORY_CARE_PROVIDER_SITE_OTHER): Payer: Self-pay | Admitting: Cardiovascular Disease

## 2019-07-20 VITALS — BP 90/62 | HR 78 | Ht 69.0 in | Wt 205.5 lb

## 2019-07-20 DIAGNOSIS — I5022 Chronic systolic (congestive) heart failure: Secondary | ICD-10-CM

## 2019-07-20 DIAGNOSIS — I483 Typical atrial flutter: Secondary | ICD-10-CM

## 2019-07-20 DIAGNOSIS — I8291 Chronic embolism and thrombosis of unspecified vein: Secondary | ICD-10-CM

## 2019-07-20 DIAGNOSIS — I4819 Other persistent atrial fibrillation: Secondary | ICD-10-CM

## 2019-07-20 DIAGNOSIS — Z720 Tobacco use: Secondary | ICD-10-CM

## 2019-07-20 DIAGNOSIS — I420 Dilated cardiomyopathy: Secondary | ICD-10-CM

## 2019-07-20 NOTE — Patient Instructions (Signed)

## 2019-07-25 NOTE — Addendum Note (Signed)
Addended by: Anselm Pancoast on: 07/25/2019 08:28 AM   Modules accepted: Orders

## 2019-08-03 ENCOUNTER — Telehealth: Payer: Self-pay | Admitting: *Deleted

## 2019-08-03 NOTE — Telephone Encounter (Signed)
Left voicemail message to call back  

## 2019-08-03 NOTE — Telephone Encounter (Signed)
-----   Message from Alta Corning sent at 07/21/2019  1:06 PM EDT ----- Regarding: RE: Billing [place] [facility] can assist with Medicaid and patient accounting handles charity. The patient accounting number is [phone].  Would you like me to mail an application?  Alta Corning ----- Message ----- From: Valora Corporal, RN Sent: 07/20/2019   6:23 PM EDT To: Alta Corning Subject: RE: Billing                                    Do you have a contact person for that department so I can reach out to them? Thanks so very much for all of this information.   Olin Hauser  ----- Message ----- From: Alta Corning Sent: 07/20/2019   3:04 PM EDT To: Valora Corporal, RN Subject: RE: Billing                                    Good Afternoon Pam,  I noticed that this patient had Medicaid in the past. Is he no longer eligible? If not, he would need to be self pay and we can have a charity application sent to him. Normally, the financial/charity care department requires a $5,000 balance for patients to be eligible but they will also offer a financial consoler to assist the patient with getting Medicaid insurance again.    I hope this information helps. Please let me know if he would like to move forward and I will mail out the application.   Thank you,  Alta Corning ----- Message ----- From: Valora Corporal, RN Sent: 07/20/2019   1:36 PM EDT To: Alta Corning Subject: Billing                                        Looking for some assistance. This patient has no insurance and disability is pending. Dr. Rockey Situ wants him to have a cardioversion but of course the patient is unable to afford this. Are there any options for assistance so this patient can have cardioversion done? He would need to have it done here at [facility] by Dr. Rockey Situ.  Thanks Olin Hauser

## 2019-08-10 NOTE — Telephone Encounter (Signed)
Left voicemail message for patient to call back regarding number I have about some assistance to get a procedure done.

## 2019-08-14 NOTE — Progress Notes (Deleted)
Patient ID: Henry Dunn, male    DOB: [birth year], 43 y.o.   MRN: [id number]  HPI  Henry Dunn is a 43 y/o male with a history of atrial fibrillation, DVT, pulmonary embolus, tobacco use and chronic heart failure.   Echo report from 02/22/2019 reviewed and showed an EF of 20-25% along with moderate Henry.  Admitted 02/21/2019 due to acute HF. Cardiology consult obtained. Initially given IV lasix and then transitioned to oral diuretics. Metoprolol titrated due to atrial fibrillation. Discharged after 2 days.   Henry Dunn presents today for his follow-up visit with a chief complaint of    Past Medical History:  Diagnosis Date  . A-fib ([place])   . Arrhythmia    atrial fibrillation  . CHF (congestive heart failure) ([place])   . DVT (deep venous thrombosis) ([place])    a. In setting of dislocated hip, status post IVC filter, previously on Coumadin, noncompliant over the past 12 months  . Hx of blood clots    a. Patient denies any family history of clotting disorder, patient denies prior hypercoagulable workup  . PE (pulmonary thromboembolism) ([place])    Past Surgical History:  Procedure Laterality Date  . INSERTION OF VENA CAVA FILTER    . PERIPHERAL VASCULAR THROMBECTOMY Right 03/18/2018   Procedure: PERIPHERAL VASCULAR THROMBECTOMY;  Surgeon: Katha Cabal, MD;  Location: [facility];  Service: Cardiovascular;  Laterality: Right;   Family History  Problem Relation Age of Onset  . Diabetes Neg Hx   . Hypertension Neg Hx    Social History   Tobacco Use  . Smoking status: Current Every Day Smoker    Packs/day: 0.50    Types: Cigarettes  . Smokeless tobacco: Never Used  Substance Use Topics  . Alcohol use: Yes    Alcohol/week: 12.0 standard drinks    Types: 12 Cans of beer per week   No Known Allergies   Review of Systems  Constitutional: Positive for fatigue. Negative for appetite change.  HENT: Negative for congestion, postnasal drip and sore throat.   Eyes: Negative.    Respiratory: Positive for shortness of breath (with moderate exertion). Negative for cough.   Cardiovascular: Positive for palpitations (at times) and leg swelling. Negative for chest pain.  Gastrointestinal: Negative for abdominal distention and abdominal pain.  Endocrine: Negative.   Genitourinary: Negative.   Musculoskeletal: Positive for arthralgias (left groin pain at times). Negative for back pain.  Skin: Negative.   Allergic/Immunologic: Negative.   Neurological: Positive for light-headedness. Negative for dizziness.  Hematological: Negative for adenopathy. Does not bruise/bleed easily.  Psychiatric/Behavioral: Positive for sleep disturbance (since being out of work). Negative for dysphoric mood. The patient is not nervous/anxious.      Physical Exam Vitals signs and nursing note reviewed.  Constitutional:      Appearance: Henry Dunn is well-developed.  HENT:     Head: Normocephalic and atraumatic.  Neck:     Musculoskeletal: Normal range of motion.     Vascular: No JVD.  Cardiovascular:     Rate and Rhythm: Normal rate. Rhythm irregular.  Pulmonary:     Effort: Pulmonary effort is normal. No respiratory distress.     Breath sounds: No wheezing or rales.  Skin:    General: Skin is warm and dry.  Neurological:     General: No focal deficit present.     Mental Status: Henry Dunn is alert and oriented to person, place, and time.  Psychiatric:        Mood and Affect: Mood  normal.        Behavior: Behavior normal.     Assessment & Plan:  1: Chronic heart failure with reduced ejection fraction- - NYHA class II - euvolemic today - weighing but says that Henry Dunn has difficulty because Henry Dunn has older scales that have a dial in them. A set of digital scales was given to patient and Henry Dunn was instructed to weigh daily and call for an overnight weight gain of >2 pounds or a weekly weight gain of >5 pounds - not adding salt to his food and has been reading food labels so that Henry Dunn can follow a low  sodium diet; written dietary information was given to him about this - BP does not allow changing his losartan to entresto - has been to [facility] in the past - BNP 02/21/2019 was 1817.0  2: Atrial fibrillation- - had visit with cardiology Mariah Milling) 07/20/2019 - BMP 02/23/2019 reviewed and showed sodium 139, potassium 3.7, creatinine 1.16 and GFR >60 - IVC filter in place  3: Tobacco use- - continues to smoke - complete cessation discussed for 3 minutes with patient  Patient did not bring his medications nor a list. Each medication was verbally reviewed with the patient and Henry Dunn was encouraged to bring the bottles to every visit to confirm accuracy of list.

## 2019-08-15 ENCOUNTER — Encounter: Payer: Self-pay | Admitting: *Deleted

## 2019-08-15 NOTE — Telephone Encounter (Signed)
Left voicemail message to call back if he should receive message and that I would send letter as well. Letter completed and placed in outgoing mail.

## 2019-08-16 ENCOUNTER — Telehealth: Payer: Self-pay | Admitting: Family

## 2019-08-16 ENCOUNTER — Ambulatory Visit: Payer: Self-pay | Admitting: Family

## 2019-08-16 NOTE — Telephone Encounter (Signed)
Patient did not show for his [facility] Failure [facility] appointment on 08/16/2019. Will attempt to reschedule.

## 2019-09-12 ENCOUNTER — Ambulatory Visit: Payer: Self-pay | Admitting: Pharmacy Technician

## 2019-09-12 DIAGNOSIS — Z79899 Other long term (current) drug therapy: Secondary | ICD-10-CM

## 2019-09-15 ENCOUNTER — Ambulatory Visit: Payer: Self-pay | Admitting: Pharmacy Technician

## 2019-09-15 ENCOUNTER — Encounter (INDEPENDENT_AMBULATORY_CARE_PROVIDER_SITE_OTHER): Payer: Self-pay

## 2019-09-15 ENCOUNTER — Other Ambulatory Visit: Payer: Self-pay

## 2019-09-15 DIAGNOSIS — Z79899 Other long term (current) drug therapy: Secondary | ICD-10-CM

## 2019-09-15 NOTE — Progress Notes (Signed)
Met with patient completed financial assistance application for [place] due to recent hospital visit.  Patient agreed to be responsible for gathering financial information and forwarding to appropriate department in [facility]    Completed [facility] application and contract.  Patient agreed to all terms of the [facility] contract.    Patient approved to receive medication assistance at [facility] as long as eligibility criteria continues to be met.    Provided patient with Civil engineer, contracting based on his particular needs.    Referred patient for MTM.  [facility]

## 2019-09-15 NOTE — Progress Notes (Signed)
Patient stated that time for phone consult was inconvenient asked that appointment be changed to September 15, 2019.  [facility]

## 2019-09-18 ENCOUNTER — Ambulatory Visit (INDEPENDENT_AMBULATORY_CARE_PROVIDER_SITE_OTHER): Payer: MEDICAID | Admitting: Vascular Surgery

## 2019-10-30 ENCOUNTER — Telehealth: Payer: Self-pay | Admitting: Cardiovascular Disease

## 2019-10-30 NOTE — Telephone Encounter (Signed)
Spoke with patient and he reports that his weight went up over this past weekend due to him being out of his medication. Reports weight got up to 218 and once he got his meds he is back down to 209. Reviewed importance of low sodium diet, Fluid restriction, and medication compliance for his health. Confirmed his upcoming appointment next week with instructions to weight daily and bring log to his appointment. He verbalized understanding with no further questions at this time.

## 2019-10-30 NOTE — Telephone Encounter (Signed)
Pt c/o swelling: STAT is pt has developed SOB within 24 hours  1) How much weight have you gained and in what time span? 9 lbs last weekend ( did not take fluid pill because of refill )   2) If swelling, where is the swelling located? Abdomen   3) Are you currently taking a fluid pill? Yes and this has helped bloat or swelling in abd.  4) Are you currently SOB? yes  5) Do you have a log of your daily weights (if so, list)?  209 spiked to 218 when weekend with no fluid pill since resolved but still concerned about need to change meds or talk to Dr. Rockey Situ   6) Have you gained 3 pounds in a day or 5 pounds in a week? yes  7) Have you traveled recently? No    Scheduled 10/9 at 9 with dr. Rockey Situ patient ok with this timeframe for a visit.

## 2019-11-08 ENCOUNTER — Ambulatory Visit: Payer: Self-pay | Admitting: Cardiovascular Disease

## 2019-11-09 NOTE — Progress Notes (Signed)
Cardiology Office Note    Date:  11/10/2019   ID:  Henry MatteGary Rapley, DOB Mar 22, 1976, MRN [id number]  PCP:  [facility]  Cardiologist:  Julien Nordmannimothy Gollan, MD  Electrophysiologist:  None   Chief Complaint: Volume overload  History of Present Illness:   Henry Dunn is a 43 y.o. 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  Patient with a long history of tachypalpitation.  He was admitted to the hospital in 01/2017 with new onset atrial flutter with RVR.  Echo showed an EF of 55 to 60%, normal wall motion, mildly dilated left atrium, no significant valvular disease.  Patient converted to sinus rhythm during admission.  Arrhythmia was felt to possibly be in the setting of groin abscess.  He was also noted to have left lower extremity DVT.  Patient was seen in the ED several times in 12/2018 with A. fib with RVR with outpatient follow-up after rate control.  [facility]  There are no records available for review.  He was admitted to the hospital in 01/2019 with acute systolic CHF with echo showing an EF of 20 to 25%, moderately to severely dilated LV cavity, diffuse hypokinesis, moderately dilated left atrium, mildly dilated right atrium, moderate MR.  He was in A. fib with RVR.  He was consulted on by outside cardiology group and IV diuresed.  Troponin negative x2.  CTA chest showed no obvious PE.  Rate control therapy was escalated and patient was advised to follow-up with cardiology as outpatient.  He was seen virtually by primary  cardiologist in 03/2019 and was started on amiodarone load in preparation for restoration of sinus rhythm.  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  He was seen in person 3 months later, in 07/2019 and was still in A. fib with controlled ventricular response.  BP was soft at 90/62.  He was noted to be without insurance and there were concerns regarding price of potential cardioversion.  He was continued on current cardiac medications including amiodarone 200 mg twice daily, Lasix 40 mg daily, losartan 25 mg, Toprol-XL 50 mg, Xarelto 20 mg, and spironolactone 25 mg.  Weight at that visit documented 205 pounds.  Patient contacted our office on 11/30 noting a weight increase of 9 pounds and abdominal swelling with shortness of breath in the setting of running out of medication.  In this setting, appointment was scheduled with me for today.  Patient comes in noting during the latter half of November, 2020, he ran out of Lasix for approximately 2 days.  In this setting he noted a weight gain up to a maximum weight of 217 pounds.  Following resumption of Lasix at 40 mg daily he noted improvement in weight down to 209 pounds x 11/30.  Since then, weight has fluctuated between 208 and 214 pounds.  He reports compliance with Lasix 40 mg daily, losartan 25 mg daily, Toprol-XL 50 mg daily, Xarelto 20 mg daily, and Spironolactone 12.5 mg daily.  He reports he has not taken amiodarone in over 1 year.  He does note some  exertional tachypalpitations and shortness of breath.  He does note with his weight gain his abdomen has been distended.  He denies any lower extremity swelling, orthopnea, PND, or early satiety.  No chest pain.  No falls, BRBPR, melena, hemoptysis, hematuria, or hematemesis.  He drinks less than 2 L of fluid daily and does not eat a diet high in sodium.  He continues to prefer to defer cardioversion/rhythm control strategy in the setting of lack of insurance.   Labs: 01/2019 - magnesium 1.9,  potassium 3.7, BUN 18, serum creatinine 1.16, Hgb 14.6, PLT 197 12/2018 - albumin 3.7, AST/ALT normal 01/2017 - TSH normal  Past Medical History:  Diagnosis Date  . A-fib ([place])   . CHF (congestive heart failure) ([place])   . DVT (deep venous thrombosis) ([place])    a. In setting of dislocated hip, status post IVC filter, previously on Coumadin, noncompliant over the past 12 months  . Hx of blood clots    a. Patient denies any family history of clotting disorder, patient denies prior hypercoagulable workup  . PE (pulmonary thromboembolism) ([place])     Past Surgical History:  Procedure Laterality Date  . INSERTION OF VENA CAVA FILTER    . PERIPHERAL VASCULAR THROMBECTOMY Right 03/18/2018   Procedure: PERIPHERAL VASCULAR THROMBECTOMY;  Surgeon: Katha Cabal, MD;  Location: [facility];  Service: Cardiovascular;  Laterality: Right;    Current Medications: Current Meds  Medication Sig  . amiodarone (PACERONE) 200 MG tablet Take 1 tablet (200 mg total) by mouth 2 (two) times daily.  . furosemide (LASIX) 40 MG tablet Take 1 tablet (40 mg total) by mouth daily.  . metoprolol succinate (TOPROL-XL) 25 MG 24 hr tablet Take 3 tablets (75 mg total) by mouth daily.  . rivaroxaban (XARELTO) 20 MG TABS tablet Take 1 tablet (20 mg total) by mouth daily with supper.  Marland Kitchen spironolactone (ALDACTONE) 25 MG tablet Take 0.5 tablets (12.5 mg total) by mouth daily.  . [DISCONTINUED] losartan (COZAAR) 25 MG tablet Take 1 tablet (25 mg total) by mouth daily.  . [DISCONTINUED] metoprolol succinate (TOPROL-XL) 25 MG 24 hr tablet Take 2 tablets (50 mg total) by mouth daily.    Allergies:   Patient has no known allergies.   Social History   Socioeconomic History  . Marital status: Single    Spouse name: Not on file  . Number of children: Not on file  . Years of education: Not on file  . Highest education level: Not on file  Occupational History  . Not on file  Tobacco Use  . Smoking status: Current  Every Day Smoker    Packs/day: 0.50    Types: Cigarettes  . Smokeless tobacco: Never Used  Substance and Sexual Activity  . Alcohol use: Yes    Alcohol/week: 12.0 standard drinks    Types: 12 Cans of beer per week  . Drug use: Not Currently  . Sexual activity: Not on file  Other Topics Concern  . Not on file  Social History Narrative  . Not on file   Social Determinants of Health   Financial Resource Strain:   . Difficulty of Paying Living Expenses: Not on file  Food Insecurity:   . Worried About Charity fundraiser in the Last Year: Not on file  . Ran Out of Food in the Last Year: Not on file  Transportation Needs:   . Lack of Transportation (Medical): Not on file  . Lack of Transportation (Non-Medical): Not on file  Physical Activity:   . Days of Exercise per Week: Not on file  . Minutes of Exercise per Session: Not on file  Stress:   . Feeling of Stress : Not on file  Social Connections:   . Frequency of Communication with Friends and Family: Not on file  . Frequency of Social Gatherings with Friends and Family: Not on file  . Attends Religious Services: Not on file  . Active Member of Clubs or Organizations: Not on file  . Attends Banker Meetings: Not on file  . Marital Status: Not on file     Family History:  The patient's family history is negative for Diabetes and Hypertension.  ROS:   Review of Systems  Constitutional: Positive for malaise/fatigue. Negative for chills, diaphoresis, fever and weight loss.  HENT: Negative for congestion.   Eyes: Negative for discharge and redness.  Respiratory: Positive for shortness of breath. Negative for cough, hemoptysis, sputum production and wheezing.   Cardiovascular: Positive for palpitations. Negative for chest pain, orthopnea, claudication, leg swelling and PND.  Gastrointestinal: Negative for abdominal pain, blood in stool, heartburn, melena, nausea and vomiting.       Abdominal distention   Genitourinary: Negative for hematuria.  Musculoskeletal: Negative for falls and myalgias.  Skin: Negative for rash.  Neurological: Positive for weakness. Negative for dizziness, tingling, tremors, sensory change, speech change, focal weakness and loss of consciousness.  Endo/Heme/Allergies: Does not bruise/bleed easily.  Psychiatric/Behavioral: Negative for substance abuse. The patient is not nervous/anxious.   All other systems reviewed and are negative.    EKGs/Labs/Other Studies Reviewed:    Studies reviewed were summarized above. The additional studies were reviewed today:  2D Echo 01/2019: 1. The left ventricle has severely reduced systolic function, with an ejection fraction of 20-25%. The cavity size was moderate to severely dilated. Left ventricular diastolic function could not be evaluated. Elevated left ventricular end-diastolic  pressure Left ventricular diffuse hypokinesis.  2. Left atrial size was moderately dilated.  3. Right atrial size was mildly dilated.  4. Mitral valve regurgitation is moderate by color flow Doppler.   EKG:  EKG is ordered today.  The EKG ordered today demonstrates A. fib with RVR, 117 bpm, right axis deviation, nonspecific ST-T changes  Recent Labs: 02/21/2019: ALT 23; B Natriuretic Peptide 1,817.0; Hemoglobin 14.6; Platelets 197 02/23/2019: BUN 18; Creatinine, Ser 1.16; Magnesium 1.9; Potassium 3.7; Sodium 139  Recent Lipid Panel No results found for: CHOL, TRIG, HDL, CHOLHDL, VLDL, LDLCALC, LDLDIRECT  PHYSICAL EXAM:    VS:  BP 100/70 (BP Location: Left Arm, Patient Position: Sitting, Cuff Size: Normal)   Pulse (!) 117   Temp (!) 96.8 F (36 C)   Ht 5\' 8"  (1.727 m)   Wt 217 lb 8 oz (98.7 kg)   SpO2 97%   BMI 33.07 kg/m   BMI: Body mass index is 33.07 kg/m.  Physical Exam  Constitutional: He is oriented to person, place, and time. He appears well-developed and well-nourished.  HENT:  Head: Normocephalic and atraumatic.  Eyes: Right eye  exhibits no discharge. Left eye exhibits no discharge.  Neck: No JVD present.  Cardiovascular: S1 normal, S2 normal and normal heart sounds. An irregularly irregular rhythm present. Tachycardia present. Exam reveals no distant heart sounds, no friction rub, no midsystolic click and no opening snap.  No murmur heard. Pulses:      Posterior tibial pulses are 2+ on the right side and 2+ on the left side.  Pulmonary/Chest: Effort normal and breath  sounds normal. No respiratory distress. He has no decreased breath sounds. He has no wheezes. He has no rales. He exhibits no tenderness.  Abdominal: Soft. He exhibits distension. There is no abdominal tenderness.  Musculoskeletal:        General: No edema.     Cervical back: Normal range of motion.  Neurological: He is alert and oriented to person, place, and time.  Skin: Skin is warm and dry. No cyanosis. Nails show no clubbing.  Psychiatric: He has a normal mood and affect. His speech is normal and behavior is normal. Judgment and thought content normal.    Wt Readings from Last 3 Encounters:  11/10/19 217 lb 8 oz (98.7 kg)  07/20/19 205 lb 8 oz (93.2 kg)  06/22/19 205 lb 4 oz (93.1 kg)     ASSESSMENT & PLAN:   1. 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 fib with RVR as well as in the setting of running out of diuretic.  His weight is up 12 pounds today when compared to his most recent in person weight in 07/2019 with a weight trend of 205 to 217 pounds.  He has no lower extremity swelling and lungs are clear to auscultation bilaterally on exam.  His abdomen is distended.  Increase Lasix to 40 mg twice daily for 3 days followed by 40 mg daily thereafter.  Given his relative hypotension, in the setting of ongoing diuresis and need to escalate rate control for his A. fib, we will temporarily hold his losartan.  Check CBC and BMP today with  repeat BMP in follow-up in 1 week.  After he has been adequately diuresed, would look to restart losartan, or if BP and patient assistance allows, would place him on Entresto in place of losartan along with continuation of Toprol-XL and spironolactone to optimize GDMT.  Following outpatient diuresis would recommend we revisit cardioversion with the patient if he is amenable to further discussion of this.  Following restoration of sinus rhythm recommend repeating limited echo to evaluate for improvement in LV systolic function.  If his LV systolic function remains reduced following optimization of GDMT and with adequate rate/rhythm control would pursue ischemic evaluation followed by referral to EP for consideration of ICD as well as referral to advanced heart failure clinic.  2. Persistent A. fib with RVR: Ventricular rates mildly elevated in the 1 teens in the office today.  This is likely exacerbated his volume overload.  Escalate Toprol-XL to 75 mg daily.  With this, we have temporarily held his losartan as outlined above to allow for adequate BP room for rate control.  I have gone ahead and discontinued amiodarone as he has not taken this in over 1 year.  This is not a good longstanding medication for the patient given his young age.  Continue Xarelto.  Following outpatient diuresis we will need to revisit potential rhythm control strategy sooner rather than later as the longer he remains in A. fib the more difficult it will be to get him out.  3. Recurrent DVT with history of PE: Compliant with Xarelto.  Status post IVC filter.  Followed by vascular surgery.  4. Tobacco use: Complete cessation is recommended.  Disposition: F/u with Dr. Mariah Milling or an APP in 1 week.   Medication Adjustments/Labs and Tests Ordered: Current medicines are reviewed at length with the patient today.  Concerns regarding medicines are outlined above. Medication changes, Labs and Tests ordered  today are summarized above and  listed in the Patient Instructions accessible in Encounters.   Signed, Eula Listenyan , PA-C 11/10/2019 1:03 PM     [facility] [place] [address] [place], [place] [address] [phone]

## 2019-11-10 ENCOUNTER — Other Ambulatory Visit: Payer: Self-pay

## 2019-11-10 ENCOUNTER — Encounter: Payer: Self-pay | Admitting: Physician Assistant

## 2019-11-10 ENCOUNTER — Ambulatory Visit (INDEPENDENT_AMBULATORY_CARE_PROVIDER_SITE_OTHER): Payer: Self-pay | Admitting: Physician Assistant

## 2019-11-10 VITALS — BP 100/70 | HR 117 | Temp 96.8°F | Ht 68.0 in | Wt 217.5 lb

## 2019-11-10 DIAGNOSIS — I5043 Acute on chronic combined systolic (congestive) and diastolic (congestive) heart failure: Secondary | ICD-10-CM

## 2019-11-10 DIAGNOSIS — I5022 Chronic systolic (congestive) heart failure: Secondary | ICD-10-CM

## 2019-11-10 DIAGNOSIS — Z720 Tobacco use: Secondary | ICD-10-CM

## 2019-11-10 DIAGNOSIS — I4819 Other persistent atrial fibrillation: Secondary | ICD-10-CM

## 2019-11-10 DIAGNOSIS — I82409 Acute embolism and thrombosis of unspecified deep veins of unspecified lower extremity: Secondary | ICD-10-CM

## 2019-11-10 DIAGNOSIS — Z86711 Personal history of pulmonary embolism: Secondary | ICD-10-CM

## 2019-11-10 MED ORDER — METOPROLOL SUCCINATE ER 25 MG PO TB24: 75.0000 mg | ORAL_TABLET | Freq: Every day | ORAL | 3 refills | Status: DC

## 2019-11-10 NOTE — Patient Instructions (Addendum)
Medication Instructions:  1- STOP Losartan 2- INCREASE Toprol to 3 tablets (75 mg total) once daily 3- INCREASE Lasix to 40 mg total twice daily for 3 DAYS ONLY, then resume 40 mg total once daily thereafter  *If you need a refill on your cardiac medications before your next appointment, please call your pharmacy*  Lab Work: Your physician recommends that you return for lab work today. (CBC, BMET)    If you have labs (blood work) drawn today and your tests are completely normal, you will receive your results only by: Marland Kitchen MyChart Message (if you have MyChart) OR . A paper copy in the mail If you have any lab test that is abnormal or we need to change your treatment, we will call you to review the results.  Testing/Procedures: None ordered   Follow-Up: At [facility], you and your health needs are our priority.  As part of our continuing mission to provide you with exceptional heart care, we have created designated Provider Care Teams.  These Care Teams include your primary Cardiologist (physician) and Advanced Practice Providers (APPs -  Physician Assistants and Nurse Practitioners) who all work together to provide you with the care you need, when you need it.  Your next appointment:   1 week(s)  The format for your next appointment:   In Person  Provider:    You may see Ida Rogue, MD or Christell Faith, PA-C.

## 2019-11-11 LAB — CBC
Hematocrit: 48.6 % (ref 37.5–51.0)
Hemoglobin: 16.4 g/dL (ref 13.0–17.7)
MCH: 32.1 pg (ref 26.6–33.0)
MCHC: 33.7 g/dL (ref 31.5–35.7)
MCV: 95 fL (ref 79–97)
Platelets: 274 10*3/uL (ref 150–450)
RBC: 5.11 x10E6/uL (ref 4.14–5.80)
RDW: 13 % (ref 11.6–15.4)
WBC: 7.5 10*3/uL (ref 3.4–10.8)

## 2019-11-11 LAB — BASIC METABOLIC PANEL
BUN/Creatinine Ratio: 12 (ref 9–20)
BUN: 19 mg/dL (ref 6–24)
CO2: 21 mmol/L (ref 20–29)
Calcium: 9.8 mg/dL (ref 8.7–10.2)
Chloride: 99 mmol/L (ref 96–106)
Creatinine, Ser: 1.65 mg/dL — ABNORMAL HIGH (ref 0.76–1.27)
GFR calc Af Amer: 58 mL/min/{1.73_m2} — ABNORMAL LOW (ref >59)
GFR calc non Af Amer: 50 mL/min/{1.73_m2} — ABNORMAL LOW (ref >59)
Glucose: 112 mg/dL — ABNORMAL HIGH (ref 65–99)
Potassium: 4.5 mmol/L (ref 3.5–5.2)
Sodium: 138 mmol/L (ref 134–144)

## 2019-11-13 ENCOUNTER — Telehealth: Payer: Self-pay

## 2019-11-13 DIAGNOSIS — I5043 Acute on chronic combined systolic (congestive) and diastolic (congestive) heart failure: Secondary | ICD-10-CM

## 2019-11-13 NOTE — Telephone Encounter (Signed)
-----   Message from Rise Mu, PA-C sent at 11/12/2019  8:01 AM EST ----- Labs showed elevated renal function, possibly in the setting of volume overload. Potassium at goal. Random glucose is ok. Blood count is normal. Recheck BMET on 12/14 or 12/15  to reassess renal function.

## 2019-11-13 NOTE — Telephone Encounter (Signed)
Call to patient to discuss results. Pt verbalized understanding and will [facility] medical mall [facility] repeat labs.   He will go either today or tomorrow.   Order placed.   Advised pt to call for any further questions or concerns.

## 2019-11-19 NOTE — Progress Notes (Signed)
Cardiology Office Note  Date:  11/20/2019   ID:  Henry Dunn, DOB [birth year], MRN [id number]  PCP:  [name]   Chief Complaint  Patient presents with  . Follow-up    1 week follow up. Meds reviewed by the pt. verbally. Pt. c/o shortness of breath.     HPI:  Henry Dunn is a 43 year old gentleman with past medical history of Left hip dislocation  right lower extremity DVT in the past, had IVC filter Chronic smoker, shortness of breath developed atrial fibrillation with RVR February 23 2017 after having groin abscess debrided, started on warfarin RT leg thrombectomy    DVT April 2019, noncompliance with warfarin IVC filter in place Echo 01/2019 left ventricle has severely reduced systolic function, with an ejection fraction of 20-25%. He presents today for routine follow-up of his atrial fibrillation  Recently seen in the clinic last week for weight gain abdominal bloating shortness of breath symptoms He was told to take extra Lasix for several days then back down to 40 daily On today's visit reports that his abdomen is still bloated Recently had to stop secondary to shortness of breath  He is on Xarelto, remains in atrial fibrillation Reports having some left hip pain, prior hip dislocation Chronic mild left leg swelling from prior DVT   We previously discussed getting him back to normal sinus rhythm, he did not have the finances to make this happen  EKG personally reviewed by myself on todays visit Atrial fib with rate 76 bpm  Other past medical hx Hospital admission March 2020 acute on chronic diastolic and systolic CHF Presented in atrial fibrillation with RVR Reported having 1 month of shortness of breath Ejection fraction 20 to 25%, was not seen by cardiology  EKG at [facility] showing atrial fibrillation in February 2020  Several trips to the emergency room in February 2020  Echocardiogram March 2020  1. The left ventricle has severely reduced  systolic function, with an ejection fraction of 20-25%. The cavity size was moderate to severely dilated. Left ventricular diastolic function could not be evaluated. Elevated left ventricular end-diastolic  pressure Left ventricular diffuse hypokinesis.  2. Left atrial size was moderately dilated.  3. Right atrial size was mildly dilated.  4. Mitral valve regurgitation is moderate by color flow Doppler.  March 2018 Was noted to be in atrial fibrillation/flutter Converted to normal sinus rhythm in the hospital In the hospital had paroxysmal A. fib/flutter, started on metoprolol CHADS2VASc at least 1 (vascular disease)  Echocardiogram 02/18/2017 - Left ventricle: The cavity size was mildly dilated. The estimated ejection fraction was in the range of 55% to 60%. Wall motion wasnormal - Left atrium: The atrium was mildly dilated.   PMH:   has a past medical history of A-fib (HCC), CHF (congestive heart failure) (HCC), DVT (deep venous thrombosis) (HCC), blood clots, and PE (pulmonary thromboembolism) (HCC).  PSH:    Past Surgical History:  Procedure Laterality Date  . INSERTION OF VENA CAVA FILTER    . PERIPHERAL VASCULAR THROMBECTOMY Right 03/18/2018   Procedure: PERIPHERAL VASCULAR THROMBECTOMY;  Surgeon: Renford Dills, MD;  Location: [facility] INVASIVE CV LAB;  Service: Cardiovascular;  Laterality: Right;    Current Outpatient Medications  Medication Sig Dispense Refill  . furosemide (LASIX) 40 MG tablet Take 1 tablet (40 mg total) by mouth daily. 90 tablet 3  . metoprolol succinate (TOPROL-XL) 25 MG 24 hr tablet Take 3 tablets (75 mg total) by mouth daily. 180 tablet 3  .  rivaroxaban (XARELTO) 20 MG TABS tablet Take 1 tablet (20 mg total) by mouth daily with supper. 90 tablet 3  . spironolactone (ALDACTONE) 25 MG tablet Take 0.5 tablets (12.5 mg total) by mouth daily. 45 tablet 3   No current facility-administered medications for this visit.     Allergies:   Patient has no  known allergies.   Social History:  The patient  reports that he has been smoking cigarettes. He has been smoking about 0.50 packs per day. He has never used smokeless tobacco. He reports current alcohol use of about 12.0 standard drinks of alcohol per week. He reports previous drug use.   Family History:   family history is not on file.    Review of Systems: Review of Systems  Constitutional: Negative.   HENT: Negative.   Respiratory: Positive for shortness of breath.   Cardiovascular: Negative.   Gastrointestinal: Negative.        Abdominal fullness  Musculoskeletal: Negative.   Neurological: Negative.   Psychiatric/Behavioral: Negative.   All other systems reviewed and are negative.   PHYSICAL EXAM: VS:  BP 116/75 (BP Location: Left Arm, Patient Position: Sitting, Cuff Size: Normal)   Temp (!) 97.3 F (36.3 C)   Ht 5\' 8"  (1.727 m)   Wt 219 lb 8 oz (99.6 kg)   BMI 33.37 kg/m  , BMI Body mass index is 33.37 kg/m. Constitutional:  oriented to person, place, and time. No distress.  HENT:  Head: Grossly normal Eyes:  no discharge. No scleral icterus.  Neck: No JVD, no carotid bruits  Cardiovascular: Regular rate and rhythm, no murmurs appreciated Pulmonary/Chest: Clear to auscultation bilaterally, no wheezes or rails Abdominal: Soft.  no distension.  no tenderness.  Musculoskeletal: Normal range of motion Neurological:  normal muscle tone. Coordination normal. No atrophy Skin: Skin warm and dry Psychiatric: normal affect, pleasant   Recent Labs: 02/21/2019: ALT 23; B Natriuretic Peptide 1,817.0 02/23/2019: Magnesium 1.9 11/10/2019: BUN 19; Creatinine, Ser 1.65; Hemoglobin 16.4; Platelets 274; Potassium 4.5; Sodium 138    Lipid Panel No results found for: CHOL, HDL, LDLCALC, TRIG    Wt Readings from Last 3 Encounters:  11/20/19 219 lb 8 oz (99.6 kg)  11/10/19 217 lb 8 oz (98.7 kg)  07/20/19 205 lb 8 oz (93.2 kg)     ASSESSMENT AND PLAN:  Problem List Items  Addressed This Visit      Cardiology Problems   Typical atrial flutter (HCC)   DVT (deep venous thrombosis) (HCC)    Other Visit Diagnoses    Persistent atrial fibrillation with RVR (HCC)    -  Primary   History of pulmonary embolism       Chronic systolic heart failure (HCC)       Recurrent acute deep vein thrombosis (DVT) of lower extremity, unspecified laterality (HCC)       Acute on chronic combined systolic (congestive) and diastolic (congestive) heart failure (HCC)       Tobacco use       Dilated cardiomyopathy (HCC)         Atrial fib, persistent On xarelto Likely causing depressed EF and SOB on exertion Rate relatively well controlled , last week metoprolol dose was increased and losartan was held -Reports he does not have the finances to perform cardioversion.  He will get back to [place] when this is possible Restoring normal sinus rhythm would likely help his shortness of breath, CHF symptoms and would restore his ejection fraction  Chronic deep vein thrombosis (  DVT) of proximal vein of left lower extremity PE, in the past IVC filter was placed  continue chronic anticoagulation Stable   Chronic left hip pain Chronic issue  dislocated in the past Needs follow-up with orthopedics  Tobacco abuse - Plan: EKG 12-Lead Cutting back Recommended smoking cessation  Acute on chronic diastolic and systolic CHF Recommend he moderate his fluid intake Increase Lasix up to 40 twice daily Recent elevated creatinine likely from cardiorenal syndrome  Disposition:   F/U  1 month   Total encounter time more than 25 minutes  Greater than 50% was spent in counseling and coordination of care with the patient   Signed, Esmond Plants, M.D., Ph.D. [place], [place]

## 2019-11-20 ENCOUNTER — Ambulatory Visit (INDEPENDENT_AMBULATORY_CARE_PROVIDER_SITE_OTHER): Payer: Self-pay | Admitting: Cardiovascular Disease

## 2019-11-20 ENCOUNTER — Encounter: Payer: Self-pay | Admitting: Cardiovascular Disease

## 2019-11-20 ENCOUNTER — Other Ambulatory Visit: Payer: Self-pay

## 2019-11-20 VITALS — BP 116/75 | Temp 97.3°F | Ht 68.0 in | Wt 219.5 lb

## 2019-11-20 DIAGNOSIS — I8291 Chronic embolism and thrombosis of unspecified vein: Secondary | ICD-10-CM

## 2019-11-20 DIAGNOSIS — I483 Typical atrial flutter: Secondary | ICD-10-CM

## 2019-11-20 DIAGNOSIS — Z720 Tobacco use: Secondary | ICD-10-CM

## 2019-11-20 DIAGNOSIS — I82409 Acute embolism and thrombosis of unspecified deep veins of unspecified lower extremity: Secondary | ICD-10-CM

## 2019-11-20 DIAGNOSIS — I5043 Acute on chronic combined systolic (congestive) and diastolic (congestive) heart failure: Secondary | ICD-10-CM

## 2019-11-20 DIAGNOSIS — I5022 Chronic systolic (congestive) heart failure: Secondary | ICD-10-CM

## 2019-11-20 DIAGNOSIS — I4819 Other persistent atrial fibrillation: Secondary | ICD-10-CM

## 2019-11-20 DIAGNOSIS — I420 Dilated cardiomyopathy: Secondary | ICD-10-CM

## 2019-11-20 DIAGNOSIS — Z86711 Personal history of pulmonary embolism: Secondary | ICD-10-CM

## 2019-11-20 MED ORDER — FUROSEMIDE 40 MG PO TABS: 40.0000 mg | ORAL_TABLET | Freq: Two times a day (BID) | ORAL | 3 refills | Status: DC

## 2019-11-20 NOTE — Patient Instructions (Addendum)
Medication Instructions:  Please increase the lasix/furosemide up to 40 twice a day  Monitor weight daily Decrease fluid intake  If you need a refill on your cardiac medications before your next appointment, please call your pharmacy.    Lab work: No new labs needed   If you have labs (blood work) drawn today and your tests are completely normal, you will receive your results only by: Marland Kitchen MyChart Message (if you have MyChart) OR . A paper copy in the mail If you have any lab test that is abnormal or we need to change your treatment, we will call you to review the results.   Testing/Procedures: No new testing needed   Follow-Up: At [facility], you and your health needs are our priority.  As part of our continuing mission to provide you with exceptional heart care, we have created designated Provider Care Teams.  These Care Teams include your primary Cardiologist (physician) and Advanced Practice Providers (APPs -  Physician Assistants and Nurse Practitioners) who all work together to provide you with the care you need, when you need it.  . You will need a follow up appointment in 1 month   . Providers on your designated Care Team:   . Murray Hodgkins, NP . Christell Faith, PA-C . Marrianne Mood, PA-C  Any Other Special Instructions Will Be Listed Below (If Applicable).  For educational health videos Log in to : [email or web address] Or : [email or web address], password : [email or web address]

## 2019-12-24 NOTE — Progress Notes (Deleted)
Cardiology Office Note  Date:  12/24/2019   ID:  Henry Dunn, DOB 26-Jul-1976, MRN [id number]  [facility]P:  [facility]   No chief complaint on file.   HPI:  Mr. Henry Dunn is a 44 year old gentleman with past medical history of Left hip dislocation  right lower extremity DVT in the past, had IVC filter Chronic smoker, shortness of breath developed atrial fibrillation with RVR February 23 2017 after having groin abscess debrided, started on warfarin RT leg thrombectomy    DVT April 2019, noncompliance with warfarin IVC filter in place Echo 01/2019 left ventricle has severely reduced systolic function, with an ejection fraction of 20-25%. He presents today for routine follow-up of his atrial fibrillation  Recently seen in the clinic last week for weight gain abdominal bloating shortness of breath symptoms He was told to take extra Lasix for several days then back down to 40 daily On today's visit reports that his abdomen is still bloated Recently had to stop secondary to shortness of breath  He is on Xarelto, remains in atrial fibrillation Reports having some left hip pain, prior hip dislocation Chronic mild left leg swelling from prior DVT   We previously discussed getting him back to normal sinus rhythm, he did not have the finances to make this happen  EKG personally reviewed by myself on todays visit Atrial fib with rate 76 bpm  Other past medical hx Hospital admission March 2020 acute on chronic diastolic and systolic CHF Presented in atrial fibrillation with RVR Reported having 1 month of shortness of breath Ejection fraction 20 to 25%, was not seen by cardiology  EKG at [facility] showing atrial fibrillation in February 2020  Several trips to the emergency room in February 2020  Echocardiogram March 2020  1. The left ventricle has severely reduced systolic function, with an ejection fraction of 20-25%. The cavity size was moderate to severely dilated. Left  ventricular diastolic function could not be evaluated. Elevated left ventricular end-diastolic  pressure Left ventricular diffuse hypokinesis.  2. Left atrial size was moderately dilated.  3. Right atrial size was mildly dilated.  4. Mitral valve regurgitation is moderate by color flow Doppler.  March 2018 Was noted to be in atrial fibrillation/flutter Converted to normal sinus rhythm in the hospital In the hospital had paroxysmal A. fib/flutter, started on metoprolol CHADS2VASc at least 1 (vascular disease)  Echocardiogram 02/18/2017 - Left ventricle: The cavity size was mildly dilated. The estimated ejection fraction was in the range of 55% to 60%. Wall motion wasnormal - Left atrium: The atrium was mildly dilated.   PMH:   has a past medical history of A-fib (HCC), CHF (congestive heart failure) (HCC), DVT (deep venous thrombosis) (HCC), blood clots, and PE (pulmonary thromboembolism) (HCC).  PSH:    Past Surgical History:  Procedure Laterality Date  . INSERTION OF VENA CAVA FILTER    . PERIPHERAL VASCULAR THROMBECTOMY Right 03/18/2018   Procedure: PERIPHERAL VASCULAR THROMBECTOMY;  Surgeon: Renford Dills, MD;  Location: [facility] INVASIVE CV LAB;  Service: Cardiovascular;  Laterality: Right;    Current Outpatient Medications  Medication Sig Dispense Refill  . furosemide (LASIX) 40 MG tablet Take 1 tablet (40 mg total) by mouth 2 (two) times daily. 180 tablet 3  . metoprolol succinate (TOPROL-XL) 25 MG 24 hr tablet Take 3 tablets (75 mg total) by mouth daily. 180 tablet 3  . rivaroxaban (XARELTO) 20 MG TABS tablet Take 1 tablet (20 mg total) by mouth daily with supper. 90 tablet 3  .  spironolactone (ALDACTONE) 25 MG tablet Take 0.5 tablets (12.5 mg total) by mouth daily. 45 tablet 3   No current facility-administered medications for this visit.     Allergies:   Patient has no known allergies.   Social History:  The patient  reports that he has been smoking cigarettes. He  has been smoking about 0.50 packs per day. He has never used smokeless tobacco. He reports current alcohol use of about 12.0 standard drinks of alcohol per week. He reports previous drug use.   Family History:   family history is not on file.    Review of Systems: Review of Systems  Constitutional: Negative.   HENT: Negative.   Respiratory: Positive for shortness of breath.   Cardiovascular: Negative.   Gastrointestinal: Negative.        Abdominal fullness  Musculoskeletal: Negative.   Neurological: Negative.   Psychiatric/Behavioral: Negative.   All other systems reviewed and are negative.   PHYSICAL EXAM: VS:  There were no vitals taken for this visit. , BMI There is no height or weight on file to calculate BMI. Constitutional:  oriented to person, place, and time. No distress.  HENT:  Head: Grossly normal Eyes:  no discharge. No scleral icterus.  Neck: No JVD, no carotid bruits  Cardiovascular: Regular rate and rhythm, no murmurs appreciated Pulmonary/Chest: Clear to auscultation bilaterally, no wheezes or rails Abdominal: Soft.  no distension.  no tenderness.  Musculoskeletal: Normal range of motion Neurological:  normal muscle tone. Coordination normal. No atrophy Skin: Skin warm and dry Psychiatric: normal affect, pleasant   Recent Labs: 02/21/2019: ALT 23; B Natriuretic Peptide 1,817.0 02/23/2019: Magnesium 1.9 11/10/2019: BUN 19; Creatinine, Ser 1.65; Hemoglobin 16.4; Platelets 274; Potassium 4.5; Sodium 138    Lipid Panel No results found for: CHOL, HDL, LDLCALC, TRIG    Wt Readings from Last 3 Encounters:  11/20/19 219 lb 8 oz (99.6 kg)  11/10/19 217 lb 8 oz (98.7 kg)  07/20/19 205 lb 8 oz (93.2 kg)     ASSESSMENT AND PLAN:  Problem List Items Addressed This Visit    None     Atrial fib, persistent On xarelto Likely causing depressed EF and SOB on exertion Rate relatively well controlled , last week metoprolol dose was increased and losartan was  held -Reports he does not have the finances to perform cardioversion.  He will get back to [place] when this is possible Restoring normal sinus rhythm would likely help his shortness of breath, CHF symptoms and would restore his ejection fraction  Chronic deep vein thrombosis (DVT) of proximal vein of left lower extremity PE, in the past IVC filter was placed  continue chronic anticoagulation Stable   Chronic left hip pain Chronic issue  dislocated in the past Needs follow-up with orthopedics  Tobacco abuse - Plan: EKG 12-Lead Cutting back Recommended smoking cessation  Acute on chronic diastolic and systolic CHF Recommend he moderate his fluid intake Increase Lasix up to 40 twice daily Recent elevated creatinine likely from cardiorenal syndrome  Disposition:   F/U  1 month   Total encounter time more than 25 minutes  Greater than 50% was spent in counseling and coordination of care with the patient   Signed, Esmond Plants, M.D., Ph.D. [place], [place]

## 2019-12-25 ENCOUNTER — Ambulatory Visit: Payer: Self-pay | Admitting: Cardiovascular Disease

## 2019-12-27 ENCOUNTER — Encounter: Payer: Self-pay | Admitting: Cardiovascular Disease

## 2020-01-10 NOTE — Progress Notes (Signed)
Office Visit    Patient Name: Henry Dunn Date of Encounter: 01/11/2020  Primary Care Provider:  [facility] [place] Primary Cardiologist:  Ida Rogue, MD  Chief Complaint    44 year old male with history of atrial flutter/fibrillation on Xarelto, HFrEF, prior DVT/PE in the setting of dislocated hip in 2006 2/2 mechanical fall s/p IVC filter and Coumadin status post possible recurrent left lower extremity DVT versus chronic DVT with development of acute right-sided DVT in 02/2018 s/p thrombectomy in the setting of noncompliance with warfarin now on Xarelto, and tobacco use, and who presents for 1 month follow-up.  Past Medical History    Past Medical History:  Diagnosis Date  . A-fib ([place])   . CHF (congestive heart failure) ([place])   . DVT (deep venous thrombosis) ([place])    a. In setting of dislocated hip, status post IVC filter, previously on Coumadin, noncompliant over the past 12 months  . Hx of blood clots    a. Patient denies any family history of clotting disorder, patient denies prior hypercoagulable workup  . PE (pulmonary thromboembolism) ([place])    Past Surgical History:  Procedure Laterality Date  . INSERTION OF VENA CAVA FILTER    . PERIPHERAL VASCULAR THROMBECTOMY Right 03/18/2018   Procedure: PERIPHERAL VASCULAR THROMBECTOMY;  Surgeon: Katha Cabal, MD;  Location: [place] [facility];  Service: Cardiovascular;  Laterality: Right;    Allergies  No Known Allergies  History of Present Illness    44 year old male with PMH as above.  He has a long history of tachypalpitations.  He was admitted 01/2017 with new onset atrial flutter with RVR.  Echo showed normal EF, mildly dilated left atrium.  His arrhythmia was felt possibly secondary to groin abscess.  He was noted to have left lower extremity DVT.  He was admitted 01/2019 with acute systolic heart failure and EF 20 to 25%, severely dilated LV cavity, diffuse hypokinesis, moderately dilated left  atrium, mild RAE, moderate MR.  He was in A. fib with RVR.  Outside cardiology group was consulted, and he was IV diuresed.  CTA without PE.  Rate control was escalated.  He was seen by his primary cardiologist 03/2019 and started on amiodarone in preparation for restoration of sinus rhythm.  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  When seen in person 3 months later (07/2019), he was still in atrial fibrillation with controlled ventricular response.  BP soft 90/62.  He was without insurance and there were concerns regarding price of potential cardioversion.  He was seen 10/2019 and noted to be up from his baseline weight with abdominal bloating and SOB/DOE symptoms. He was instructed to take an extra lasix for 3 days then reduce back down to his usual dose while holding his ARB 2/2 hypotension. At follow-up one week later, he reported continued abdominal distention and that he had to rest recently 2/2 SOB. He remained in Afib with rate 76bpm. His lasix was increased up to 40mg  BID and it was noted that his recent elevated Cr was likely 2/2 cardiorenal syndrome. It was recommended he follow-up with ortho for his hip pain.  Since his last visit, he reports that he has noticed continued DOE and SOB with minimal activity. He easily will become fatigued with everyday chores. He reports only occasionally noticing racing HR or palpitations. No chest pain. He continues to smoke but is trying to cut back and is down to 1-2 cigarettes a day (sometimes more, sometimes less). He  occasionally drinks alcohol but can drink 6-8 and sometimes 12 beers at a time when he does drink. He is unable to remain very active given his atrial fibrillation and SOB. He occasionally checks his HR and SpO2 with a pulse ox at home. He also occasionally weighs himself. He states he is working to try and get insurance coverage for DCCV and may try to cover it out of pocket if possible. No presyncope or LOC. No abdominal distention.  Reports chronic LLE edema unchanged from baseline since LLE DVT. Reports chronic L hip pain. No significant SOB at rest since his last hospitalization (only with mild activity). He is taking his medication as prescribed (with ARB held given the need to increase his BB for his Afib). No s/sx of bleeding.  Home Medications    Prior to Admission medications   Medication Sig Start Date End Date Taking? Authorizing Provider  furosemide (LASIX) 40 MG tablet Take 1 tablet (40 mg total) by mouth 2 (two) times daily. 11/20/19   Antonieta Iba, MD  metoprolol succinate (TOPROL-XL) 25 MG 24 hr tablet Take 3 tablets (75 mg total) by mouth daily. 11/10/19   Dunn, Raymon Mutton, PA-C  rivaroxaban (XARELTO) 20 MG TABS tablet Take 1 tablet (20 mg total) by mouth daily with supper. 04/11/19   Antonieta Iba, MD  spironolactone (ALDACTONE) 25 MG tablet Take 0.5 tablets (12.5 mg total) by mouth daily. 04/11/19 04/10/20  Antonieta Iba, MD    Review of Systems    He denies chest pain, pnd, orthopnea, n, v, dizziness, edema, weight gain, or early satiety. He reports occasional racing HR and palpitations. He has SOB/DOE with minimal activity. No presyncope or syncope.  All other systems reviewed and are otherwise negative except as noted above.  Physical Exam    VS:  BP 110/80 (BP Location: Left Arm, Patient Position: Sitting, Cuff Size: Normal)   Pulse 99   Ht 5\' 8"  (1.727 m)   Wt 213 lb 12 oz (97 kg)   SpO2 98%   BMI 32.50 kg/m  , BMI Body mass index is 32.5 kg/m. GEN: Well nourished, well developed, in no acute distress. HEENT: normal. Neck: Supple, no JVD, carotid bruits, or masses. Cardiac: IRIR, 1/6 systolic murmurs. No rubs, or gallops. No clubbing, cyanosis, LLE>RLE edema but nonpitting and reported as chronic.  Radials/DP/PT 2+ and equal bilaterally.  Respiratory:  Respirations regular and unlabored, RLL with trace crackles at the base. Otherwise CTAB GI: Soft, nontender, nondistended, BS + x  4. MS: no deformity or atrophy. Skin: warm and dry, no rash. Neuro:  Strength and sensation are intact. Psych: Normal affect.  Accessory Clinical Findings    ECG personally reviewed by me today - atrial fibrillation, 99bpm, baseline wander- no acute changes.  Filed Weights   01/11/20 1420  Weight: 213 lb 12 oz (97 kg)     11/10/2019 labs Sodium 138, potassium 4.5, creatinine 1.65 (elevated from 1.16), BUN 19 (elevated from 18), calcium 9.8, WBC 7.5, RBC 5.11, hemoglobin 16.4, hematocrit 48.6, platelets 274.  Echo 02/22/2019 1. The left ventricle has severely reduced systolic function, with an  ejection fraction of 20-25%. The cavity size was moderate to severely  dilated. Left ventricular diastolic function could not be evaluated.  Elevated left ventricular end-diastolic  pressure Left ventricular diffuse hypokinesis.  2. Left atrial size was moderately dilated.  3. Right atrial size was mildly dilated.  4. Mitral valve regurgitation is moderate by color flow Doppler.   CTA  02/21/2019 IMPRESSION: 1. Negative for acute pulmonary embolus. 2. Cardiomegaly which may have mildly progressed since February. Persistent reactive appearing mediastinal lymph nodes. Increased small layering pleural effusions. Subtle generalized ground-glass opacity. New small volume ascites in the upper abdomen. This constellation favors interval volume overload, mild pulmonary edema, mild congestive heart failure. 3. New mild to moderate fat stranding about the gallbladder is nonspecific but favored due to #2 in the absence of acute right upper quadrant pain. Follow-up Right Upper Quadrant Ultrasound may be valuable in this clinical setting. 4. Calcified coronary artery atherosclerosis.  Assessment & Plan    Persistent Atrial Fibrillation --Remains in Afib and symptomatic. Suspect ongoing Afib is contributing to SOB/DOE and fatigue. CHA2DS2VASc score of at least 3 (CHF, HTN, vascular).  Continue OAC with Xarelto. No s/sx of bleeding. Continue rate control with BB. Currently, ventricular rate controlled with most recent increase of metoprolol. Continue to hold Losartan due to low pressure with escalation of BB. He is working to [employer] and cover the cardioversion and will call us when he has a better idea of when this can occur. As previously noted, recommendation is for restoration of NSR sooner than later, given it is harder to restore NSR the longer one is in Afib. Recommend updated limited echo 3 months after restoration of NSR and optimized GDMT to reassess EF as below. Risk factor modification discussed.  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 Recheck BMET to ensure stable renal function and electrolytes. Suspect his dry weight is likely around 205lbs with today's weight 213lbs.Discussed sliding scale diuresis with patient agreement to adjust diuresis dependent on weight and pending recheck of BMET. Continue to hold Losartan given soft BP and to avoid AKI. Discussed low salt diet and intake of fluids, as well as tobacco and alcohol use.  Discussed importance of daily weights and monitoring his BP. Once back in NSR, and if room in BP with stable renal function, consider transition from Losartan to [facility]. Continue spironolactone. As above, plan for limited echo with restoration of  NSR and optimized medical therapy, as will need to consider ICD if his EF does not improve following GDMT and return of NSR.   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 Periodic CBC. No s/sx of bleeding. Followed by vascular surgery. Reports chronic LLE edema 2/2 DVT. Chronic L hip pain 2/2 dislocation. Consider orthopedics follow-up.   Tobacco use / Alcohol use --Complete cessation  advised. Continue to work on quitting alcohol, tobacco.  ---- Disposition: Obtain BMET with further recommendations regarding diuresis (sliding scale lasix) at that time. He will call us once able to schedule DCCV. Continue to hold Losartan for now with softer pressure. Obtain limited echo once restore NSR and optimized GDMT.  Lennon Alstrom, PA-C 01/11/2020, 5:56 PM

## 2020-01-11 ENCOUNTER — Other Ambulatory Visit: Payer: Self-pay

## 2020-01-11 ENCOUNTER — Ambulatory Visit (INDEPENDENT_AMBULATORY_CARE_PROVIDER_SITE_OTHER): Payer: Self-pay | Admitting: Physician Assistant

## 2020-01-11 ENCOUNTER — Encounter: Payer: Self-pay | Admitting: Physician Assistant

## 2020-01-11 VITALS — BP 110/80 | HR 99 | Ht 68.0 in | Wt 213.8 lb

## 2020-01-11 DIAGNOSIS — I5022 Chronic systolic (congestive) heart failure: Secondary | ICD-10-CM

## 2020-01-11 DIAGNOSIS — R0602 Shortness of breath: Secondary | ICD-10-CM

## 2020-01-11 DIAGNOSIS — I420 Dilated cardiomyopathy: Secondary | ICD-10-CM

## 2020-01-11 DIAGNOSIS — Z86718 Personal history of other venous thrombosis and embolism: Secondary | ICD-10-CM

## 2020-01-11 DIAGNOSIS — F17200 Nicotine dependence, unspecified, uncomplicated: Secondary | ICD-10-CM

## 2020-01-11 DIAGNOSIS — I4819 Other persistent atrial fibrillation: Secondary | ICD-10-CM

## 2020-01-11 NOTE — Patient Instructions (Addendum)
Medication Instructions:  No changes  *If you need a refill on your cardiac medications before your next appointment, please call your pharmacy*  Lab Work: BMET done today  If you have labs (blood work) drawn today and your tests are completely normal, you will receive your results only by: Marland Kitchen MyChart Message (if you have MyChart) OR . A paper copy in the mail If you have any lab test that is abnormal or we need to change your treatment, we will call you to review the results.  Testing/Procedures: None   Follow-Up: At [facility], you and your health needs are our priority.  As part of our continuing mission to provide you with exceptional heart care, we have created designated Provider Care Teams.  These Care Teams include your primary Cardiologist (physician) and Advanced Practice Providers (APPs -  Physician Assistants and Nurse Practitioners) who all work together to provide you with the care you need, when you need it.  Your next appointment:   3 month(s)  The format for your next appointment:   In Person  Provider:    You may see Julien Nordmann, MD or one of the following Advanced Practice Providers on your designated Care Team:    Nicolasa Ducking, NP  Eula Listen, PA-C  Marisue Ivan, PA-C    Normally, the financial/charity care department requires a $5,000 balance for patients to be eligible but they will also offer a financial consoler to assist the patient with getting Medicaid insurance again.    Medicaid and patient accounting handles charity. The patient accounting number is [phone].

## 2020-01-12 ENCOUNTER — Telehealth: Payer: Self-pay | Admitting: Physician Assistant

## 2020-01-12 LAB — BASIC METABOLIC PANEL
BUN/Creatinine Ratio: 10 (ref 9–20)
BUN: 11 mg/dL (ref 6–24)
CO2: 21 mmol/L (ref 20–29)
Calcium: 9.7 mg/dL (ref 8.7–10.2)
Chloride: 101 mmol/L (ref 96–106)
Creatinine, Ser: 1.15 mg/dL (ref 0.76–1.27)
GFR calc Af Amer: 90 mL/min/{1.73_m2} (ref >59)
GFR calc non Af Amer: 78 mL/min/{1.73_m2} (ref >59)
Glucose: 88 mg/dL (ref 65–99)
Potassium: 4.2 mmol/L (ref 3.5–5.2)
Sodium: 139 mmol/L (ref 134–144)

## 2020-01-12 NOTE — Telephone Encounter (Signed)
Lennon Alstrom, PA-C  01/12/2020 11:23 AM EST    Please let Mr. Cerda know that his renal function has improved from his previous labs and with his increased dose lasix to 40mg  twice daily. As previously noted, his previous labs with slightly decreased kidney function was likely due to holding on to fluid. Recommend he continue daily weights. If weight gain is greater than 5 pounds from that of baseline with associated shortness of breath, take an extra lasix x1 for the next 1-2 days. If sx persist despite taking this additional lasix for 1-2 days, contact the office for further recommendations

## 2020-01-12 NOTE — Telephone Encounter (Signed)
Attempted to call the patient. No answer- I left a message to please call back.  

## 2020-01-15 NOTE — Telephone Encounter (Signed)
Results called to pt. Pt verbalized understanding. He verbalized understanding of the results and plan of care in regard to taking furosemide for weight gain greater than 5 pounds with associated shortness of breath for 1-2 days. He is aware to call if it does not improve.

## 2020-02-26 ENCOUNTER — Telehealth: Payer: Self-pay | Admitting: Pharmacy Technician

## 2020-02-26 NOTE — Telephone Encounter (Signed)
Patient recently approved for SS Disability.  Patient needs to provide [facility] a copy of that statement along with his savings account statement for 2021.  Sherilyn Dacosta Care Manager [facility]

## 2020-03-20 ENCOUNTER — Telehealth: Payer: Self-pay | Admitting: Pharmacy Technician

## 2020-03-20 NOTE — Telephone Encounter (Signed)
Received updated proof of income.  Patient eligible to receive medication assistance at [facility] until May 30, 2020.  Patient will have Medicare beginning May 30, 2020.  Provided information about Medicare Savings Plan and L.I.S.   Sherilyn Dacosta Care Manager [facility]

## 2020-03-25 ENCOUNTER — Other Ambulatory Visit: Payer: Self-pay | Admitting: Cardiovascular Disease

## 2020-04-08 NOTE — Progress Notes (Signed)
Cardiology Office Note  Date:  04/09/2020   ID:  Henry Dunn, DOB 07/03/1976, MRN [id number]  PCP:  [facility] [place]   Chief Complaint  Patient presents with  . OTHER    3 month f/u no complaints today. Meds reviewed verbally with pt.    HPI:  Mr. Henry Dunn is a 44 year old gentleman with past medical history of Left hip dislocation  right lower extremity DVT in the past, had IVC filter Chronic smoker, shortness of breath developed atrial fibrillation with RVR February 23 2017 after having groin abscess debrided, started on warfarin RT leg thrombectomy    DVT April 2019, noncompliance with warfarin IVC filter in place Echo 01/2019 left ventricle has severely reduced systolic function, with an ejection fraction of 20-25%. Atrial fibrillation starting early 2019 He presents today for routine follow-up of his atrial fibrillation  Seen in clinic 01/11/2020, continued dyspnea on exertion shortness of breath Also reported having fatigue Smoking, alcohol 6-8 beers sometimes up to 12  Weight down 8-9 pounds Feels baseline weight 210, down from 219  Severe left hip pain  Reports he is taking Lasix 40 daily Sometimes extra lasix in the PM, for ABD bloating  on Xarelto  Chronic mild left leg swelling from prior DVT   Reports he has Medicare Medicaid, would like to get his hip fixed  EKG personally reviewed by myself on todays visit Atrial fib with rate 94 bpm  Other past medical hx Hospital admission March 2020 acute on chronic diastolic and systolic CHF Presented in atrial fibrillation with RVR Reported having 1 month of shortness of breath Ejection fraction 20 to 25%, was not seen by cardiology  EKG at [facility] showing atrial fibrillation in February 2020  Several trips to the emergency room in February 2020  Echocardiogram March 2020  1. The left ventricle has severely reduced systolic function, with an ejection fraction of 20-25%. The cavity size was moderate to  severely dilated. Left ventricular diastolic function could not be evaluated. Elevated left ventricular end-diastolic  pressure Left ventricular diffuse hypokinesis.  2. Left atrial size was moderately dilated.  3. Right atrial size was mildly dilated.  4. Mitral valve regurgitation is moderate by color flow Doppler.  March 2018 Was noted to be in atrial fibrillation/flutter Converted to normal sinus rhythm in the hospital In the hospital had paroxysmal A. fib/flutter, started on metoprolol CHADS2VASc at least 1 (vascular disease)  Echocardiogram 02/18/2017 - Left ventricle: The cavity size was mildly dilated. The estimated ejection fraction was in the range of 55% to 60%. Wall motion wasnormal - Left atrium: The atrium was mildly dilated.   PMH:   has a past medical history of A-fib ([place]), CHF (congestive heart failure) ([place]), DVT (deep venous thrombosis) ([place]), blood clots, and PE (pulmonary thromboembolism) ([place]).  PSH:    Past Surgical History:  Procedure Laterality Date  . INSERTION OF VENA CAVA FILTER    . PERIPHERAL VASCULAR THROMBECTOMY Right 03/18/2018   Procedure: PERIPHERAL VASCULAR THROMBECTOMY;  Surgeon: Katha Cabal, MD;  Location: [place] [facility];  Service: Cardiovascular;  Laterality: Right;    Current Outpatient Medications  Medication Sig Dispense Refill  . furosemide (LASIX) 40 MG tablet Take 1 tablet (40 mg total) by mouth 2 (two) times daily. 180 tablet 3  . metoprolol succinate (TOPROL-XL) 25 MG 24 hr tablet Take 3 tablets (75 mg total) by mouth daily. 180 tablet 3  . rivaroxaban (XARELTO) 20 MG TABS tablet Take 1 tablet (20 mg total)  by mouth daily with supper. 90 tablet 3  . spironolactone (ALDACTONE) 25 MG tablet TAKE 1/2 TABLET BY MOUTH EVERY DAY 45 tablet 0   No current facility-administered medications for this visit.    Allergies:   Patient has no known allergies.   Social History:  The patient  reports that he has been smoking  cigarettes. He has been smoking about 0.25 packs per day. He has never used smokeless tobacco. He reports current alcohol use of about 12.0 standard drinks of alcohol per week. He reports previous drug use.   Family History:   family history is not on file.    Review of Systems: Review of Systems  Constitutional: Negative.   HENT: Negative.   Respiratory: Positive for shortness of breath.   Cardiovascular: Negative.   Gastrointestinal: Negative.        Abdominal fullness  Musculoskeletal: Negative.   Neurological: Negative.   Psychiatric/Behavioral: Negative.   All other systems reviewed and are negative.   PHYSICAL EXAM: VS:  BP 100/80 (BP Location: Left Arm, Patient Position: Sitting, Cuff Size: Normal)   Pulse 94   Ht 5\' 8"  (1.727 m)   Wt 211 lb 2 oz (95.8 kg)   SpO2 98%   BMI 32.10 kg/m  , BMI Body mass index is 32.1 kg/m. Constitutional:  oriented to person, place, and time. No distress.  HENT:  Head: Grossly normal Eyes:  no discharge. No scleral icterus.  Neck: No JVD, no carotid bruits  Cardiovascular: Irregularly irregular, no murmurs appreciated Pulmonary/Chest: Clear to auscultation bilaterally, no wheezes or rails Abdominal: Soft.  no distension.  no tenderness.  Musculoskeletal: Normal range of motion Neurological:  normal muscle tone. Coordination normal. No atrophy Skin: Skin warm and dry Psychiatric: normal affect, pleasant   Recent Labs: 11/10/2019: Hemoglobin 16.4; Platelets 274 01/11/2020: BUN 11; Creatinine, Ser 1.15; Potassium 4.2; Sodium 139    Lipid Panel No results found for: CHOL, HDL, LDLCALC, TRIG    Wt Readings from Last 3 Encounters:  04/09/20 211 lb 2 oz (95.8 kg)  01/11/20 213 lb 12 oz (97 kg)  11/20/19 219 lb 8 oz (99.6 kg)     ASSESSMENT AND PLAN:  Problem List Items Addressed This Visit      Cardiology Problems   Typical atrial flutter (HCC)     Other   Smoker    Other Visit Diagnoses    Persistent atrial  fibrillation with RVR (HCC)    -  Primary   Relevant Orders   EKG 12-Lead   Chronic systolic heart failure (HCC)       Dilated cardiomyopathy (HCC)       History of DVT (deep vein thrombosis)       History of pulmonary embolism         Atrial fib, persistent On Xarelto, rate controlled on metoprolol Has been in atrial fibrillation past 2 years, Previously declined cardioversion secondary to psychosocial issues Echocardiogram has been ordered Would likely have difficult time restoring normal sinus rhythm  Chronic deep vein thrombosis (DVT) of proximal vein of left lower extremity PE, in the past IVC filter was placed  Thrombus in the lower extremities treated by vascular  Chronic left hip pain Chronic issue  dislocated in the past We have referred him to orthopedics  Smoker We have encouraged him to continue to work on weaning his cigarettes and smoking cessation. He will continue to work on this and does not want any assistance with chantix.   Acute on chronic  diastolic and systolic CHF Echocardiogram ordered Previous EF 25% Continue current medications, Low blood pressure limiting titration of other medications  Disposition:   F/U  6 month   Total encounter time more than 25 minutes  Greater than 50% was spent in counseling and coordination of care with the patient   Signed, Dossie Arbour, M.D., Ph.D. [facility] [place], [place] [phone]

## 2020-04-09 ENCOUNTER — Ambulatory Visit (INDEPENDENT_AMBULATORY_CARE_PROVIDER_SITE_OTHER): Payer: Medicaid Other | Admitting: Cardiovascular Disease

## 2020-04-09 ENCOUNTER — Other Ambulatory Visit: Payer: Self-pay

## 2020-04-09 ENCOUNTER — Encounter: Payer: Self-pay | Admitting: Cardiovascular Disease

## 2020-04-09 VITALS — BP 100/80 | HR 94 | Ht 68.0 in | Wt 211.1 lb

## 2020-04-09 DIAGNOSIS — I5022 Chronic systolic (congestive) heart failure: Secondary | ICD-10-CM | POA: Diagnosis not present

## 2020-04-09 DIAGNOSIS — Z86718 Personal history of other venous thrombosis and embolism: Secondary | ICD-10-CM

## 2020-04-09 DIAGNOSIS — F17200 Nicotine dependence, unspecified, uncomplicated: Secondary | ICD-10-CM | POA: Diagnosis not present

## 2020-04-09 DIAGNOSIS — I420 Dilated cardiomyopathy: Secondary | ICD-10-CM | POA: Diagnosis not present

## 2020-04-09 DIAGNOSIS — M25559 Pain in unspecified hip: Secondary | ICD-10-CM

## 2020-04-09 DIAGNOSIS — I4819 Other persistent atrial fibrillation: Secondary | ICD-10-CM

## 2020-04-09 DIAGNOSIS — Z86711 Personal history of pulmonary embolism: Secondary | ICD-10-CM

## 2020-04-09 DIAGNOSIS — I483 Typical atrial flutter: Secondary | ICD-10-CM

## 2020-04-09 NOTE — Patient Instructions (Addendum)
Referral to orthopedics Placed for Dr. Ernest Pine at [facility] [phone] ask for ortho department.   Medication Instructions:  No changes  If you need a refill on your cardiac medications before your next appointment, please call your pharmacy.    Lab work: No new labs needed   If you have labs (blood work) drawn today and your tests are completely normal, you will receive your results only by: Marland Kitchen MyChart Message (if you have MyChart) OR . A paper copy in the mail If you have any lab test that is abnormal or we need to change your treatment, we will call you to review the results.   Testing/Procedures: Echo for cardiomyopathy, atrial fib Your physician has requested that you have an echocardiogram. Echocardiography is a painless test that uses sound waves to create images of your heart. It provides your doctor with information about the size and shape of your heart and how well your heart's chambers and valves are working. This procedure takes approximately one hour. There are no restrictions for this procedure.     Follow-Up: At [facility], you and your health needs are our priority.  As part of our continuing mission to provide you with exceptional heart care, we have created designated Provider Care Teams.  These Care Teams include your primary Cardiologist (physician) and Advanced Practice Providers (APPs -  Physician Assistants and Nurse Practitioners) who all work together to provide you with the care you need, when you need it.  . You will need a follow up appointment in 6 months .  Marland Kitchen Providers on your designated Care Team:   . Nicolasa Ducking, NP . Eula Listen, PA-C . Marisue Ivan, PA-C  Any Other Special Instructions Will Be Listed Below (If Applicable).  For educational health videos Log in to : [email or web address] Or : [email or web address], password : [email or web address]

## 2020-04-19 ENCOUNTER — Other Ambulatory Visit: Payer: Medicaid Other

## 2020-04-23 ENCOUNTER — Other Ambulatory Visit: Payer: Self-pay | Admitting: *Deleted

## 2020-04-23 DIAGNOSIS — M25559 Pain in unspecified hip: Secondary | ICD-10-CM

## 2020-05-09 ENCOUNTER — Other Ambulatory Visit: Payer: Self-pay | Admitting: Cardiovascular Disease

## 2020-05-09 NOTE — Telephone Encounter (Signed)
Refill request

## 2020-05-09 NOTE — Telephone Encounter (Signed)
Age 44, weight 96kg, SCr 1.15, CrCl >100, afib indication, last OV May 2021

## 2020-05-28 ENCOUNTER — Other Ambulatory Visit: Payer: Medicaid Other

## 2020-06-05 ENCOUNTER — Other Ambulatory Visit: Payer: Medicaid Other

## 2020-06-07 ENCOUNTER — Other Ambulatory Visit: Payer: Medicaid Other

## 2020-06-13 IMAGING — CT CT ANGIO CHEST
2 of 6 series · 18 of 46 positions shown · IV contrast (APPLIED)
Comparison: 03/14/2018

CLINICAL DATA: Shortness of breath

EXAM:
CT ANGIOGRAPHY CHEST WITH CONTRAST
TECHNIQUE: Multidetector CT imaging of the chest was performed using the
standard protocol during bolus administration of intravenous
contrast. Multiplanar CT image reconstructions and MIPs were
obtained to evaluate the vascular anatomy.
CONTRAST:  100mL OMNIPAQUE IOHEXOL 350 MG/ML SOLN

[Series 5: thins · axial · 0.79mm/px · z∈[-586,-266]mm · 15 of 352 slices shown]
[im 16/352  lung]
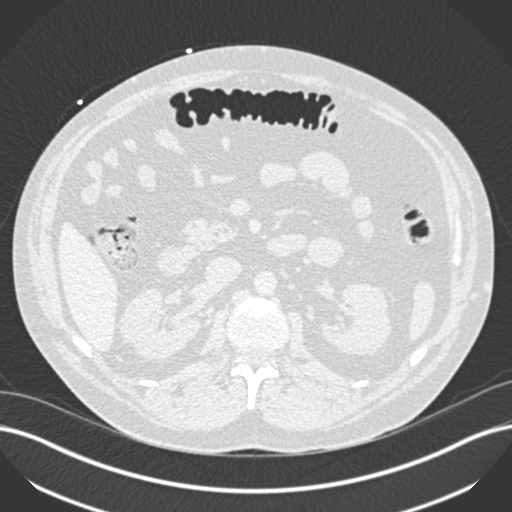
[im 46/352  soft-tissue]
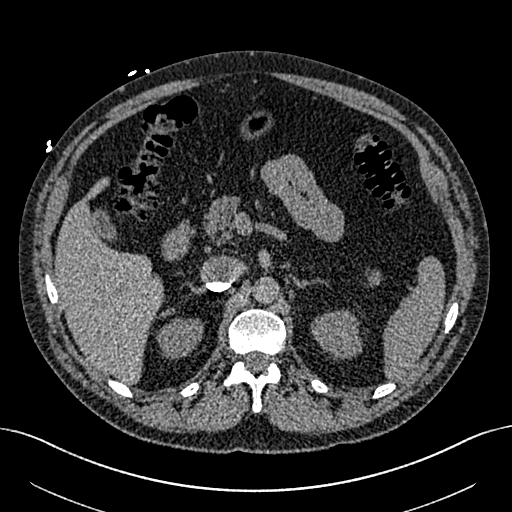
[im 62/352  lung]
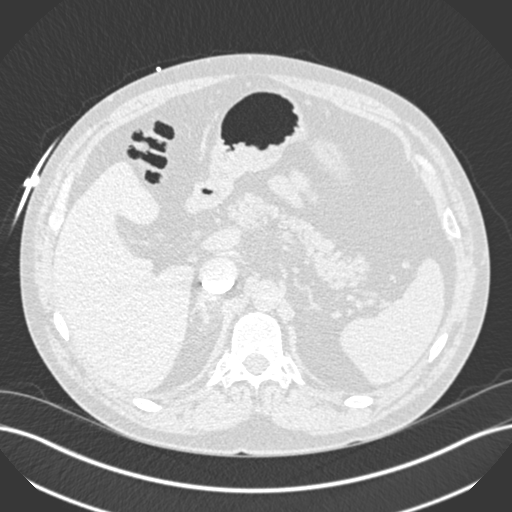
[im 92/352  soft-tissue]
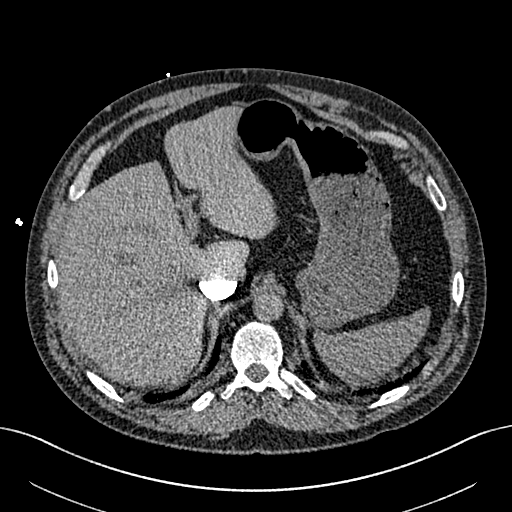
[im 107/352  lung]
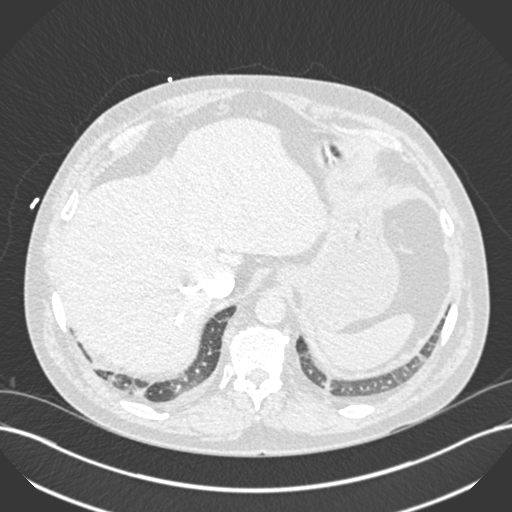
[im 138/352  soft-tissue]
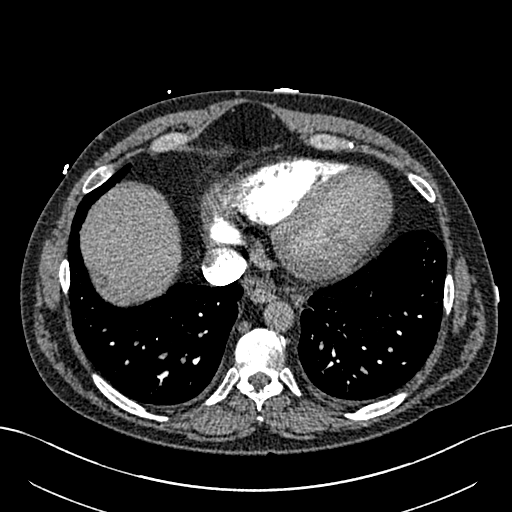
[im 153/352  lung]
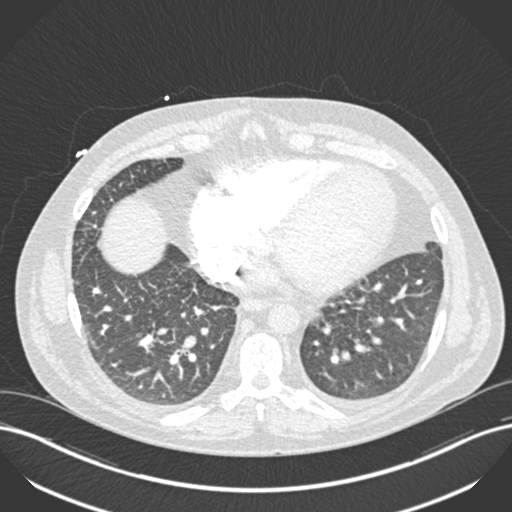
[im 184/352  soft-tissue]
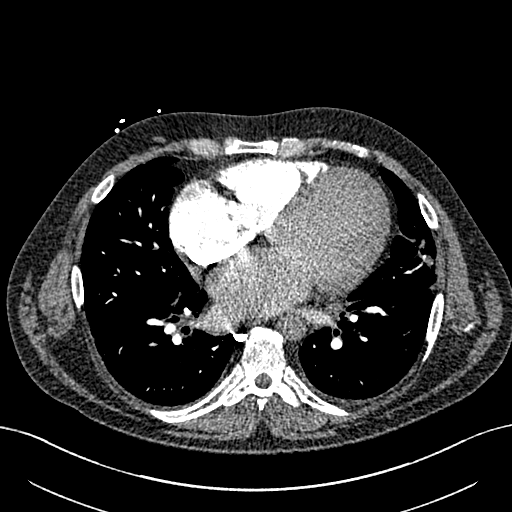
[im 199/352  lung]
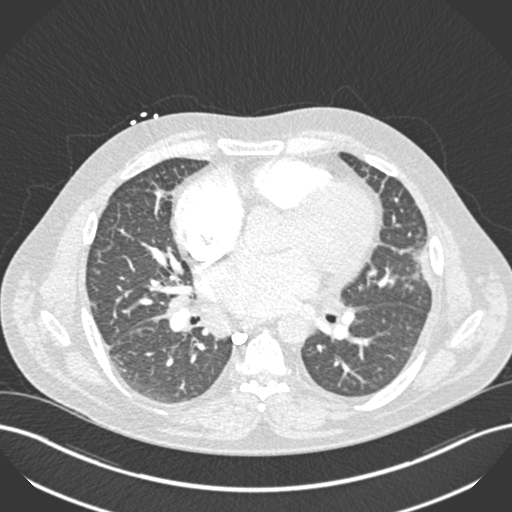
[im 214/352  soft-tissue]
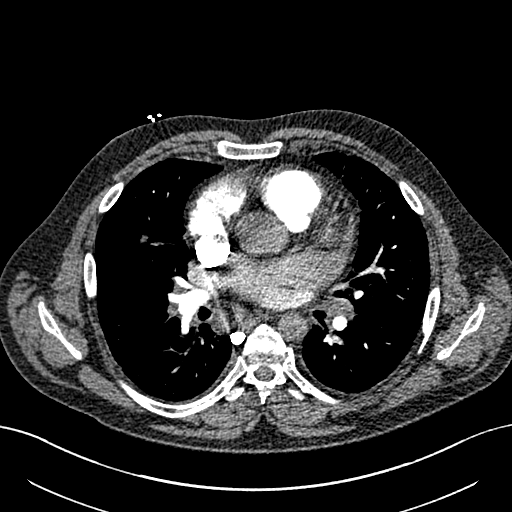
[im 245/352  lung]
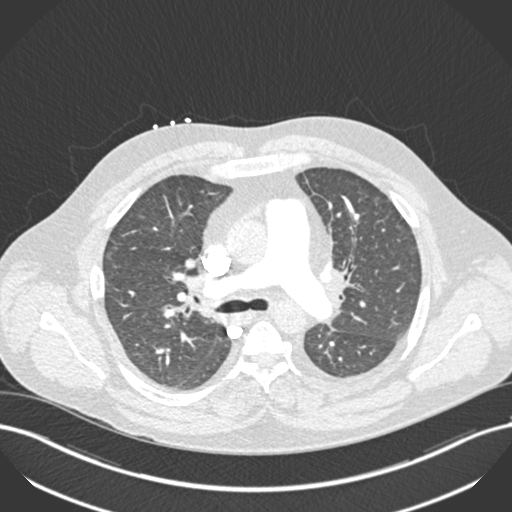
[im 260/352  soft-tissue]
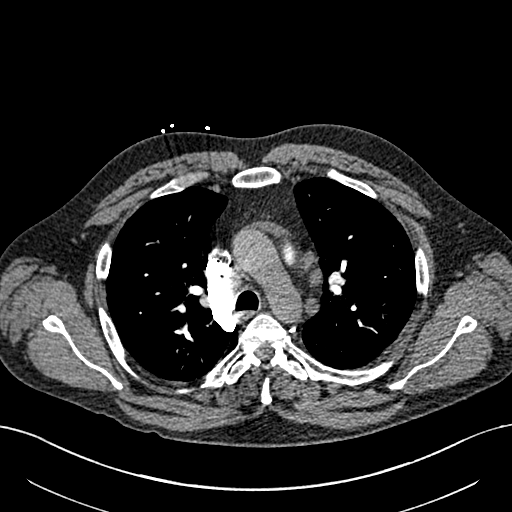
[im 290/352  lung]
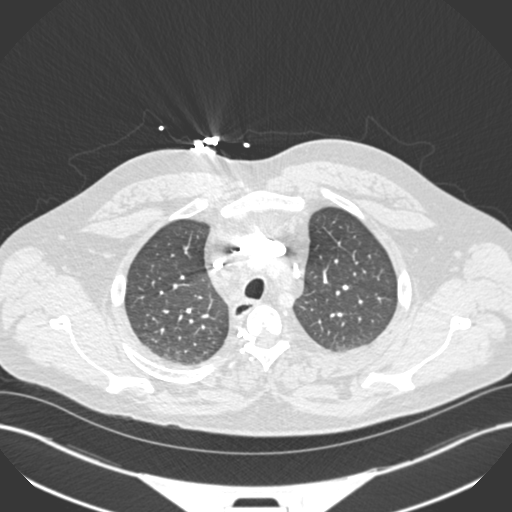
[im 306/352  soft-tissue]
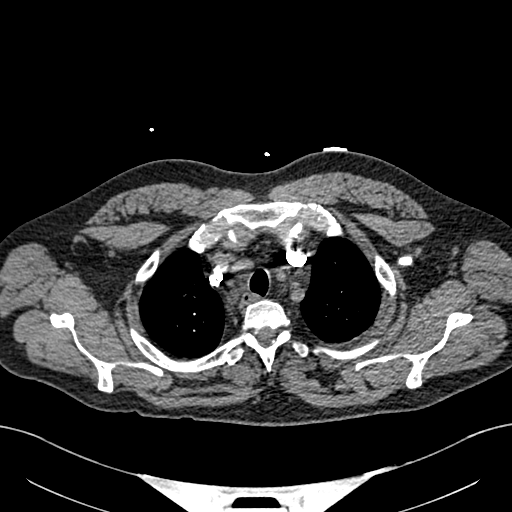
[im 336/352  lung]
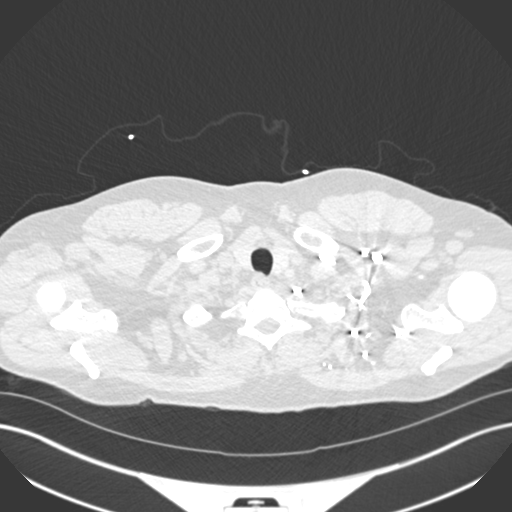

[Series 7: coronal mpr · coronal · 0.75mm/px · 3 of 87 slices shown]
[im 22/87  soft-tissue]
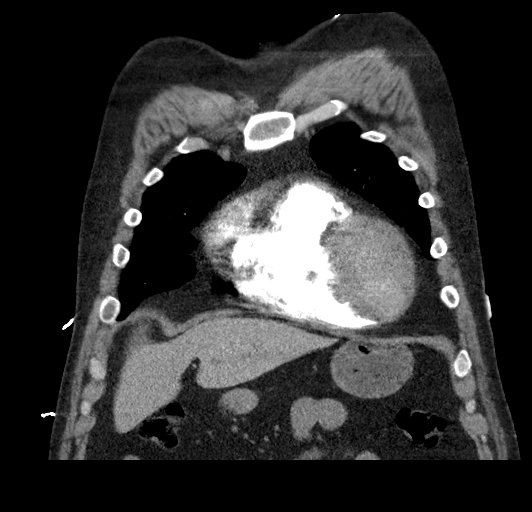
[im 44/87  soft-tissue]
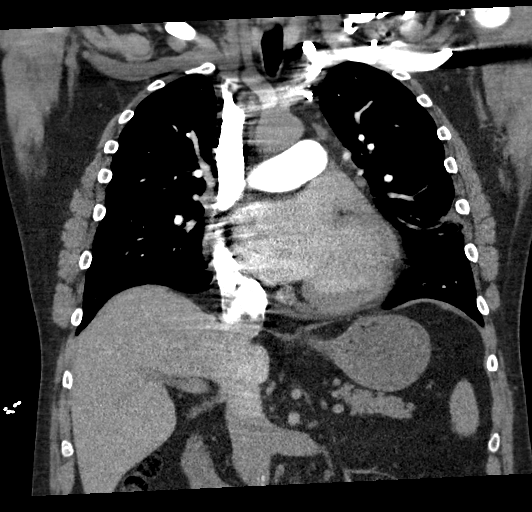
[im 65/87  soft-tissue]
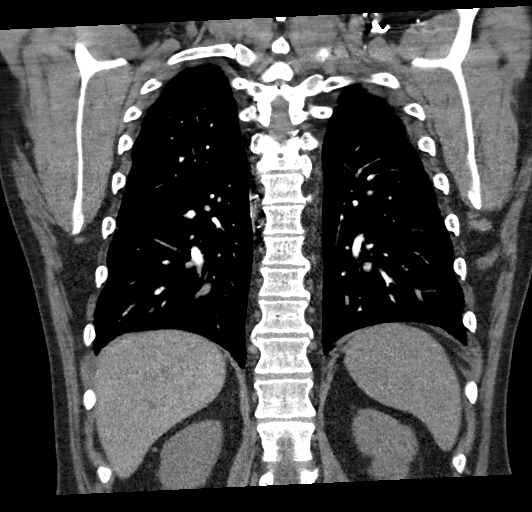

[18 of 46 positions shown; findings below may reference images not displayed]

FINDINGS: Cardiovascular: No filling defects in the pulmonary arteries to
suggest pulmonary emboli. Cardiomegaly. Aorta is normal caliber.

Mediastinum/Nodes: Mildly enlarged mediastinal lymph nodes. Right
paratracheal lymph node has a short axis diameter of 11 mm.
Prevascular lymph node has a short axis diameter of 10 mm. Mild
bilateral hilar adenopathy with index left hilar lymph node having a
short axis diameter of 15 mm and right hilar lymph node short axis
diameter of 13 mm. No axillary adenopathy.

Lungs/Pleura: Patchy opacity peripherally in the lingula, stable
since prior study most compatible with scarring.. Ground-glass
nodule in the inferior right upper lobe along the minor fissure.
Linear bibasilar atelectasis. No effusions.

Upper Abdomen: Imaging into the upper abdomen shows no acute
findings.

Musculoskeletal: Chest wall soft tissues are unremarkable. No acute
bony abnormality.

Review of the MIP images confirms the above findings.
IMPRESSION: Cardiomegaly.  No evidence of pulmonary embolus.

Mild bilateral hilar and mediastinal adenopathy. These have enlarged
since prior study.

Scarring in the lingula.

9 mm mixed solid and subsolid nodule in the inferior right upper
lobe along the minor fissure. Follow-up non-contrast CT recommended
at 3-6 months to confirm persistence. If unchanged, and solid
component remains <6 mm, annual CT is recommended until 5 years of
stability has been established. If persistent these nodules should
be considered highly suspicious if the solid component of the nodule
is 6 mm or greater in size and enlarging. This recommendation
follows the consensus statement: Guidelines for Management of
Incidental Pulmonary Nodules Detected on CT Images: From the

Bibasilar atelectasis.

## 2020-06-17 ENCOUNTER — Ambulatory Visit (INDEPENDENT_AMBULATORY_CARE_PROVIDER_SITE_OTHER): Payer: Medicare Other | Admitting: Nurse Practitioner

## 2020-06-17 ENCOUNTER — Encounter (INDEPENDENT_AMBULATORY_CARE_PROVIDER_SITE_OTHER): Payer: Self-pay | Admitting: Nurse Practitioner

## 2020-06-17 ENCOUNTER — Other Ambulatory Visit: Payer: Self-pay

## 2020-06-17 VITALS — BP 125/86 | HR 92 | Resp 16 | Wt 208.0 lb

## 2020-06-17 DIAGNOSIS — I8291 Chronic embolism and thrombosis of unspecified vein: Secondary | ICD-10-CM

## 2020-06-17 DIAGNOSIS — I890 Lymphedema, not elsewhere classified: Secondary | ICD-10-CM

## 2020-06-17 DIAGNOSIS — F17200 Nicotine dependence, unspecified, uncomplicated: Secondary | ICD-10-CM | POA: Diagnosis not present

## 2020-06-18 ENCOUNTER — Encounter (INDEPENDENT_AMBULATORY_CARE_PROVIDER_SITE_OTHER): Payer: Self-pay | Admitting: Nurse Practitioner

## 2020-06-18 NOTE — Progress Notes (Signed)
Subjective:    Patient ID: Henry Dunn, male    DOB: 07/26/1976, 44 y.o.   MRN: [id number] Chief Complaint  Patient presents with  . Follow-up    70yr follow up    The patient returns to the office for followup evaluation regarding leg swelling.  The swelling has persisted and the pain associated with swelling continues. There have not been any interval development of a ulcerations or wounds.  Since the previous visit the patient has been wearing graduated compression stockings and has noted little if any improvement in the lymphedema. The patient has been using compression routinely morning until night.  The patient also states elevation during the day and exercise is being done too.    Review of Systems  Cardiovascular: Positive for leg swelling.  Musculoskeletal: Positive for arthralgias, back pain, gait problem and myalgias.  Skin: Positive for rash.  Neurological: Positive for weakness.  All other systems reviewed and are negative.      Objective:   Physical Exam Vitals reviewed.  HENT:     Head: Normocephalic.  Cardiovascular:     Rate and Rhythm: Normal rate and regular rhythm.     Pulses: Normal pulses.     Heart sounds: Normal heart sounds.  Pulmonary:     Effort: Pulmonary effort is normal.     Breath sounds: Normal breath sounds.  Musculoskeletal:     Right lower leg: 2+ Edema present.     Left lower leg: 2+ Edema present.  Skin:    General: Skin is warm and dry.     Findings: Rash (Stasis dermatitis) present.     Comments: Dermal thickening with left greater than right  Neurological:     Mental Status: He is alert and oriented to person, place, and time.     Motor: Weakness present.     Gait: Gait abnormal.  Psychiatric:        Mood and Affect: Mood normal.        Behavior: Behavior normal.        Thought Content: Thought content normal.        Judgment: Judgment normal.     BP 125/86 (BP Location: Right Arm)   Pulse 92   Resp 16   Wt 208 lb  (94.3 kg)   BMI 31.63 kg/m   Past Medical History:  Diagnosis Date  . A-fib (HCC)   . CHF (congestive heart failure) (HCC)   . DVT (deep venous thrombosis) (HCC)    a. In setting of dislocated hip, status post IVC filter, previously on Coumadin, noncompliant over the past 12 months  . Hx of blood clots    a. Patient denies any family history of clotting disorder, patient denies prior hypercoagulable workup  . PE (pulmonary thromboembolism) (HCC)     Social History   Socioeconomic History  . Marital status: Single    Spouse name: Not on file  . Number of children: Not on file  . Years of education: Not on file  . Highest education level: Not on file  Occupational History  . Not on file  Tobacco Use  . Smoking status: Current Every Day Smoker    Packs/day: 0.25    Types: Cigarettes  . Smokeless tobacco: Never Used  Vaping Use  . Vaping Use: Never used  Substance and Sexual Activity  . Alcohol use: Yes    Alcohol/week: 12.0 standard drinks    Types: 12 Cans of beer per week  . Drug use: Not Currently  .  Sexual activity: Not on file  Other Topics Concern  . Not on file  Social History Narrative  . Not on file   Social Determinants of Health   Financial Resource Strain:   . Difficulty of Paying Living Expenses:   Food Insecurity:   . Worried About Programme researcher, broadcasting/film/video in the Last Year:   . Barista in the Last Year:   Transportation Needs:   . Freight forwarder (Medical):   Marland Kitchen Lack of Transportation (Non-Medical):   Physical Activity:   . Days of Exercise per Week:   . Minutes of Exercise per Session:   Stress:   . Feeling of Stress :   Social Connections:   . Frequency of Communication with Friends and Family:   . Frequency of Social Gatherings with Friends and Family:   . Attends Religious Services:   . Active Member of Clubs or Organizations:   . Attends Banker Meetings:   Marland Kitchen Marital Status:   Intimate Partner Violence:   . Fear  of Current or Ex-Partner:   . Emotionally Abused:   Marland Kitchen Physically Abused:   . Sexually Abused:     Past Surgical History:  Procedure Laterality Date  . INSERTION OF VENA CAVA FILTER    . PERIPHERAL VASCULAR THROMBECTOMY Right 03/18/2018   Procedure: PERIPHERAL VASCULAR THROMBECTOMY;  Surgeon: Renford Dills, MD;  Location: [facility]IVE [facility];  Service: Cardiovascular;  Laterality: Right;    Family History  Problem Relation Age of Onset  . Diabetes Neg Hx   . Hypertension Neg Hx     No Known Allergies     Assessment & Plan:   1. Lymphedema Recommend:  No surgery or intervention at this point in time.    I have reviewed my previous discussion with the patient regarding swelling and why it causes symptoms.  Patient will continue wearing graduated compression stockings class 1 (20-30 mmHg) on a daily basis. The patient will  beginning wearing the stockings first thing in the morning and removing them in the evening. The patient is instructed specifically not to sleep in the stockings.    In addition, behavioral modification including several periods of elevation of the lower extremities during the day will be continued.  This was reviewed with the patient during the initial visit.  The patient will also continue routine exercise, especially walking on a daily basis as was discussed during the initial visit.    Despite conservative treatments of over a year, including graduated compression therapy class 1 and behavioral modification including exercise and elevation the patient  has not obtained adequate control of the lymphedema.  The patient still has stage 3 lymphedema and therefore, I believe that a lymph pump should be added to improve the control of the patient's lymphedema.  Additionally, a lymph pump is warranted because it will reduce the risk of cellulitis and ulceration in the future.  Patient should follow-up in six months    2. Smoker Smoking cessation was  discussed, 3-10 minutes spent on this topic specifically   3. 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 The patient continues with conservative therapy as outlined above.  Patient does have some postoperative pain however his hip gives him more issues than anything.   Current Outpatient Medications on File Prior to Visit  Medication Sig Dispense Refill  . furosemide (LASIX) 40 MG tablet Take 1 tablet (40 mg total) by mouth 2 (two) times daily. 180 tablet 3  .  metoprolol succinate (TOPROL-XL) 25 MG 24 hr tablet Take 3 tablets (75 mg total) by mouth daily. 180 tablet 3  . spironolactone (ALDACTONE) 25 MG tablet TAKE 1/2 TABLET BY MOUTH EVERY DAY 45 tablet 0  . XARELTO 20 MG TABS tablet TAKE 1 TABLET(20 MG) BY MOUTH DAILY WITH SUPPER 90 tablet 1   No current facility-administered medications on file prior to visit.    There are no Patient Instructions on file for this visit. No follow-ups on file.   Georgiana Spinner, NP

## 2020-06-30 ENCOUNTER — Other Ambulatory Visit: Payer: Self-pay | Admitting: Cardiovascular Disease

## 2020-07-11 ENCOUNTER — Other Ambulatory Visit: Payer: Medicaid Other

## 2020-07-22 IMAGING — DX PORTABLE CHEST - 1 VIEW
1 series · 1 of 1 positions shown · non-contrast
Comparison: CT scan and radiographs January 13, 2019.

CLINICAL DATA: Nonproductive cough, shortness of breath.

EXAM:
PORTABLE CHEST 1 VIEW

[chest ap]
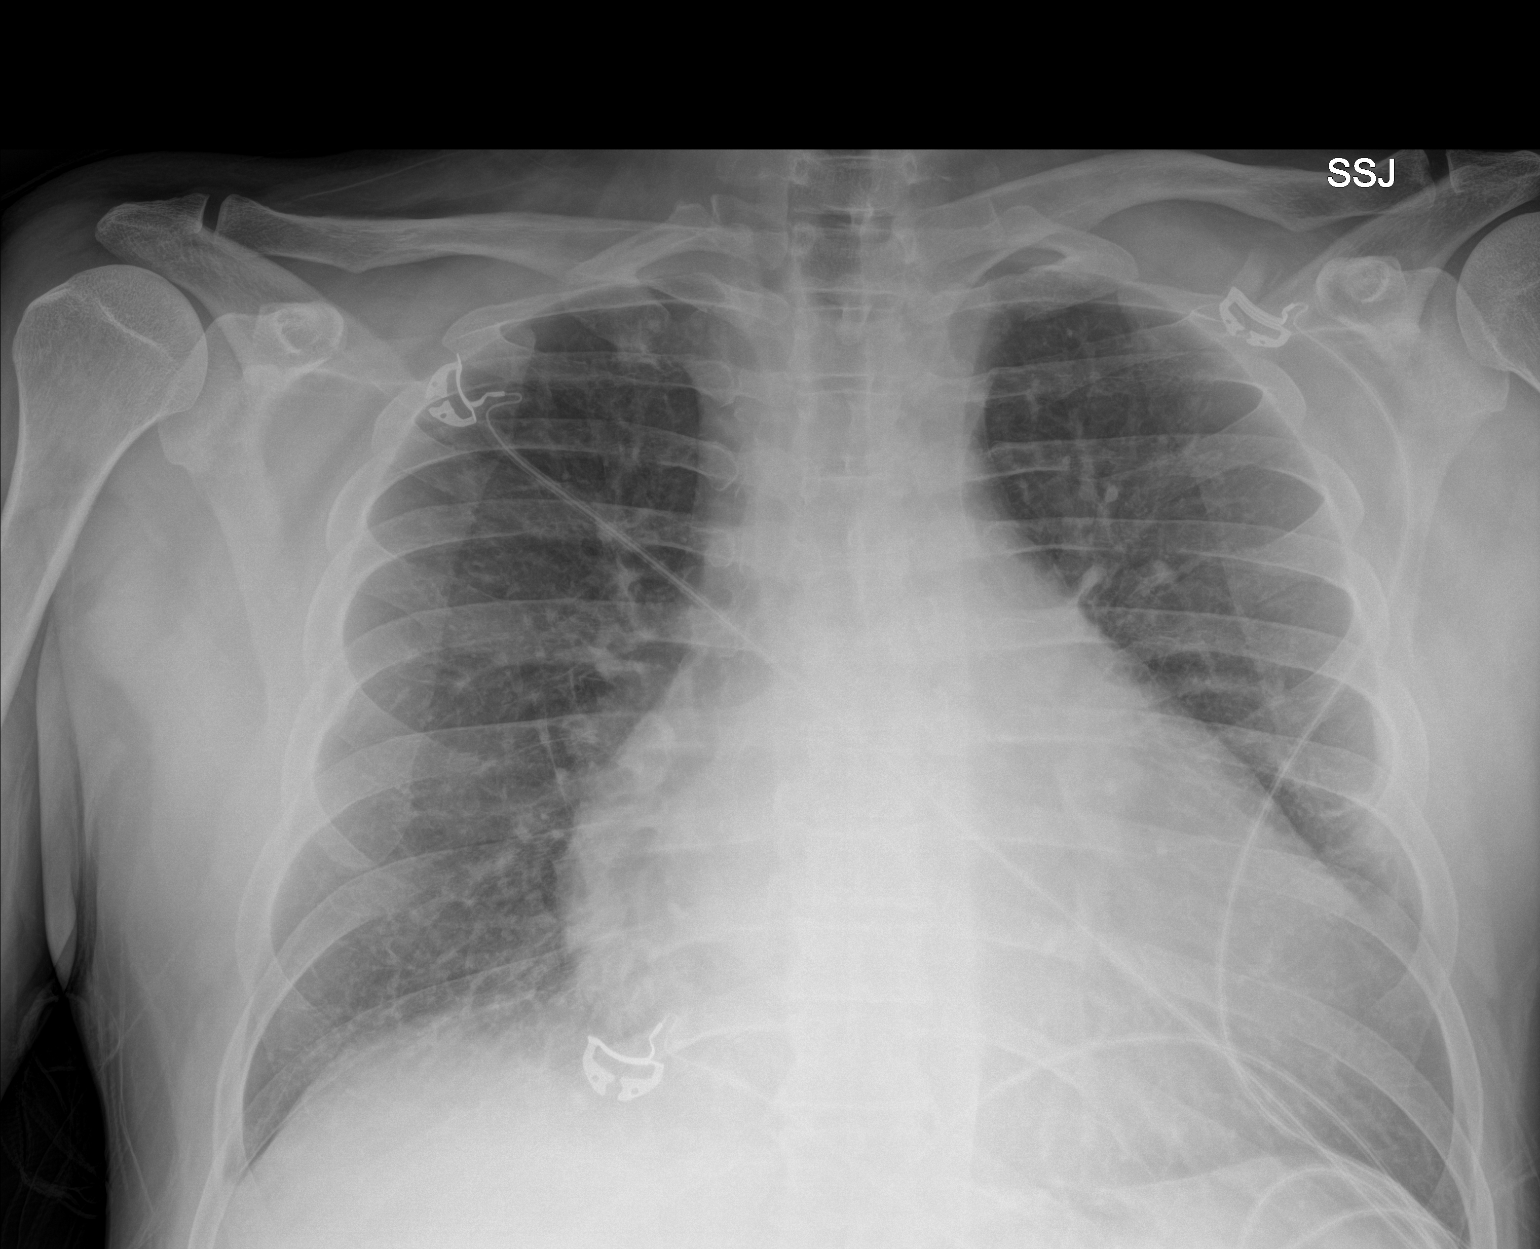

[1 of 1 positions shown; findings below may reference images not displayed]

FINDINGS: Stable cardiomegaly. No pneumothorax or pleural effusion is noted.
Both lungs are clear. The visualized skeletal structures are
unremarkable.
IMPRESSION: No active disease.

## 2020-07-31 ENCOUNTER — Other Ambulatory Visit: Payer: Self-pay

## 2020-07-31 ENCOUNTER — Telehealth: Payer: Self-pay | Admitting: Cardiovascular Disease

## 2020-07-31 MED ORDER — METOPROLOL SUCCINATE ER 25 MG PO TB24: 75.0000 mg | ORAL_TABLET | Freq: Every day | ORAL | 3 refills | Status: DC

## 2020-07-31 NOTE — Telephone Encounter (Signed)
*  STAT* If patient is at the pharmacy, call can be transferred to refill team.   1. Which medications need to be refilled? (please list name of each medication and dose if known) metoprolol 25 mg 3xday  2. Which pharmacy/location (including street and city if local pharmacy) is medication to be sent to? [facility] in [place]  3. Do they need a 30 day or 90 day supply? 90

## 2020-07-31 NOTE — Progress Notes (Deleted)
Cardiology Office Note    Date:  07/31/2020   ID:  Henry Dunn, DOB 1976/06/02, MRN [id number]  PCP:  [facility]  Cardiologist:  Julien Nordmann, MD  Electrophysiologist:  None   Chief Complaint: ***  History of Present Illness:   Henry Dunn is a 44 y.o. 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   He has a long history of tachypalpitations.  He was admitted to the hospital in 01/2017 with new onset atrial flutter with RVR.  Echo showed an EF of 55 to 60%, normal wall motion, mildly dilated left atrium, no significant valvular disease.  He converted to sinus rhythm during the admission.  His arrhythmia was felt to possibly be in the setting of groin abscess.  He was also noted to have left lower extremity DVT.  He was seen in the ED several times in 12/2018 with A. fib with RVR with outpatient follow-up after rate control.  He was admitted to the hospital in 01/2019 with acute systolic CHF with echo showing an EF of 20 to 25%, moderately to severely dilated LV cavity, diffuse hypokinesis, moderately dilated left atrium, mildly dilated right atrium, and moderate mitral regurgitation.  He was in A. fib with RVR.  He was consulted on by outside cardiology group and IV diuresed.  Troponin negative x2.  CTA chest showed no obvious PE.  Rate control therapy was escalated and he was advised to follow-up with cardiology as outpatient.  He was seen virtually by primary cardiologist in 03/2019 and was started on amiodarone load in preparation  for restoration of sinus rhythm.  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  He was seen in person 3 months later, in 07/2019, and was still in A. fib with controlled ventricular response.  BP was soft at 90/62.  He was noted to be without insurance and there were concerns regarding price of potential cardioversion and continued medical therapy was recommended.  He was seen in 10/2019 with volume overload with recommended escalation of diuretic therapy and preferred to defer cardioversion.  He was seen in 01/2020, with continued SOB and fatigue.  He continued to smoke and drink 6-8, sometimes 12 beers.  He was last seen in the office in 03/2020, with stable weight.  He indicated he had Medicare/Medicad and wanted to have his hip repaired.  He remained in Afib, which had been present for ~ 2 years at that time.  Echo was recommended and showed ***.  He has been seen by orthopedics with consideration for total hip arthroplasty.   ***   Labs independently reviewed: 01/2020 - BUN 11, SCr 1.15, potassium 4.2 10/2019 - HGB 16.4, PLT 274 01/2019 - magnesium 1.9, albumin 4.0, AST/ALT normal  Past Medical History:  Diagnosis Date  . A-fib (HCC)   . CHF (congestive heart failure) (HCC)   . DVT (deep venous thrombosis) (HCC)    a. In setting of dislocated hip, status post IVC filter, previously on Coumadin, noncompliant over the past 12 months  . Hx of blood clots    a. Patient  denies any family history of clotting disorder, patient denies prior hypercoagulable workup  . PE (pulmonary thromboembolism) (HCC)     Past Surgical History:  Procedure Laterality Date  . INSERTION OF VENA CAVA FILTER    . PERIPHERAL VASCULAR THROMBECTOMY Right 03/18/2018   Procedure: PERIPHERAL VASCULAR THROMBECTOMY;  Surgeon: Renford Dills, MD;  Location: [facility] INVASIVE CV [facility];  Service: Cardiovascular;  Laterality: Right;    Current Medications: No outpatient medications have been marked as taking for the  08/06/20 encounter (Appointment) with [name], PA-C.    Allergies:   Patient has no known allergies.   Social History   Socioeconomic History  . Marital status: Single    Spouse name: Not on file  . Number of children: Not on file  . Years of education: Not on file  . Highest education level: Not on file  Occupational History  . Not on file  Tobacco Use  . Smoking status: Current Every Day Smoker    Packs/day: 0.25    Types: Cigarettes  . Smokeless tobacco: Never Used  Vaping Use  . Vaping Use: Never used  Substance and Sexual Activity  . Alcohol use: Yes    Alcohol/week: 12.0 standard drinks    Types: 12 Cans of beer per week  . Drug use: Not Currently  . Sexual activity: Not on file  Other Topics Concern  . Not on file  Social History Narrative  . Not on file   Social Determinants of Health   Financial Resource Strain:   . Difficulty of Paying Living Expenses: Not on file  Food Insecurity:   . Worried About Programme researcher, broadcasting/film/video in the Last Year: Not on file  . Ran Out of Food in the Last Year: Not on file  Transportation Needs:   . Lack of Transportation (Medical): Not on file  . Lack of Transportation (Non-Medical): Not on file  Physical Activity:   . Days of Exercise per Week: Not on file  . Minutes of Exercise per Session: Not on file  Stress:   . Feeling of Stress : Not on file  Social Connections:   . Frequency of Communication with Friends and Family: Not on file  . Frequency of Social Gatherings with Friends and Family: Not on file  . Attends Religious Services: Not on file  . Active Member of Clubs or Organizations: Not on file  . Attends Banker Meetings: Not on file  . Marital Status: Not on file     Family History:  The patient's family history is negative for Diabetes and Hypertension.  ROS:   ROS   EKGs/Labs/Other Studies Reviewed:    Studies reviewed were summarized above. The additional studies were reviewed  today:  2D echo 08/06/2020: *** __________  2D echo 02/22/2019: 1. The left ventricle has severely reduced systolic function, with an  ejection fraction of 20-25%. The cavity size was moderate to severely  dilated. Left ventricular diastolic function could not be evaluated.  Elevated left ventricular end-diastolic  pressure Left ventricular diffuse hypokinesis.  2. Left atrial size was moderately dilated.  3. Right atrial size was mildly dilated.  4. Mitral valve regurgitation is moderate by color flow Doppler. __________  2D echo 02/18/2017: - Left ventricle: The cavity size was mildly dilated. Wall  thickness was normal. Systolic function was normal. The estimated  ejection fraction was in the range of 55% to 60%. Wall motion was  normal; there were no regional wall motion abnormalities. Left  ventricular diastolic function parameters were normal.  - Left atrium: The atrium was mildly dilated.    EKG:  EKG is ordered today.  The EKG ordered today demonstrates ***  Recent Labs: 11/10/2019: Hemoglobin 16.4; Platelets 274 01/11/2020: BUN 11; Creatinine, Ser 1.15; Potassium 4.2; Sodium 139  Recent Lipid Panel No results found for: CHOL, TRIG, HDL, CHOLHDL, VLDL, LDLCALC, LDLDIRECT  PHYSICAL EXAM:    VS:  There were no vitals taken for this visit.  BMI: There is no height or weight on file to calculate BMI.  Physical Exam  Wt Readings from Last 3 Encounters:  06/17/20 208 lb (94.3 kg)  04/09/20 211 lb 2 oz (95.8 kg)  01/11/20 213 lb 12 oz (97 kg)     ASSESSMENT & PLAN:   1. HFrEF: Felt to be NICM in etiology and tachy-mediated.  ***  2. Atrial flutter/fib: ***  3. Mitral regurgitation:  4. History of DVT/PE:  5. Tobacco use:  Disposition: F/u with Dr. Mariah Milling or an APP in ***.   Medication Adjustments/Labs and Tests Ordered: Current medicines are reviewed at length with the patient today.  Concerns regarding medicines are outlined above. Medication  changes, Labs and Tests ordered today are summarized above and listed in the Patient Instructions accessible in Encounters.   Signed, Eula Listen, PA-C 07/31/2020 4:13 PM     [facility] [address] [place], [place] [address] [phone]

## 2020-07-31 NOTE — Telephone Encounter (Signed)
metoprolol succinate (TOPROL-XL) 25 MG 24 hr tablet 180 tablet 3 07/31/2020    Sig - Route: Take 3 tablets (75 mg total) by mouth daily. - Oral   Sent to pharmacy as: metoprolol succinate (TOPROL-XL) 25 MG 24 hr tablet   E-Prescribing Status: Sent to pharmacy (07/31/2020  2:35 PM EDT)   Pharmacy  [id number] - [place], [place] - [address] AT [facility] [address] [facility]

## 2020-08-06 ENCOUNTER — Other Ambulatory Visit: Payer: Medicaid Other

## 2020-08-06 ENCOUNTER — Ambulatory Visit: Payer: Medicare Other | Admitting: Physician Assistant

## 2020-08-06 NOTE — Progress Notes (Signed)
Cardiology Office Note    Date:  08/08/2020   ID:  Henry Dunn, DOB [birth year], MRN [id number]  PCP:  [facility]  Cardiologist:  Julien Nordmann, MD  Electrophysiologist:  None   Chief Complaint: Preoperative cardiac risk stratification   History of Present Illness:   Henry Dunn is a 44 y.o. 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   He has a long history of tachypalpitations.  He was admitted to the hospital in 01/2017 with new onset atrial flutter with RVR.  Echo showed an EF of 55 to 60%, normal wall motion, mildly dilated left atrium, no significant valvular disease.  He converted to sinus rhythm during the admission.  His arrhythmia was felt to possibly be in the setting of groin abscess.  He was also noted to have left lower extremity DVT.  He was seen in the ED several times in 12/2018 with A. fib with RVR with outpatient follow-up after rate control.  He was admitted to the hospital in 01/2019 with acute systolic CHF with echo showing an EF of 20 to 25%, moderately to severely dilated LV cavity, diffuse hypokinesis, moderately dilated left atrium, mildly dilated right atrium, and moderate mitral regurgitation.  He was in A. fib with RVR.  He was consulted on by outside cardiology group and IV diuresed.  Troponin negative x2.  CTA chest showed no obvious PE.  Rate control therapy was escalated and he was advised to follow-up with cardiology as outpatient.  He was seen virtually by  primary cardiologist in 03/2019 and was started on amiodarone load in preparation for restoration of sinus rhythm.  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  He was seen in person 3 months later, in 07/2019, and was still in A. fib with controlled ventricular response.  BP was soft at 90/62.  He was noted to be without insurance and there were concerns regarding price of potential cardioversion and continued medical therapy was recommended.  He was seen in 10/2019 with volume overload with recommended escalation of diuretic therapy and preferred to defer cardioversion.  He was seen in 01/2020, with continued SOB and fatigue.  He continued to smoke and drink 6-8, sometimes 12 beers.  He was last seen in the office in 03/2020, with stable weight.  He indicated he had Medicare/Medicad and wanted to have his hip repaired.  He remained in Afib, which had been present for ~ 2 years at that time.  Echo was recommended and is scheduled for 08/09/2020.  He has been seen by orthopedics with consideration for total hip arthroplasty.   He comes in doing well from a cardiac perspective.  No chest pain, dyspnea, palpitations, dizziness, presyncope, syncope, lower extremity swelling, abdominal distention, orthopnea, PND, early satiety.  No falls, hematochezia, melena, hemoptysis, hematemesis, or hematuria.  He is tolerating Xarelto without issues and has not missed any doses.  His weight remains stable.  Revised Cardiac Index: low risk for noncardiac surgery Duke Activity Status Index: > 4 METs without cardiac limitation (is a limiting factor at this  time is left hip pain)   Labs independently reviewed: 01/2020 - BUN 11, SCr 1.15, potassium 4.2 10/2019 - HGB 16.4, PLT 274 01/2019 - magnesium 1.9, albumin 4.0, AST/ALT normal   Past Medical History:  Diagnosis Date  . A-fib (HCC)   . CHF (congestive heart failure) (HCC)   . DVT (deep venous thrombosis) (HCC)    a. In setting of dislocated hip, status post  IVC filter, previously on Coumadin, noncompliant over the past 12 months  . Hx of blood clots    a. Patient denies any family history of clotting disorder, patient denies prior hypercoagulable workup  . PE (pulmonary thromboembolism) (HCC)     Past Surgical History:  Procedure Laterality Date  . INSERTION OF VENA CAVA FILTER    . PERIPHERAL VASCULAR THROMBECTOMY Right 03/18/2018   Procedure: PERIPHERAL VASCULAR THROMBECTOMY;  Surgeon: Renford Dills, MD;  Location: [facility] INVASIVE CV LAB;  Service: Cardiovascular;  Laterality: Right;    Current Medications: Current Meds  Medication Sig  . furosemide (LASIX) 40 MG tablet Take 1 tablet (40 mg total) by mouth 2 (two) times daily.  . metoprolol succinate (TOPROL-XL) 100 MG 24 hr tablet Take 1 tablet (100 mg total) by mouth daily.  Marland Kitchen spironolactone (ALDACTONE) 25 MG tablet TAKE 1/2 TABLET BY MOUTH EVERY DAY  . XARELTO 20 MG TABS tablet TAKE 1 TABLET(20 MG) BY MOUTH DAILY WITH SUPPER  . [DISCONTINUED] metoprolol succinate (TOPROL-XL) 25 MG 24 hr tablet Take 3 tablets (75 mg total) by mouth daily.    Allergies:   Patient has no known allergies.   Social History   Socioeconomic History  . Marital status: Single    Spouse name: Not on file  . Number of children: Not on file  . Years of education: Not on file  . Highest education level: Not on file  Occupational History  . Not on file  Tobacco Use  . Smoking status: Current Every Day Smoker    Packs/day: 0.25    Types: Cigarettes  . Smokeless tobacco: Never Used  Vaping Use  . Vaping Use: Never used  Substance and Sexual Activity  . Alcohol use: Yes    Alcohol/week: 12.0 standard drinks    Types: 12 Cans of beer per week  . Drug use: Not Currently  . Sexual activity: Not on file  Other Topics Concern  . Not on file  Social History Narrative  . Not on file   Social Determinants of Health   Financial Resource Strain:   . Difficulty of Paying Living Expenses: Not on file    Food Insecurity:   . Worried About Programme researcher, broadcasting/film/video in the Last Year: Not on file  . Ran Out of Food in the Last Year: Not on file  Transportation Needs:   . Lack of Transportation (Medical): Not on file  . Lack of Transportation (Non-Medical): Not on file  Physical Activity:   . Days of Exercise per Week: Not on file  . Minutes of Exercise per Session: Not on file  Stress:   . Feeling of Stress : Not on file  Social Connections:   . Frequency of Communication with Friends and Family: Not on file  . Frequency of Social Gatherings with Friends and Family: Not on file  . Attends Religious Services: Not on file  . Active Member of Clubs or Organizations: Not on file  . Attends Banker Meetings: Not on file  . Marital Status: Not on file  Family History:  The patient's family history is negative for Diabetes and Hypertension.  ROS:   Review of Systems  Constitutional: Negative for chills, diaphoresis, fever, malaise/fatigue and weight loss.  HENT: Negative for congestion.   Eyes: Negative for discharge and redness.  Respiratory: Negative for cough, hemoptysis, sputum production, shortness of breath and wheezing.   Cardiovascular: Positive for palpitations. Negative for chest pain, orthopnea, claudication, leg swelling and PND.  Gastrointestinal: Negative for abdominal pain, blood in stool, heartburn, melena, nausea and vomiting.  Genitourinary: Negative for hematuria.  Musculoskeletal: Positive for joint pain. Negative for falls and myalgias.       Left hip pain  Skin: Negative for rash.  Neurological: Negative for dizziness, tingling, tremors, sensory change, speech change, focal weakness, loss of consciousness and weakness.  Endo/Heme/Allergies: Does not bruise/bleed easily.  Psychiatric/Behavioral: Negative for substance abuse. The patient is not nervous/anxious.   All other systems reviewed and are negative.    EKGs/Labs/Other Studies Reviewed:     Studies reviewed were summarized above. The additional studies were reviewed today:  2D echo 08/09/2020: Pending __________  2D echo 02/22/2019: 1. The left ventricle has severely reduced systolic function, with an  ejection fraction of 20-25%. The cavity size was moderate to severely  dilated. Left ventricular diastolic function could not be evaluated.  Elevated left ventricular end-diastolic  pressure Left ventricular diffuse hypokinesis.  2. Left atrial size was moderately dilated.  3. Right atrial size was mildly dilated.  4. Mitral valve regurgitation is moderate by color flow Doppler. __________  2D echo 02/18/2017: - Left ventricle: The cavity size was mildly dilated. Wall  thickness was normal. Systolic function was normal. The estimated  ejection fraction was in the range of 55% to 60%. Wall motion was  normal; there were no regional wall motion abnormalities. Left  ventricular diastolic function parameters were normal.  - Left atrium: The atrium was mildly dilated.    EKG:  EKG is ordered today.  The EKG ordered today demonstrates A. fib, 97 bpm, no acute ST-T changes  Recent Labs: 11/10/2019: Hemoglobin 16.4; Platelets 274 01/11/2020: BUN 11; Creatinine, Ser 1.15; Potassium 4.2; Sodium 139  Recent Lipid Panel No results found for: CHOL, TRIG, HDL, CHOLHDL, VLDL, LDLCALC, LDLDIRECT  PHYSICAL EXAM:    VS:  BP 128/88 (BP Location: Left Arm, Patient Position: Sitting, Cuff Size: Normal)   Pulse 97   Ht 5\' 8"  (1.727 m)   Wt 210 lb 9.6 oz (95.5 kg)   SpO2 98%   BMI 32.02 kg/m   BMI: Body mass index is 32.02 kg/m.  Physical Exam Constitutional:      Appearance: He is well-developed.  HENT:     Head: Normocephalic and atraumatic.  Eyes:     General:        Right eye: No discharge.        Left eye: No discharge.  Neck:     Vascular: No JVD.  Cardiovascular:     Rate and Rhythm: Normal rate. Rhythm irregularly irregular.     Pulses: No midsystolic  click and no opening snap.          Posterior tibial pulses are 2+ on the right side and 2+ on the left side.     Heart sounds: S1 normal and S2 normal. Heart sounds not distant. Murmur heard. High-pitched blowing holosystolic murmur is present with a grade of 2/6 at the apex.  No friction rub.  Pulmonary:     Effort: Pulmonary effort is normal.  No respiratory distress.     Breath sounds: Normal breath sounds. No decreased breath sounds, wheezing or rales.  Chest:     Chest wall: No tenderness.  Abdominal:     General: There is no distension.     Palpations: Abdomen is soft.     Tenderness: There is no abdominal tenderness.  Musculoskeletal:     Cervical back: Normal range of motion.  Skin:    General: Skin is warm and dry.     Nails: There is no clubbing.  Neurological:     Mental Status: He is alert and oriented to person, place, and time.  Psychiatric:        Speech: Speech normal.        Behavior: Behavior normal.        Thought Content: Thought content normal.        Judgment: Judgment normal.     Wt Readings from Last 3 Encounters:  08/08/20 210 lb 9.6 oz (95.5 kg)  06/17/20 208 lb (94.3 kg)  04/09/20 211 lb 2 oz (95.8 kg)     ASSESSMENT & PLAN:   1. Preoperative cardiac risk stratification: He is doing well from a cardiac perspective without symptoms concerning for angina or decompensated heart failure.  Revised Cardiac Index - low risk for noncardiac surgery.  Duke Activity Status Index - >4 METs without cardiac limitation.  His limiting factor at this time is left hip pain.  Await echo, which is scheduled for 08/09/2020 with further recommendations regarding risk stratification pending these results.  Should his echo demonstrate stable to improved LVSF, he should be able to proceed with noncardiac surgery without further evaluation.  If his cardiomyopathy has worsened he will need to have this reevaluated prior to proceeding with elective surgery.  If he is able to  proceed with elective orthopedic surgery he would need to hold his Xarelto for 2 days prior.  2. HFrEF: Felt to be NICM in etiology and tachy-mediated.  He is euvolemic and well compensated.  Titrate Toprol-XL to 100 mg daily.  Otherwise, he will continue current dose of spironolactone and Lasix with recent renal function and potassium being stable as outlined above.  In follow-up, consider adding losartan.  This was deferred today with preference to titrate Toprol for added ventricular rate control with his longstanding persistent A. fib.  Echo pending to reevaluate cardiomyopathy as outlined above.  CHF education.  3. Longstanding persistent atrial flutter/fib: He has not been in A. fib/flutter persistently for approximately 2 years and the likelihood of restoring normal rhythm would be quite low, particularly without antiarrhythmic therapy.  That said, he is hoping to have his left hip repaired as this is causing considerable pain and affecting his ADLs.  Given the significant discomfort associated with his hip I do not feel now is the appropriate time to pursue a rhythm control strategy as there is considerable risk for recurrent A. fib in the perioperative timeframe showed a rhythm control strategy even be successful to begin with.  Given a CHA2DS2-VASc of 2 he remains on indefinite Xarelto without any symptoms concerning for bleeding.  Recent CBC demonstrated stable hemoglobin as outlined above.  4. Mitral regurgitation: Echo pending as above.   5. History of DVT/PE:  Prior IVC filter.  He remains on Xarelto.  Schedule lower extremity venous ultrasound to evaluate for thrombus burden to help assess his clinical course in the preoperative timeframe.  Would recommend he be referred to hematology for hyper coag work-up.  6.  Tobacco use: Complete cessation is recommended.   Disposition: F/u with Dr. Mariah Milling or an APP in 2 months.   Medication Adjustments/Labs and Tests Ordered: Current medicines are  reviewed at length with the patient today.  Concerns regarding medicines are outlined above. Medication changes, Labs and Tests ordered today are summarized above and listed in the Patient Instructions accessible in Encounters.   Signed, Eula Listen, PA-C 08/08/2020 11:14 AM     [facility] [address] [place], [place] [address] [phone]

## 2020-08-08 ENCOUNTER — Other Ambulatory Visit: Payer: Self-pay

## 2020-08-08 ENCOUNTER — Encounter: Payer: Self-pay | Admitting: Physician Assistant

## 2020-08-08 ENCOUNTER — Ambulatory Visit (INDEPENDENT_AMBULATORY_CARE_PROVIDER_SITE_OTHER): Payer: Medicare Other | Admitting: Physician Assistant

## 2020-08-08 VITALS — BP 128/88 | HR 97 | Ht 68.0 in | Wt 210.6 lb

## 2020-08-08 DIAGNOSIS — I4811 Longstanding persistent atrial fibrillation: Secondary | ICD-10-CM | POA: Diagnosis not present

## 2020-08-08 DIAGNOSIS — I428 Other cardiomyopathies: Secondary | ICD-10-CM

## 2020-08-08 DIAGNOSIS — M25559 Pain in unspecified hip: Secondary | ICD-10-CM

## 2020-08-08 DIAGNOSIS — I5022 Chronic systolic (congestive) heart failure: Secondary | ICD-10-CM

## 2020-08-08 DIAGNOSIS — Z01810 Encounter for preprocedural cardiovascular examination: Secondary | ICD-10-CM

## 2020-08-08 DIAGNOSIS — Z86718 Personal history of other venous thrombosis and embolism: Secondary | ICD-10-CM

## 2020-08-08 DIAGNOSIS — F17200 Nicotine dependence, unspecified, uncomplicated: Secondary | ICD-10-CM

## 2020-08-08 DIAGNOSIS — Z86711 Personal history of pulmonary embolism: Secondary | ICD-10-CM

## 2020-08-08 MED ORDER — METOPROLOL SUCCINATE ER 100 MG PO TB24: 100.0000 mg | ORAL_TABLET | Freq: Every day | ORAL | 3 refills | Status: DC

## 2020-08-08 NOTE — Patient Instructions (Signed)
Medication Instructions:  1- INCREASE Toprol Take 1 tablet (100 mg total) by mouth daily *If you need a refill on your cardiac medications before your next appointment, please call your pharmacy*   Lab Work: none ordered If you have labs (blood work) drawn today and your tests are completely normal, you will receive your results only by: Marland Kitchen MyChart Message (if you have MyChart) OR . A paper copy in the mail If you have any lab test that is abnormal or we need to change your treatment, we will call you to review the results.   Testing/Procedures: 1- LE Venous for surgery clearance Your physician has requested that you have a lower extremity venous duplex. This test is an ultrasound of the veins in the legs or arms. It looks at venous blood flow that carries blood from the heart to the legs. Allow one hour for a Lower Venous exam. There are no restrictions or special instructions.   Follow-Up: At [facility], you and your health needs are our priority.  As part of our continuing mission to provide you with exceptional heart care, we have created designated Provider Care Teams.  These Care Teams include your primary Cardiologist (physician) and Advanced Practice Providers (APPs -  Physician Assistants and Nurse Practitioners) who all work together to provide you with the care you need, when you need it.  We recommend signing up for the patient portal called "MyChart".  Sign up information is provided on this After Visit Summary.  MyChart is used to connect with patients for Virtual Visits (Telemedicine).  Patients are able to view lab/test results, encounter notes, upcoming appointments, etc.  Non-urgent messages can be sent to your provider as well.   To learn more about what you can do with MyChart, go to [email or web address].    Your next appointment:   2 month(s)  The format for your next appointment:   In Person  Provider:     You may see Julien Nordmann, MD or Eula Listen,  PA-C

## 2020-08-09 ENCOUNTER — Ambulatory Visit (INDEPENDENT_AMBULATORY_CARE_PROVIDER_SITE_OTHER): Payer: Medicare Other

## 2020-08-09 DIAGNOSIS — Z86718 Personal history of other venous thrombosis and embolism: Secondary | ICD-10-CM

## 2020-08-09 DIAGNOSIS — I4819 Other persistent atrial fibrillation: Secondary | ICD-10-CM | POA: Diagnosis not present

## 2020-08-09 DIAGNOSIS — I420 Dilated cardiomyopathy: Secondary | ICD-10-CM | POA: Diagnosis not present

## 2020-08-10 LAB — ECHOCARDIOGRAM COMPLETE
AR max vel: 4.53 cm2
AV Area VTI: 5.16 cm2
AV Area mean vel: 4.36 cm2
AV Mean grad: 1 mmHg
AV Peak grad: 2.2 mmHg
Ao pk vel: 0.75 m/s
S' Lateral: 4.3 cm

## 2020-08-12 ENCOUNTER — Telehealth: Payer: Self-pay

## 2020-08-12 NOTE — Telephone Encounter (Signed)
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 Await echo read and result note from primary cardiologist for formal and final risk stratification. From an Afib perspective, Xarelto could be held for 2 days prior to surgery. With regards to his chronic DVTs, if orthopedics wanted to bridge with Lovenox they could, this is deferred to them. Recommend resuming Xarelto as soon as safely possible under the direction of orthopedic surgery. Discussed with Dr. Mariah Milling.

## 2020-08-12 NOTE — Telephone Encounter (Signed)
Henry Iba, MD  P Cv Div [place] Triage Echocardiogram  Some improvement in his ejection fraction previously 25% now up to 35 to 40%  Continue current medications  Avoid excess alcohol which can weaken the heart    Call to patient to review ultrasound results.    Pt verbalized understanding and has no further questions at this time.    Advised pt to call for any further questions or concerns.  No further orders.

## 2020-08-14 ENCOUNTER — Telehealth: Payer: Self-pay | Admitting: Cardiovascular Disease

## 2020-08-14 NOTE — Telephone Encounter (Signed)
Clinical pharmacist to review holding time for Xarelto to make sure 2 days hold is appropriate in this case.

## 2020-08-14 NOTE — Telephone Encounter (Signed)
   [facility] Pre-operative Risk Assessment    HEARTCARE STAFF: - Please ensure there is not already an duplicate clearance open for this procedure. - Under Visit Info/Reason for Call, type in Other and utilize the format Clearance MM/DD/YY or Clearance TBD. Do not use dashes or single digits. - If request is for dental extraction, please clarify the # of teeth to be extracted.  Request for surgical clearance:  1. What type of surgery is being performed? L Total Hip Arthoplasty  2. When is this surgery scheduled? TBD  3. What type of clearance is required (medical clearance vs. Pharmacy clearance to hold med vs. Both)? Not noted  4. Are there any medications that need to be held prior to surgery and how long? Not noted  5. Practice name and name of physician performing surgery? [facility] Dr. Rudene Christians  6. What is the office phone number? [phone]   7.   What is the office fax number? [phone]  8.   Anesthesia type (None, local, MAC, general) ? Not noted   Marykay Lex 08/14/2020, 2:02 PM  _________________________________________________________________   (provider comments below)

## 2020-08-14 NOTE — Telephone Encounter (Signed)
   Primary Cardiologist: Julien Nordmann, MD  Chart reviewed as part of pre-operative protocol coverage. Given past medical history and time since last visit, based on ACC/AHA guidelines, Huston Stonehocker would be at acceptable risk for the planned procedure without further cardiovascular testing. Patient was seen recently by Eula Listen PA-C on 08/08/2020, per Ryan's note, " Duke Activity Status Index - >4 METs without cardiac limitation.  His limiting factor at this time is left hip pain.  Await echo, which is scheduled for 08/09/2020 with further recommendations regarding risk stratification pending these results.  Should his echo demonstrate stable to improved LVSF, he should be able to proceed with noncardiac surgery without further evaluation.  If he is able to proceed with elective orthopedic surgery he would need to hold his Xarelto for 2 days prior." Repeat echocardiogram obtained on 08/09/2020 showed improved EF from the previous 20-25% to 35-40%.   The patient was advised that if he develops new symptoms prior to surgery to contact our office to arrange for a follow-up visit, and he verbalized understanding.  I will route this recommendation to the requesting party via Epic fax function and remove from pre-op pool.  Please call with questions.  [place], [place] 08/14/2020, 5:02 PM

## 2020-08-15 NOTE — Telephone Encounter (Signed)
Patient with diagnosis of afib/aflutter on Xarelto for anticoagulation. Also has history of recurrent VTE - prior DVT/PE in the setting of dislocated hip in 2006 secondary to mechanical fall, DVT in 2019 s/p thrombectomy in the setting of noncompliance with warfarin (then transitioned to Xarelto), PE also noted on PMH.  Procedure: left THA Date of procedure: TBD  CHADS2-VASc score of 3 (CHF, VTE)  CrCl >152mL/min Platelet count 274K  Typically hold Xarelto for 3 days prior to THA although request is for 2 day hold. In setting of recurrent VTE, will forward to MD regarding length of anticoag hold.

## 2020-08-18 NOTE — Telephone Encounter (Signed)
He can hold Xarelto for 3 days as suggested by pharmacy unless surgeon only requesting 2 days Acceptable risk for procedure no further testing needed

## 2020-08-19 ENCOUNTER — Telehealth: Payer: Self-pay | Admitting: *Deleted

## 2020-08-19 NOTE — Telephone Encounter (Signed)
Duplicate note, clearance already open in a separate encounter, operator reported in Epic chat she reached out to callback to get more information. Will remove this duplicate from the box.

## 2020-08-19 NOTE — Telephone Encounter (Signed)
In separate duplicate phone note in the preop box Dalia Heading wrote "Alyson from Dr Rosita Kea office is returning Mindy's call from this morning." Will route to callback to help.

## 2020-08-19 NOTE — Telephone Encounter (Signed)
Can someone confirm with Dr. Rosita Kea what type on anesthesia will be used? Will need to know if spinal anesthesia will be used. If it is, that will determine how long pt needs to hold.

## 2020-08-19 NOTE — Telephone Encounter (Signed)
It will be up to patient as far as what type of anesthesia he wants. If he wants spinal he will need a 3 day hold. Dr. Mariah Milling has cleared him to do so

## 2020-08-19 NOTE — Telephone Encounter (Signed)
   Primary Cardiologist: Julien Nordmann, MD  Chart reviewed as part of pre-operative protocol coverage. Pre-op clearance has been finalized in notes as outlined below.  -Azalee Course PA-C reviewed chart on 08/14/20 and states, "Given past medical history and time since last visit, based on ACC/AHA guidelines, Henry Dunn would be at acceptable risk for the planned procedure without further cardiovascular testing." The patient was recently seen by Eula Listen PA-C on 08/08/20 at which time he was able to complete >4 METS without cardiac limitation and echocardiogram had shown improved LVEF from prior (20-25% to 35-40%). Therefore patient is cleared to proceed without further testing.  Regarding duration of holding anticoagulation, that depends on type of anesthesia used. The surgeon's office replied expressing preference for spinal anesthesia. Per pharmacy team, if he does spinal, the patient will need a 3 day hold and Dr. Mariah Milling has cleared him to do so.   Will route this bundled recommendation to requesting provider via Epic fax function and remove from pre-op pool. Please call with questions.  Laurann Montana, PA-C 08/19/2020, 2:33 PM

## 2020-08-19 NOTE — Telephone Encounter (Signed)
Spoke with Dr Rosita Kea office and the anesthesia type to be used is not noted in the patients chart. They will ask Dr Rosita Kea and call the office back to let us know.

## 2020-08-19 NOTE — Telephone Encounter (Signed)
Returned call to [place] and she states that Dr Rosita Kea prefers to do spinal but is willing to do general.

## 2020-08-19 NOTE — Telephone Encounter (Signed)
Folow Up:     Alyson from Dr Rosita Kea office is returning Mindy's call from this morning.

## 2020-09-11 ENCOUNTER — Telehealth: Payer: Self-pay

## 2020-09-11 NOTE — Telephone Encounter (Addendum)
Henry Dunn  (Key: [place]) [id number] rug Xarelto 20MG  tablets Member ID# [employer] ([employer]) Call-In Form Use this form to document the information needed to request drug prior authorization by phone. [phone] Original Claim Info Prior Approval for patient assistance number to contact is  [phone].

## 2020-09-11 NOTE — Telephone Encounter (Signed)
Spoke with Mr. Henry Dunn regarding the patient assistance for Xarelto. The patient stated, he now has disability with  Medicare/Medicaid and would not qualify for the [facility] patient assistance program for the Xarelto.  The patient will contact our office back if has any further questions regarding his Xarelto.

## 2020-10-13 NOTE — Progress Notes (Deleted)
Cardiology Office Note  Date:  10/13/2020   ID:  Mance Vallejo, DOB 1976/05/12, MRN [id number]  PCP:  [facility]   No chief complaint on file.   HPI:  Henry Dunn is a 44 year old gentleman with past medical history of Left hip dislocation  right lower extremity DVT in the past, had IVC filter Chronic smoker, shortness of breath developed atrial fibrillation with RVR February 23 2017 after having groin abscess debrided, started on warfarin RT leg thrombectomy    DVT April 2019, noncompliance with warfarin IVC filter in place Echo 01/2019 left ventricle has severely reduced systolic function, with an ejection fraction of 20-25%. Atrial fibrillation starting early 2019 He presents today for routine follow-up of his atrial fibrillation  Seen in clinic 01/11/2020, continued dyspnea on exertion shortness of breath Also reported having fatigue Smoking, alcohol 6-8 beers sometimes up to 12  Weight down 8-9 pounds Feels baseline weight 210, down from 219  Severe left hip pain  Reports he is taking Lasix 40 daily Sometimes extra lasix in the PM, for ABD bloating  on Xarelto  Chronic mild left leg swelling from prior DVT   Reports he has Medicare Medicaid, would like to get his hip fixed  EKG personally reviewed by myself on todays visit Atrial fib with rate 94 bpm  Other past medical hx Hospital admission March 2020 acute on chronic diastolic and systolic CHF Presented in atrial fibrillation with RVR Reported having 1 month of shortness of breath Ejection fraction 20 to 25%, was not seen by cardiology  EKG at [facility] showing atrial fibrillation in February 2020  Several trips to the emergency room in February 2020  Echocardiogram March 2020  1. The left ventricle has severely reduced systolic function, with an ejection fraction of 20-25%. The cavity size was moderate to severely dilated. Left ventricular diastolic function could not be evaluated. Elevated  left ventricular end-diastolic  pressure Left ventricular diffuse hypokinesis.  2. Left atrial size was moderately dilated.  3. Right atrial size was mildly dilated.  4. Mitral valve regurgitation is moderate by color flow Doppler.  March 2018 Was noted to be in atrial fibrillation/flutter Converted to normal sinus rhythm in the hospital In the hospital had paroxysmal A. fib/flutter, started on metoprolol CHADS2VASc at least 1 (vascular disease)  Echocardiogram 02/18/2017 - Left ventricle: The cavity size was mildly dilated. The estimated ejection fraction was in the range of 55% to 60%. Wall motion wasnormal - Left atrium: The atrium was mildly dilated.   PMH:   has a past medical history of A-fib (HCC), CHF (congestive heart failure) (HCC), DVT (deep venous thrombosis) (HCC), blood clots, and PE (pulmonary thromboembolism) (HCC).  PSH:    Past Surgical History:  Procedure Laterality Date  . INSERTION OF VENA CAVA FILTER    . PERIPHERAL VASCULAR THROMBECTOMY Right 03/18/2018   Procedure: PERIPHERAL VASCULAR THROMBECTOMY;  Surgeon: Renford Dills, MD;  Location: [facility] INVASIVE CV LAB;  Service: Cardiovascular;  Laterality: Right;    Current Outpatient Medications  Medication Sig Dispense Refill  . furosemide (LASIX) 40 MG tablet Take 1 tablet (40 mg total) by mouth 2 (two) times daily. 180 tablet 3  . metoprolol succinate (TOPROL-XL) 100 MG 24 hr tablet Take 1 tablet (100 mg total) by mouth daily. 90 tablet 3  . spironolactone (ALDACTONE) 25 MG tablet TAKE 1/2 TABLET BY MOUTH EVERY DAY 45 tablet 0  . XARELTO 20 MG TABS tablet TAKE 1 TABLET(20 MG) BY MOUTH DAILY WITH SUPPER  90 tablet 1   No current facility-administered medications for this visit.    Allergies:   Patient has no known allergies.   Social History:  The patient  reports that he has been smoking cigarettes. He has been smoking about 0.25 packs per day. He has never used smokeless tobacco. He reports current  alcohol use of about 12.0 standard drinks of alcohol per week. He reports previous drug use.   Family History:   family history is not on file.    Review of Systems: Review of Systems  Constitutional: Negative.   HENT: Negative.   Respiratory: Positive for shortness of breath.   Cardiovascular: Negative.   Gastrointestinal: Negative.        Abdominal fullness  Musculoskeletal: Negative.   Neurological: Negative.   Psychiatric/Behavioral: Negative.   All other systems reviewed and are negative.   PHYSICAL EXAM: VS:  There were no vitals taken for this visit. , BMI There is no height or weight on file to calculate BMI. Constitutional:  oriented to person, place, and time. No distress.  HENT:  Head: Grossly normal Eyes:  no discharge. No scleral icterus.  Neck: No JVD, no carotid bruits  Cardiovascular: Irregularly irregular, no murmurs appreciated Pulmonary/Chest: Clear to auscultation bilaterally, no wheezes or rails Abdominal: Soft.  no distension.  no tenderness.  Musculoskeletal: Normal range of motion Neurological:  normal muscle tone. Coordination normal. No atrophy Skin: Skin warm and dry Psychiatric: normal affect, pleasant   Recent Labs: 11/10/2019: Hemoglobin 16.4; Platelets 274 01/11/2020: BUN 11; Creatinine, Ser 1.15; Potassium 4.2; Sodium 139    Lipid Panel No results found for: CHOL, HDL, LDLCALC, TRIG    Wt Readings from Last 3 Encounters:  08/08/20 210 lb 9.6 oz (95.5 kg)  06/17/20 208 lb (94.3 kg)  04/09/20 211 lb 2 oz (95.8 kg)     ASSESSMENT AND PLAN:  Problem List Items Addressed This Visit    None     Atrial fib, persistent On Xarelto, rate controlled on metoprolol Has been in atrial fibrillation past 2 years, Previously declined cardioversion secondary to psychosocial issues Echocardiogram has been ordered Would likely have difficult time restoring normal sinus rhythm  Chronic deep vein thrombosis (DVT) of proximal vein of left lower  extremity PE, in the past IVC filter was placed  Thrombus in the lower extremities treated by vascular  Chronic left hip pain Chronic issue  dislocated in the past We have referred him to orthopedics  Smoker We have encouraged him to continue to work on weaning his cigarettes and smoking cessation. He will continue to work on this and does not want any assistance with chantix.   Acute on chronic diastolic and systolic CHF Echocardiogram ordered Previous EF 25% Continue current medications, Low blood pressure limiting titration of other medications  Disposition:   F/U  6 month   Total encounter time more than 25 minutes  Greater than 50% was spent in counseling and coordination of care with the patient   Signed, Dossie Arbour, M.D., Ph.D. [facility] [place], [place] [phone]

## 2020-10-14 ENCOUNTER — Ambulatory Visit: Payer: Medicare Other | Admitting: Cardiovascular Disease

## 2020-10-14 DIAGNOSIS — I428 Other cardiomyopathies: Secondary | ICD-10-CM

## 2020-10-14 DIAGNOSIS — Z86711 Personal history of pulmonary embolism: Secondary | ICD-10-CM

## 2020-10-14 DIAGNOSIS — I4811 Longstanding persistent atrial fibrillation: Secondary | ICD-10-CM

## 2020-10-14 DIAGNOSIS — Z86718 Personal history of other venous thrombosis and embolism: Secondary | ICD-10-CM

## 2020-10-14 DIAGNOSIS — I5022 Chronic systolic (congestive) heart failure: Secondary | ICD-10-CM

## 2020-10-14 DIAGNOSIS — F17200 Nicotine dependence, unspecified, uncomplicated: Secondary | ICD-10-CM

## 2020-10-14 NOTE — Progress Notes (Deleted)
Cardiology Office Note  Date:  10/14/2020   ID:  Henry Dunn, DOB March 11, 1976, MRN [id number]  [facility]P:  [facility]   No chief complaint on file.   HPI:  Henry Dunn is a 44 year old gentleman with past medical history of Chronic smoker, shortness of breath atrial fibrillation with RVR February 23 2017 after having groin abscess debrided, started on warfarin RT leg thrombectomy   DVT April 2019, noncompliance with warfarin IVC filter in place Echo 01/2019 left ventricle has severely reduced systolic function, with an ejection fraction of 20-25%. Atrial fibrillation starting early 2019 He presents today for routine follow-up of his atrial fibrillation  Last seen in clinic May 2021 He was seen in clinic September 2021  Echocardiogram at that time ejection fraction 35 to 40% Global hypokinesis RV normal size with normal function Improvement from prior study March 2020 ejection fraction 20 to 25%   Presenting for preop clearance for left THA It was recommended if he has a spinal anesthesia that he hold anticoagulation 3 days For general could hold anticoagulation 2 days     Seen in clinic 01/11/2020, continued dyspnea on exertion shortness of breath Also reported having fatigue Smoking, alcohol 6-8 beers sometimes up to 12  Weight down 8-9 pounds Feels baseline weight 210, down from 219  Severe left hip pain  Reports he is taking Lasix 40 daily Sometimes extra lasix in the PM, for ABD bloating  on Xarelto  Chronic mild left leg swelling from prior DVT   Reports he has Medicare Medicaid, would like to get his hip fixed  EKG personally reviewed by myself on todays visit Atrial fib with rate 94 bpm  Other past medical hx Hospital admission March 2020 acute on chronic diastolic and systolic CHF Presented in atrial fibrillation with RVR Reported having 1 month of shortness of breath Ejection fraction 20 to 25%, was not seen by cardiology  EKG at [facility]  showing atrial fibrillation in February 2020  Several trips to the emergency room in February 2020  Echocardiogram March 2020  1. The left ventricle has severely reduced systolic function, with an ejection fraction of 20-25%. The cavity size was moderate to severely dilated. Left ventricular diastolic function could not be evaluated. Elevated left ventricular end-diastolic  pressure Left ventricular diffuse hypokinesis.  2. Left atrial size was moderately dilated.  3. Right atrial size was mildly dilated.  4. Mitral valve regurgitation is moderate by color flow Doppler.  March 2018 Was noted to be in atrial fibrillation/flutter Converted to normal sinus rhythm in the hospital In the hospital had paroxysmal A. fib/flutter, started on metoprolol CHADS2VASc at least 1 (vascular disease)  Echocardiogram 02/18/2017 - Left ventricle: The cavity size was mildly dilated. The estimated ejection fraction was in the range of 55% to 60%. Wall motion wasnormal - Left atrium: The atrium was mildly dilated.   PMH:   has a past medical history of A-fib (HCC), CHF (congestive heart failure) (HCC), DVT (deep venous thrombosis) (HCC), blood clots, and PE (pulmonary thromboembolism) (HCC).  PSH:    Past Surgical History:  Procedure Laterality Date  . INSERTION OF VENA CAVA FILTER    . PERIPHERAL VASCULAR THROMBECTOMY Right 03/18/2018   Procedure: PERIPHERAL VASCULAR THROMBECTOMY;  Surgeon: Renford Dills, MD;  Location: [facility] INVASIVE CV LAB;  Service: Cardiovascular;  Laterality: Right;    Current Outpatient Medications  Medication Sig Dispense Refill  . furosemide (LASIX) 40 MG tablet Take 1 tablet (40 mg total) by mouth 2 (  two) times daily. 180 tablet 3  . metoprolol succinate (TOPROL-XL) 100 MG 24 hr tablet Take 1 tablet (100 mg total) by mouth daily. 90 tablet 3  . spironolactone (ALDACTONE) 25 MG tablet TAKE 1/2 TABLET BY MOUTH EVERY DAY 45 tablet 0  . XARELTO 20 MG TABS tablet TAKE 1  TABLET(20 MG) BY MOUTH DAILY WITH SUPPER 90 tablet 1   No current facility-administered medications for this visit.    Allergies:   Patient has no known allergies.   Social History:  The patient  reports that he has been smoking cigarettes. He has been smoking about 0.25 packs per day. He has never used smokeless tobacco. He reports current alcohol use of about 12.0 standard drinks of alcohol per week. He reports previous drug use.   Family History:   family history is not on file.    Review of Systems: Review of Systems  Constitutional: Negative.   HENT: Negative.   Respiratory: Positive for shortness of breath.   Cardiovascular: Negative.   Gastrointestinal: Negative.        Abdominal fullness  Musculoskeletal: Negative.   Neurological: Negative.   Psychiatric/Behavioral: Negative.   All other systems reviewed and are negative.   PHYSICAL EXAM: VS:  There were no vitals taken for this visit. , BMI There is no height or weight on file to calculate BMI. Constitutional:  oriented to person, place, and time. No distress.  HENT:  Head: Grossly normal Eyes:  no discharge. No scleral icterus.  Neck: No JVD, no carotid bruits  Cardiovascular: Irregularly irregular, no murmurs appreciated Pulmonary/Chest: Clear to auscultation bilaterally, no wheezes or rails Abdominal: Soft.  no distension.  no tenderness.  Musculoskeletal: Normal range of motion Neurological:  normal muscle tone. Coordination normal. No atrophy Skin: Skin warm and dry Psychiatric: normal affect, pleasant   Recent Labs: 11/10/2019: Hemoglobin 16.4; Platelets 274 01/11/2020: BUN 11; Creatinine, Ser 1.15; Potassium 4.2; Sodium 139    Lipid Panel No results found for: CHOL, HDL, LDLCALC, TRIG    Wt Readings from Last 3 Encounters:  08/08/20 210 lb 9.6 oz (95.5 kg)  06/17/20 208 lb (94.3 kg)  04/09/20 211 lb 2 oz (95.8 kg)     ASSESSMENT AND PLAN:  Problem List Items Addressed This Visit    None      Atrial fib, persistent On Xarelto, rate controlled on metoprolol Has been in atrial fibrillation past 2 years, Previously declined cardioversion secondary to psychosocial issues Echocardiogram has been ordered Would likely have difficult time restoring normal sinus rhythm  Chronic deep vein thrombosis (DVT) of proximal vein of left lower extremity PE, in the past IVC filter was placed  Thrombus in the lower extremities treated by vascular  Chronic left hip pain Chronic issue  dislocated in the past We have referred him to orthopedics  Smoker We have encouraged him to continue to work on weaning his cigarettes and smoking cessation. He will continue to work on this and does not want any assistance with chantix.   Acute on chronic diastolic and systolic CHF Echocardiogram ordered Previous EF 25% Continue current medications, Low blood pressure limiting titration of other medications  Disposition:   F/U  6 month   Total encounter time more than 25 minutes  Greater than 50% was spent in counseling and coordination of care with the patient   Signed, Dossie Arbour, M.D., Ph.D. [facility] [place], [place] [phone]

## 2020-10-16 ENCOUNTER — Ambulatory Visit: Payer: Medicare Other | Admitting: Cardiovascular Disease

## 2020-10-16 DIAGNOSIS — Z86711 Personal history of pulmonary embolism: Secondary | ICD-10-CM

## 2020-10-16 DIAGNOSIS — I5022 Chronic systolic (congestive) heart failure: Secondary | ICD-10-CM

## 2020-10-16 DIAGNOSIS — I4811 Longstanding persistent atrial fibrillation: Secondary | ICD-10-CM

## 2020-10-16 DIAGNOSIS — I420 Dilated cardiomyopathy: Secondary | ICD-10-CM

## 2020-10-16 DIAGNOSIS — Z86718 Personal history of other venous thrombosis and embolism: Secondary | ICD-10-CM

## 2020-10-16 DIAGNOSIS — I428 Other cardiomyopathies: Secondary | ICD-10-CM

## 2020-10-16 DIAGNOSIS — F17200 Nicotine dependence, unspecified, uncomplicated: Secondary | ICD-10-CM

## 2020-10-16 DIAGNOSIS — I483 Typical atrial flutter: Secondary | ICD-10-CM

## 2020-11-13 ENCOUNTER — Other Ambulatory Visit: Payer: Self-pay | Admitting: Cardiovascular Disease

## 2020-11-13 NOTE — Telephone Encounter (Signed)
Please review for refill. Thanks!  

## 2020-11-13 NOTE — Telephone Encounter (Signed)
Pt's age 44, wt 95.5 kg, SCr 1.65, CrCl 77.17, last ov w/ RD 08/08/20.

## 2020-11-19 ENCOUNTER — Other Ambulatory Visit: Payer: Self-pay | Admitting: Orthopedic Surgery

## 2020-11-28 ENCOUNTER — Other Ambulatory Visit: Payer: Self-pay

## 2020-11-28 ENCOUNTER — Encounter
Admission: RE | Admit: 2020-11-28 | Discharge: 2020-11-28 | Disposition: A | Discharge status: Home / Self Care | Payer: Medicare Other | Source: Ambulatory Visit | Attending: Orthopedic Surgery | Admitting: Orthopedic Surgery

## 2020-11-28 DIAGNOSIS — I4892 Unspecified atrial flutter: Secondary | ICD-10-CM | POA: Diagnosis not present

## 2020-11-28 DIAGNOSIS — I444 Left anterior fascicular block: Secondary | ICD-10-CM | POA: Insufficient documentation

## 2020-11-28 DIAGNOSIS — I4891 Unspecified atrial fibrillation: Secondary | ICD-10-CM | POA: Diagnosis not present

## 2020-11-28 DIAGNOSIS — Z01818 Encounter for other preprocedural examination: Secondary | ICD-10-CM | POA: Insufficient documentation

## 2020-11-28 LAB — COMPREHENSIVE METABOLIC PANEL
ALT: 28 U/L (ref 0–44)
AST: 23 U/L (ref 15–41)
Albumin: 3.7 g/dL (ref 3.5–5.0)
Alkaline Phosphatase: 54 U/L (ref 38–126)
Anion gap: 9 (ref 5–15)
BUN: 17 mg/dL (ref 6–20)
CO2: 31 mmol/L (ref 22–32)
Calcium: 9 mg/dL (ref 8.9–10.3)
Chloride: 103 mmol/L (ref 98–111)
Creatinine, Ser: 1.12 mg/dL (ref 0.61–1.24)
GFR, Estimated: >60 mL/min (ref >60)
Glucose, Bld: 89 mg/dL (ref 70–99)
Potassium: 4 mmol/L (ref 3.5–5.1)
Sodium: 143 mmol/L (ref 135–145)
Total Bilirubin: 0.8 mg/dL (ref 0.3–1.2)
Total Protein: 6.9 g/dL (ref 6.5–8.1)

## 2020-11-28 LAB — TYPE AND SCREEN
ABO/RH(D): A NEG
Antibody Screen: NEGATIVE

## 2020-11-28 LAB — URINALYSIS, ROUTINE W REFLEX MICROSCOPIC
Bilirubin Urine: NEGATIVE
Glucose, UA: NEGATIVE mg/dL
Hgb urine dipstick: NEGATIVE
Ketones, ur: NEGATIVE mg/dL
Leukocytes,Ua: NEGATIVE
Nitrite: NEGATIVE
Protein, ur: NEGATIVE mg/dL
Specific Gravity, Urine: 1.028 (ref 1.005–1.030)
pH: 5 (ref 5.0–8.0)

## 2020-11-28 LAB — CBC WITH DIFFERENTIAL/PLATELET
Abs Immature Granulocytes: 0.02 10*3/uL (ref 0.00–0.07)
Basophils Absolute: 0 10*3/uL (ref 0.0–0.1)
Basophils Relative: 1 %
Eosinophils Absolute: 0.3 10*3/uL (ref 0.0–0.5)
Eosinophils Relative: 4 %
HCT: 48 % (ref 39.0–52.0)
Hemoglobin: 16.4 g/dL (ref 13.0–17.0)
Immature Granulocytes: 0 %
Lymphocytes Relative: 41 %
Lymphs Abs: 2.3 10*3/uL (ref 0.7–4.0)
MCH: 32.5 pg (ref 26.0–34.0)
MCHC: 34.2 g/dL (ref 30.0–36.0)
MCV: 95.2 fL (ref 80.0–100.0)
Monocytes Absolute: 0.6 10*3/uL (ref 0.1–1.0)
Monocytes Relative: 11 %
Neutro Abs: 2.5 10*3/uL (ref 1.7–7.7)
Neutrophils Relative %: 43 %
Platelets: 175 10*3/uL (ref 150–400)
RBC: 5.04 MIL/uL (ref 4.22–5.81)
RDW: 12.6 % (ref 11.5–15.5)
WBC: 5.7 10*3/uL (ref 4.0–10.5)
nRBC: 0 % (ref 0.0–0.2)

## 2020-11-28 NOTE — Patient Instructions (Signed)
Your procedure is scheduled on: Thursday December 05, 2020 Report to the Registration Desk on the 1st floor of the [facility] To find out your arrival time, please call [phone] between 1PM - 3PM on: December 04, 2020 Wednesday   REMEMBER: Instructions that are not followed completely may result in serious medical risk, up to and including death; or upon the discretion of your surgeon and anesthesiologist your surgery may need to be rescheduled.  Do not eat food after midnight the night before surgery.  No gum chewing, lozengers or hard candies.  You may however, drink CLEAR liquids up to 2 hours before you are scheduled to arrive for your surgery. Do not drink anything within 2 hours of your scheduled arrival time.  Clear liquids include: - water  - apple juice without pulp - gatorade (not RED, PURPLE, OR BLUE) - black coffee or tea (Do NOT add milk or creamers to the coffee or tea) Do NOT drink anything that is not on this list.  Type 1 and Type 2 diabetics should only drink water.  In addition, your doctor has ordered for you to drink the provided  Ensure Pre-Surgery Clear Carbohydrate Drink  Drinking this carbohydrate drink up to two hours before surgery helps to reduce insulin resistance and improve patient outcomes. Please complete drinking 2 hours prior to scheduled arrival time.  TAKE THESE MEDICATIONS THE MORNING OF SURGERY WITH A SIP OF WATER: NONE  Follow recommendations from Cardiologist, Pulmonologist or PCP regarding stopping Aspirin, Coumadin, Plavix, Eliquis, Pradaxa, or Pletal. LAST DOSE OF XARELTO December 01, 2020   One week prior to surgery: Stop Anti-inflammatories (NSAIDS) such as Advil, Aleve, Ibuprofen, Motrin, Naproxen, Naprosyn and Aspirin based products such as Excedrin, Goodys Powder, BC Powder.    USE TYLENOL ONLY  Stop ANY OVER THE COUNTER supplements until after surgery. (However, you may continue taking Vitamin D, Vitamin B, and multivitamin up  until the day before surgery.)  No Alcohol for 24 hours before or after surgery.  No Smoking including e-cigarettes for 24 hours prior to surgery.  No chewable tobacco products for at least 6 hours prior to surgery.  No nicotine patches on the day of surgery.  Do not use any "recreational" drugs for at least a week prior to your surgery.  Please be advised that the combination of cocaine and anesthesia may have negative outcomes, up to and including death. If you test positive for cocaine, your surgery will be cancelled.  On the morning of surgery brush your teeth with toothpaste and water, you may rinse your mouth with mouthwash if you wish. Do not swallow any toothpaste or mouthwash.  Do not wear jewelry, make-up, hairpins, clips or nail polish.  Do not wear lotions, powders, or perfumes OR DEODORANT OR AFTERSHAVE   Do not shave body from the neck down 48 hours prior to surgery just in case you cut yourself which could leave a site for infection.  Also, freshly shaved skin may become irritated if using the CHG soap.  Contact lenses, hearing aids and dentures may not be worn into surgery.  Do not bring valuables to the hospital. [facility] is not responsible for any missing/lost belongings or valuables.   Use CHG Soap as directed on instruction sheet.  Notify your doctor if there is any change in your medical condition (cold, fever, infection).  Wear comfortable clothing (specific to your surgery type) to the hospital.  Plan for stool softeners for home use; pain medications have a  tendency to cause constipation. You can also help prevent constipation by eating foods high in fiber such as fruits and vegetables and drinking plenty of fluids as your diet allows.  After surgery, you can help prevent lung complications by doing breathing exercises.  Take deep breaths and cough every 1-2 hours. Your doctor may order a device called an Incentive Spirometer to help you take deep  breaths. When coughing or sneezing, hold a pillow firmly against your incision with both hands. This is called "splinting." Doing this helps protect your incision. It also decreases belly discomfort.  If you are being admitted to the hospital overnight, YOU MAY BRING A SMALL BAG WITH YOU.   If you are being discharged the day of surgery, you will not be allowed to drive home. You will need a responsible adult (18 years or older) to drive you home and stay with you that night.   If you are taking public transportation, you will need to have a responsible adult (18 years or older) with you. Please confirm with your physician that it is acceptable to use public transportation.   Please call the Pre-admissions Testing Dept. at [phone] if you have any questions about these instructions.  Visitation Policy:  Patients undergoing a surgery or procedure may have one family member or support person with them as long as that person is not COVID-19 positive or experiencing its symptoms.  That person may remain in the waiting area during the procedure.  Inpatient Visitation Update:   In an effort to ensure the safety of our team members and our patients, we are implementing a change to our visitation policy:  Effective Monday, Aug. 9, at 7 a.m., inpatients will be allowed one support person.  o The support person may change daily.  o The support person must pass our screening, gel in and out, and wear a mask at all times, including in the patient's room.  o Patients must also wear a mask when staff or their support person are in the room.  o Masking is required regardless of vaccination status.  Systemwide, no visitors 17 or younger.

## 2020-11-29 LAB — SURGICAL PCR SCREEN
MRSA, PCR: NEGATIVE
Staphylococcus aureus: POSITIVE — AB

## 2020-12-02 ENCOUNTER — Encounter: Payer: Self-pay | Admitting: Orthopedic Surgery

## 2020-12-02 NOTE — Progress Notes (Unsigned)
Cardiology Office Note  Date:  12/03/2020   ID:  Henry Dunn, DOB [birth year], MRN [id number]  PCP:  [facility]   Chief Complaint  Patient presents with  . Follow-up    6 month F/U-Patient states he is scheduled for a left hip replacement on 12/05/2020.    HPI:  Henry Dunn is a 45 year old gentleman with past medical history of Left hip dislocation right lower extremity DVT in the past, had IVC filter Chronic smoker, shortness of breath developed atrial fibrillation with RVR February 23 2017 after having groin abscess debrided, started on warfarin RT leg thrombectomy    DVT April 2019, noncompliance with warfarin IVC filter in place Echo 01/2019 left ventricle has severely reduced systolic function, with an ejection fraction of 20-25%. Atrial fibrillation starting early 2019 He presents today for routine follow-up of his atrial fibrillation  Cutting back on cigarettes Anxious for surgery on his hip this Thursday  EF 35 to 40% on recent echocardiogram, climb from the 20th percentile Denies any shortness of breath, refill his Lasix daily  rarely taking extra Lasix  Reports cutting back on his smoking and alcohol Only had one today, has not finished 1 pack in the past 2 weeks Weight stable Rare abdominal bloating   on Xarelto  EKG personally reviewed by myself on todays visit Atrial fib with rate 89 bpm no significant ST or T wave changes  Other past medical hx Hospital admission March 2020 acute on chronic diastolic and systolic CHF Presented in atrial fibrillation with RVR Reported having 1 month of shortness of breath Ejection fraction 20 to 25%, was not seen by cardiology  EKG at [facility] showing atrial fibrillation in February 2020  Several trips to the emergency room in February 2020  Echocardiogram March 2020  1. The left ventricle has severely reduced systolic function, with an ejection fraction of 20-25%. The cavity size was moderate to severely  dilated. Left ventricular diastolic function could not be evaluated. Elevated left ventricular end-diastolic  pressure Left ventricular diffuse hypokinesis.  2. Left atrial size was moderately dilated.  3. Right atrial size was mildly dilated.  4. Mitral valve regurgitation is moderate by color flow Doppler.  March 2018 Was noted to be in atrial fibrillation/flutter Converted to normal sinus rhythm in the hospital In the hospital had paroxysmal A. fib/flutter, started on metoprolol CHADS2VASc at least 1 (vascular disease)  Echocardiogram 02/18/2017 - Left ventricle: The cavity size was mildly dilated. The estimated ejection fraction was in the range of 55% to 60%. Wall motion wasnormal - Left atrium: The atrium was mildly dilated.   PMH:   has a past medical history of A-fib (HCC), CHF (congestive heart failure) (HCC), Chronic anticoagulation, DVT (deep venous thrombosis) (HCC), blood clots, NICM (nonischemic cardiomyopathy) (HCC), PE (pulmonary thromboembolism) (HCC), and Systolic murmur.  PSH:    Past Surgical History:  Procedure Laterality Date  . INSERTION OF VENA CAVA FILTER    . PERIPHERAL VASCULAR THROMBECTOMY Right 03/18/2018   Procedure: PERIPHERAL VASCULAR THROMBECTOMY;  Surgeon: Renford Dills, MD;  Location: [facility] INVASIVE CV LAB;  Service: Cardiovascular;  Laterality: Right;    Current Outpatient Medications  Medication Sig Dispense Refill  . furosemide (LASIX) 20 MG tablet Takes 20mg  in the AM and an additional 20mg  in the evening if needed    . metoprolol succinate (TOPROL-XL) 100 MG 24 hr tablet Take 1 tablet (100 mg total) by mouth daily. 90 tablet 3  . spironolactone (ALDACTONE) 25 MG tablet TAKE  1/2 TABLET BY MOUTH EVERY DAY 45 tablet 0  . XARELTO 20 MG TABS tablet TAKE 1 TABLET(20 MG) BY MOUTH DAILY WITH SUPPER 90 tablet 1   No current facility-administered medications for this visit.    Allergies:   Patient has no known allergies.   Social History:  The  patient  reports that he has been smoking cigarettes. He has been smoking about 0.25 packs per day. He has never used smokeless tobacco. He reports current alcohol use of about 12.0 standard drinks of alcohol per week. He reports current drug use. Drugs: Methamphetamines and Cocaine.   Family History:   family history is not on file.    Review of Systems: Review of Systems  Constitutional: Negative.   HENT: Negative.   Respiratory: Negative.   Cardiovascular: Negative.   Gastrointestinal: Negative.   Musculoskeletal: Negative.   Neurological: Negative.   Psychiatric/Behavioral: Negative.   All other systems reviewed and are negative.   PHYSICAL EXAM: VS:  BP 110/74 (BP Location: Left Arm, Patient Position: Sitting, Cuff Size: Large)   Pulse 89   Ht 5\' 8"  (1.727 m)   Wt 211 lb (95.7 kg)   SpO2 96%   BMI 32.08 kg/m  , BMI Body mass index is 32.08 kg/m. Constitutional:  oriented to person, place, and time. No distress.  HENT:  Head: Grossly normal Eyes:  no discharge. No scleral icterus.  Neck: No JVD, no carotid bruits  Cardiovascular: Regular rate and rhythm, no murmurs appreciated Pulmonary/Chest: Clear to auscultation bilaterally, no wheezes or rails Abdominal: Soft.  no distension.  no tenderness.  Musculoskeletal: Normal range of motion Neurological:  normal muscle tone. Coordination normal. No atrophy Skin: Skin warm and dry Psychiatric: normal affect, pleasant   Recent Labs: 11/28/2020: ALT 28; BUN 17; Creatinine, Ser 1.12; Hemoglobin 16.4; Platelets 175; Potassium 4.0; Sodium 143    Lipid Panel No results found for: CHOL, HDL, LDLCALC, TRIG    Wt Readings from Last 3 Encounters:  12/03/20 211 lb (95.7 kg)  11/28/20 211 lb 8 oz (95.9 kg)  08/08/20 210 lb 9.6 oz (95.5 kg)     ASSESSMENT AND PLAN:  Problem List Items Addressed This Visit    Smoker    Other Visit Diagnoses    Chronic systolic heart failure (HCC)    -  Primary   Relevant Medications    furosemide (LASIX) 20 MG tablet   Other Relevant Orders   EKG 12-Lead   NICM (nonischemic cardiomyopathy) (HCC)       Relevant Medications   furosemide (LASIX) 20 MG tablet   Longstanding persistent atrial fibrillation (HCC)       Relevant Medications   furosemide (LASIX) 20 MG tablet   Other Relevant Orders   EKG 12-Lead   History of DVT (deep vein thrombosis)       History of pulmonary embolism       Persistent atrial fibrillation with RVR (HCC)       Relevant Medications   furosemide (LASIX) 20 MG tablet   Dilated cardiomyopathy (HCC)       Relevant Medications   furosemide (LASIX) 20 MG tablet     Preop cardiovascular valuation Acceptable risk for hip surgery on the left, no further testing needed, ejection fraction is improving, he is euvolemic He is cutting back on his smoking and drinking  Atrial fib, persistent On Xarelto, rate controlled on metoprolol Has been in atrial fibrillation past 2 years, Previously declined cardioversion secondary to psychosocial issues Improved ejection fraction  35 to 40%, stable  Chronic deep vein thrombosis (DVT) of proximal vein of left lower extremity PE, in the past IVC filter was placed  Thrombus in the lower extremities treated by vascular People Xarelto  Chronic left hip pain Chronic issue Surgery planned  Smoker We have encouraged him to continue to work on weaning his cigarettes and smoking cessation. He will continue to work on this and does not want any assistance with chantix.   Acute on chronic diastolic and systolic CHF Previous EF 25% up to 35 to 40%    Total encounter time more than 25 minutes  Greater than 50% was spent in counseling and coordination of care with the patient   Signed, Dossie Arbour, M.D., Ph.D. [facility] [place], [place] [phone]

## 2020-12-02 NOTE — Progress Notes (Signed)
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 Clinical nursing staff has updated patient's PMH/PSHx, current medication list, and drug allergies/intolerances to ensure comprehensive history available to assist in medical decision making as it pertains to the aforementioned surgical procedure and anticipated anesthetic course.   Clinical Discussion:  Henry Dunn is a 45 y.o. male who is submitted for pre-surgical anesthesia review and clearance prior to him undergoing the above procedure. Patient is a Current Smoker  Pertinent PMH includes: atrial fibrillation, HFrEF, NICM, systolic murmur, postsurgical VTE, chronic anticoagulant use.   Patient is followed by cardiology Mariah Milling, MD). He was last seen in the cardiology clinic on 08/08/2020; notes reviewed.  At the time of his clinic visit, patient doing well from a cardiovascular perspective.  He denied chest pain, shortness of breath, PND, orthopnea, palpitations, peripheral edema, vertiginous symptoms, and presyncope/syncope.  Patient with a PMH significant for tachycardia palpitations; diagnosed and admitted with new onset atrial flutter with RVR and 01/2017. CHA2DS2-VASc Score  = 2. Patient with multiple ED visits in 2020 for atrial fibrillation with RVR episodes.  Patient admitted and diagnosed with acute systolic heart failure in 01/2019.  TTE at that time revealed moderately to severely dilated left ventricular cavity, diffuse hypokinesis, moderately biatrial enlargement, and a reduced LVEF of 20-25%.  Patient was diuresed and rate control therapy was escalated.  CT angiography was negative for PE. Patient ultimately discharged home with plans for outpatient cardiology follow-up.  Patient seen by cardiology in 04/19/2019.  DCCV was discussed, however patient with financial concerns, and thus elected to continue with medical therapy.  Last TTE performed on 08/09/2020 revealed an improvement of the patient's LVEF to 35-40% (see full interpretation of cardiovascular testing below). Functional capacity, as defined by DASI, is documented as being >/= 4 METS.  Blood pressure well controlled at 128/88 on currently prescribed diuretic and beta-blocker therapy.  Patient remains on chronic anticoagulation (rivaroxaban) following a postsurgical VTE and 2006. Repeat BLE ultrasound in 07/2020 revealed chronic bilateral DVTs.  He is compliant with anticoagulation therapy without reported evidence of GI bleeding.  Patient has an IVC filter in place.  ECG performed in the office revealed rate controlled atrial fibrillation; 97 bpm.  Beta-blocker (metoprolol) dose was up titrated to 100 mg daily.  No other changes were made to patient's medication regimen. Patient to follow-up with outpatient cardiology in 2 months.  Patient scheduled for an elective orthopedic procedure on 12/05/2020 with Dr. Kennedy Bucker.  Given patient's past medical history significant for cardiovascular diagnoses, presurgical cardiac clearance was sought by the PAT team.  Per cardiology, "given past medical history and time since last visit, based on ACC/AHA guidelines, patient will be at an ACCEPTABLE  risk for the planned  procedure without further cardiovascular testing".  Again, this patient is on chronic anticoagulation therapy (rivaroxaban).  He has been instructed on recommendations from cardiology for holding his anticoagulant for 3 days prior to his procedure.  The patient has been instructed that his last dose of rivaroxaban will be on 12/01/2020.  He denies previous perioperative complications with anesthesia.  Vitals with BMI 11/28/2020 08/08/2020 06/17/2020  Height 5\' 8"  5\' 8"  -  Weight 211 lbs 8 oz 210 lbs 10 oz 208 lbs  BMI 32.17 32.03 -  Systolic 126 128  Diastolic 87 88 86  Pulse 79 97 92    Providers/Specialists:   NOTE: Primary physician provider listed below. Patient may have been seen by APP or partner within same practice.   PROVIDER ROLE / SPECIALTY LAST , MD Orthopedics (Surgeon)  06/26/2020  [facility] Primary Care Provider  ???  [facility], MD Cardiology  08/08/2020   Allergies:  Patient has no known allergies.  Current Home Medications:   No current facility-administered medications for this encounter.   . furosemide (LASIX) 40 MG tablet  . metoprolol succinate (TOPROL-XL) 100 MG 24 hr tablet  . spironolactone (ALDACTONE) 25 MG tablet  . XARELTO 20 MG TABS tablet   History:   Past Medical History:  Diagnosis Date  . A-fib (HCC)   . CHF (congestive heart failure) (HCC)   . DVT (deep venous thrombosis) (HCC)    a. In setting of dislocated hip, status post IVC filter, previously on Coumadin, noncompliant over the past 12 months  . Hx of blood clots    a. Patient denies any family history of clotting disorder, patient denies prior hypercoagulable workup  . PE (pulmonary thromboembolism) (HCC)    Past Surgical History:  Procedure Laterality Date  . INSERTION OF VENA CAVA FILTER    . PERIPHERAL VASCULAR THROMBECTOMY Right 03/18/2018   Procedure: PERIPHERAL VASCULAR THROMBECTOMY;  Surgeon: 10/08/2020, MD;   Location: [facility] INVASIVE CV LAB;  Service: Cardiovascular;  Laterality: Right;   Family History  Problem Relation Age of Onset  . Diabetes Neg Hx   . Hypertension Neg Hx    Social History   Tobacco Use  . Smoking status: Current Every Day Smoker    Packs/day: 0.25    Types: Cigarettes  . Smokeless tobacco: Never Used  Vaping Use  . Vaping Use: Never used  Substance Use Topics  . Alcohol use: Yes    Alcohol/week: 12.0 standard drinks    Types: 12 Cans of beer per week  . Drug use: Yes    Types: Methamphetamines, Cocaine    Pertinent Clinical Results:  LABS: Labs reviewed: Acceptable for surgery.  No visits with results within 3 Day(s) from this visit.  Latest known visit with results is:  Hospital Outpatient Visit on 11/28/2020  Component Date Value Ref Range Status  . WBC 11/28/2020 5.7  4.0 - 10.5 K/uL Final  . RBC 11/28/2020 5.04  4.22 - 5.81 MIL/uL Final  . Hemoglobin 11/28/2020 16.4  13.0 - 17.0 g/dL Final  . HCT 11/30/2020 48.0  39.0 - 52.0 % Final  . MCV 11/28/2020 95.2  80.0 - 100.0 fL Final  . MCH 11/28/2020 32.5  26.0 - 34.0 pg Final  . MCHC 11/28/2020 34.2  30.0 - 36.0 g/dL Final  . RDW 11/30/2020 12.6  11.5 - 15.5 % Final  . Platelets 11/28/2020 175  150 - 400 K/uL Final  .  nRBC 11/28/2020 0.0  0.0 - 0.2 % Final  . Neutrophils Relative % 11/28/2020 43  % Final  . Neutro Abs 11/28/2020 2.5  1.7 - 7.7 K/uL Final  . Lymphocytes Relative 11/28/2020 41  % Final  . Lymphs Abs 11/28/2020 2.3  0.7 - 4.0 K/uL Final  . Monocytes Relative 11/28/2020 11  % Final  . Monocytes Absolute 11/28/2020 0.6  0.1 - 1.0 K/uL Final  . Eosinophils Relative 11/28/2020 4  % Final  . Eosinophils Absolute 11/28/2020 0.3  0.0 - 0.5 K/uL Final  . Basophils Relative 11/28/2020 1  % Final  . Basophils Absolute 11/28/2020 0.0  0.0 - 0.1 K/uL Final  . Immature Granulocytes 11/28/2020 0  % Final  . Abs Immature Granulocytes 11/28/2020 0.02  0.00 - 0.07 K/uL Final   Performed at [facility], [address] [place], [place] [address]  . Sodium 11/28/2020 143  135 - 145 mmol/L Final  . Potassium 11/28/2020 4.0  3.5 - 5.1 mmol/L Final  . Chloride 11/28/2020 103  98 - 111 mmol/L Final  . CO2 11/28/2020 31  22 - 32 mmol/L Final  . Glucose, Bld 11/28/2020 89  70 - 99 mg/dL Final   Glucose reference range applies only to samples taken after fasting for at least 8 hours.  . BUN 11/28/2020 17  6 - 20 mg/dL Final  . Creatinine, Ser 11/28/2020 1.12  0.61 - 1.24 mg/dL Final  . Calcium 11/28/2020 9.0  8.9 - 10.3 mg/dL Final  . Total Protein 11/28/2020 6.9  6.5 - 8.1 g/dL Final  . Albumin 11/28/2020 3.7  3.5 - 5.0 g/dL Final  . AST 11/28/2020 23  15 - 41 U/L Final  . ALT 11/28/2020 28  0 - 44 U/L Final  . Alkaline Phosphatase 11/28/2020 54  38 - 126 U/L Final  . Total Bilirubin 11/28/2020 0.8  0.3 - 1.2 mg/dL Final  . GFR, Estimated 11/28/2020 >60  >60 mL/min Final   Comment: (NOTE) Calculated using the CKD-EPI Creatinine Equation (2021)   . Anion gap 11/28/2020 9  5 - 15 Final   Performed at [facility], [address] [place], [place] [address]  . Color, Urine 11/28/2020 YELLOW* YELLOW Final  . APPearance 11/28/2020 CLEAR* CLEAR Final  . Specific Gravity, Urine 11/28/2020 1.028  1.005 - 1.030 Final  . pH 11/28/2020 5.0  5.0 - 8.0 Final  . Glucose, UA 11/28/2020 NEGATIVE  NEGATIVE mg/dL Final  . Hgb urine dipstick 11/28/2020 NEGATIVE  NEGATIVE Final  . Bilirubin Urine 11/28/2020 NEGATIVE  NEGATIVE Final  . Ketones, ur 11/28/2020 NEGATIVE  NEGATIVE mg/dL Final  . Protein, ur 11/28/2020 NEGATIVE  NEGATIVE mg/dL Final  . Nitrite 11/28/2020 NEGATIVE  NEGATIVE Final  . Chalmers Guest 11/28/2020 NEGATIVE  NEGATIVE Final   Performed at [facility], [address] [place], [place] [address]  . ABO/RH(D) 11/28/2020 A NEG   Final  . Antibody Screen 11/28/2020 NEG   Final  . Sample Expiration 11/28/2020 12/12/2020,2359   Final  . Extend sample reason  11/28/2020    Final                   Value:NO TRANSFUSIONS OR PREGNANCY IN THE PAST 3 MONTHS Performed at [facility], [address] [place], [place] [address]   . MRSA, PCR 11/28/2020 NEGATIVE  NEGATIVE Final  . Staphylococcus aureus 11/28/2020 POSITIVE* NEGATIVE Final   Comment: (NOTE) The Xpert SA Assay (FDA approved for NASAL specimens in  patients 98 years of age and older), is one component of a comprehensive surveillance program. It is not intended to diagnose infection nor to guide or monitor treatment. Performed at [facility], [address] [place], [place] [address]     ECG: Date: 11/28/2020 Time ECG obtained: 1416 PM Rate: 83 bpm Rhythm: Atrial flutter with variable AV block Axis (leads I and aVF): Normal Intervals: QRS 96 ms. QTc 423 ms. ST segment and T wave changes: No evidence of acute ST segment elevation or depression Comparison: Similar to previous tracing obtained on 08/08/2020   IMAGING / PROCEDURES: VASCULAR ULTRASOUND BILATERAL LOWER EXTREMITIES performed on 08/09/2020 1. RIGHT:  Findings consistent with chronic deep vein thrombosis involving the right femoral vein, and right popliteal vein.   Findings appear essentially unchanged compared to previous examination.   The peroneal veins were dilated but patent  2. LEFT:   Findings consistent with chronic deep vein thrombosis involving the left femoral vein, and left popliteal vein.   Findings appear essentially unchanged compared to previous examination.   Residual DVT in the CFV.   The peroneal veins were dilated but patent   ECHOCARDIOGRAM performed on 08/09/2020 1. Left ventricular ejection fraction, by estimation, is 35 to 40%.  2. The left ventricle has moderately decreased function.  3. The left ventricle demonstrates global hypokinesis.  4. Left ventricular diastolic parameters are indeterminate.  5. Right ventricular systolic function is normal.  6. The right  ventricular size is normal.  7. Tricuspid regurgitation signal is inadequate for assessing PA pressure.  8. Left atrial size was mildly dilated.  9. Mild mitral valve regurgitation.    Impression and Plan:  Henry Dunn has been referred for pre-anesthesia review and clearance prior to him undergoing the planned anesthetic and procedural courses. Available labs, pertinent testing, and imaging results were personally reviewed by me. This patient has been appropriately cleared by cardiology with an overall ACCEPTABLE risk for perioperative cardiovascular complications.   Based on clinical review performed today (12/02/20), barring any significant acute changes in the patient's overall condition, it is anticipated that he will be able to proceed with the planned surgical intervention. Any acute changes in clinical condition may necessitate his procedure being postponed and/or cancelled. Pre-surgical instructions were reviewed with the patient during his PAT appointment and questions were fielded by PAT clinical staff.  Quentin Mulling, MSN, APRN, FNP-C, CEN [facility]  Peri-operative Services Nurse Practitioner Phone: [phone] 12/02/20 8:43 AM  NOTE: This note has been prepared using Dragon dictation software. Despite my best ability to proofread, there is always the potential that unintentional transcriptional errors may still occur from this process.

## 2020-12-03 ENCOUNTER — Other Ambulatory Visit
Admission: RE | Admit: 2020-12-03 | Discharge: 2020-12-03 | Disposition: A | Discharge status: Home / Self Care | Payer: Medicare Other | Source: Ambulatory Visit | Attending: Orthopedic Surgery | Admitting: Orthopedic Surgery

## 2020-12-03 ENCOUNTER — Ambulatory Visit (INDEPENDENT_AMBULATORY_CARE_PROVIDER_SITE_OTHER): Payer: Medicare Other | Admitting: Cardiovascular Disease

## 2020-12-03 ENCOUNTER — Encounter: Payer: Self-pay | Admitting: Cardiovascular Disease

## 2020-12-03 ENCOUNTER — Other Ambulatory Visit: Payer: Self-pay

## 2020-12-03 VITALS — BP 110/74 | HR 89 | Ht 68.0 in | Wt 211.0 lb

## 2020-12-03 DIAGNOSIS — I420 Dilated cardiomyopathy: Secondary | ICD-10-CM

## 2020-12-03 DIAGNOSIS — I428 Other cardiomyopathies: Secondary | ICD-10-CM | POA: Diagnosis not present

## 2020-12-03 DIAGNOSIS — I4811 Longstanding persistent atrial fibrillation: Secondary | ICD-10-CM

## 2020-12-03 DIAGNOSIS — I5022 Chronic systolic (congestive) heart failure: Secondary | ICD-10-CM

## 2020-12-03 DIAGNOSIS — Z20822 Contact with and (suspected) exposure to covid-19: Secondary | ICD-10-CM | POA: Insufficient documentation

## 2020-12-03 DIAGNOSIS — I4819 Other persistent atrial fibrillation: Secondary | ICD-10-CM

## 2020-12-03 DIAGNOSIS — Z01812 Encounter for preprocedural laboratory examination: Secondary | ICD-10-CM | POA: Insufficient documentation

## 2020-12-03 DIAGNOSIS — F17200 Nicotine dependence, unspecified, uncomplicated: Secondary | ICD-10-CM

## 2020-12-03 DIAGNOSIS — Z86711 Personal history of pulmonary embolism: Secondary | ICD-10-CM

## 2020-12-03 DIAGNOSIS — Z86718 Personal history of other venous thrombosis and embolism: Secondary | ICD-10-CM

## 2020-12-03 LAB — SURGICAL PCR SCREEN
MRSA, PCR: NEGATIVE
Staphylococcus aureus: POSITIVE — AB

## 2020-12-03 NOTE — Patient Instructions (Signed)
Medication Instructions:  No changes  If you need a refill on your cardiac medications before your next appointment, please call your pharmacy.    Lab work: No new labs needed   If you have labs (blood work) drawn today and your tests are completely normal, you will receive your results only by: . MyChart Message (if you have MyChart) OR . A paper copy in the mail If you have any lab test that is abnormal or we need to change your treatment, we will call you to review the results.   Testing/Procedures: No new testing needed   Follow-Up: At [facility], you and your health needs are our priority.  As part of our continuing mission to provide you with exceptional heart care, we have created designated Provider Care Teams.  These Care Teams include your primary Cardiologist (physician) and Advanced Practice Providers (APPs -  Physician Assistants and Nurse Practitioners) who all work together to provide you with the care you need, when you need it.  . You will need a follow up appointment in 6 months  . Providers on your designated Care Team:   . Christopher Berge, NP . Ryan Dunn, PA-C . Jacquelyn Visser, PA-C  Any Other Special Instructions Will Be Listed Below (If Applicable).  COVID-19 Vaccine Information can be found at: [email or web address] For questions related to vaccine distribution or appointments, please email [email or web address] or call [phone].     

## 2020-12-04 LAB — SARS CORONAVIRUS 2 (TAT 6-24 HRS): SARS Coronavirus 2: NEGATIVE

## 2020-12-05 ENCOUNTER — Inpatient Hospital Stay
Admission: RE | Admit: 2020-12-05 | Discharge: 2020-12-07 | DRG: 470 | Disposition: A | Discharge status: Home Health | Payer: Medicare Other | Attending: Orthopedic Surgery | Admitting: Orthopedic Surgery

## 2020-12-05 ENCOUNTER — Other Ambulatory Visit: Payer: Self-pay

## 2020-12-05 ENCOUNTER — Inpatient Hospital Stay: Payer: Medicare Other | Admitting: Urgent Care

## 2020-12-05 ENCOUNTER — Encounter
Admission: RE | Disposition: A | Discharge status: Home Health | Payer: Self-pay | Source: Home / Self Care | Attending: Orthopedic Surgery

## 2020-12-05 ENCOUNTER — Encounter: Payer: Self-pay | Admitting: Orthopedic Surgery

## 2020-12-05 ENCOUNTER — Inpatient Hospital Stay: Payer: Medicare Other

## 2020-12-05 DIAGNOSIS — Z419 Encounter for procedure for purposes other than remedying health state, unspecified: Secondary | ICD-10-CM

## 2020-12-05 DIAGNOSIS — S73005S Unspecified dislocation of left hip, sequela: Secondary | ICD-10-CM

## 2020-12-05 DIAGNOSIS — I5022 Chronic systolic (congestive) heart failure: Secondary | ICD-10-CM | POA: Diagnosis present

## 2020-12-05 DIAGNOSIS — Z96642 Presence of left artificial hip joint: Secondary | ICD-10-CM

## 2020-12-05 DIAGNOSIS — I110 Hypertensive heart disease with heart failure: Secondary | ICD-10-CM | POA: Diagnosis present

## 2020-12-05 DIAGNOSIS — W14XXXS Fall from tree, sequela: Secondary | ICD-10-CM

## 2020-12-05 DIAGNOSIS — I428 Other cardiomyopathies: Secondary | ICD-10-CM | POA: Diagnosis present

## 2020-12-05 DIAGNOSIS — Z95828 Presence of other vascular implants and grafts: Secondary | ICD-10-CM

## 2020-12-05 DIAGNOSIS — R269 Unspecified abnormalities of gait and mobility: Secondary | ICD-10-CM | POA: Diagnosis present

## 2020-12-05 DIAGNOSIS — Z79899 Other long term (current) drug therapy: Secondary | ICD-10-CM

## 2020-12-05 DIAGNOSIS — Z7901 Long term (current) use of anticoagulants: Secondary | ICD-10-CM

## 2020-12-05 DIAGNOSIS — G8918 Other acute postprocedural pain: Secondary | ICD-10-CM

## 2020-12-05 DIAGNOSIS — M1652 Unilateral post-traumatic osteoarthritis, left hip: Principal | ICD-10-CM | POA: Diagnosis present

## 2020-12-05 DIAGNOSIS — F17210 Nicotine dependence, cigarettes, uncomplicated: Secondary | ICD-10-CM | POA: Diagnosis present

## 2020-12-05 DIAGNOSIS — I4891 Unspecified atrial fibrillation: Secondary | ICD-10-CM | POA: Diagnosis present

## 2020-12-05 DIAGNOSIS — I825Y2 Chronic embolism and thrombosis of unspecified deep veins of left proximal lower extremity: Secondary | ICD-10-CM | POA: Diagnosis present

## 2020-12-05 DIAGNOSIS — Z96649 Presence of unspecified artificial hip joint: Secondary | ICD-10-CM

## 2020-12-05 DIAGNOSIS — Z86711 Personal history of pulmonary embolism: Secondary | ICD-10-CM

## 2020-12-05 HISTORY — DX: Long term (current) use of anticoagulants: Z79.01

## 2020-12-05 HISTORY — PX: TOTAL HIP ARTHROPLASTY: SHX124

## 2020-12-05 HISTORY — DX: Cardiac murmur, unspecified: R01.1

## 2020-12-05 HISTORY — DX: Other cardiomyopathies: I42.8

## 2020-12-05 LAB — URINE DRUG SCREEN, QUALITATIVE (ARMC ONLY)
Amphetamines, Ur Screen: NOT DETECTED
Barbiturates, Ur Screen: NOT DETECTED
Benzodiazepine, Ur Scrn: NOT DETECTED
Cannabinoid 50 Ng, Ur ~~LOC~~: NOT DETECTED
Cocaine Metabolite,Ur ~~LOC~~: NOT DETECTED
MDMA (Ecstasy)Ur Screen: NOT DETECTED
Methadone Scn, Ur: NOT DETECTED
Opiate, Ur Screen: NOT DETECTED
Phencyclidine (PCP) Ur S: NOT DETECTED
Tricyclic, Ur Screen: NOT DETECTED

## 2020-12-05 LAB — ABO/RH: ABO/RH(D): A NEG

## 2020-12-05 SURGERY — ARTHROPLASTY, HIP, TOTAL, ANTERIOR APPROACH
Anesthesia: Spinal | Site: Hip | Laterality: Left

## 2020-12-05 MED ORDER — PROPOFOL 10 MG/ML IV BOLUS
INTRAVENOUS | Status: DC | PRN
Start: 2020-12-05 — End: 2020-12-05
  Administered 2020-12-05: 30 mg via INTRAVENOUS
  Administered 2020-12-05: 70 mg via INTRAVENOUS
  Administered 2020-12-05 (×2): 30 mg via INTRAVENOUS

## 2020-12-05 MED ORDER — TRAMADOL HCL 50 MG PO TABS: 50.0000 mg | ORAL_TABLET | Freq: Four times a day (QID) | ORAL | Status: DC

## 2020-12-05 MED ORDER — LACTATED RINGERS IV BOLUS
250.0000 mL | Freq: Once | INTRAVENOUS | Status: AC
  Administered 2020-12-05: 250 mL via INTRAVENOUS

## 2020-12-05 MED ORDER — OXYCODONE HCL 5 MG PO TABS
5.0000 mg | ORAL_TABLET | Freq: Once | ORAL | Status: AC | PRN
  Administered 2020-12-05: 5 mg via ORAL

## 2020-12-05 MED ORDER — ZOLPIDEM TARTRATE 5 MG PO TABS: 5.0000 mg | ORAL_TABLET | Freq: Every evening | ORAL | Status: DC | PRN

## 2020-12-05 MED ORDER — METOCLOPRAMIDE HCL 10 MG PO TABS: 5.0000 mg | ORAL_TABLET | Freq: Three times a day (TID) | ORAL | Status: DC | PRN

## 2020-12-05 MED ORDER — ACETAMINOPHEN 10 MG/ML IV SOLN
INTRAVENOUS | Status: AC
  Filled 2020-12-05: qty 100

## 2020-12-05 MED ORDER — OXYCODONE HCL 5 MG PO TABS: 10.0000 mg | ORAL_TABLET | ORAL | Status: DC | PRN

## 2020-12-05 MED ORDER — MENTHOL 3 MG MT LOZG: 1.0000 | LOZENGE | OROMUCOSAL | Status: DC | PRN

## 2020-12-05 MED ORDER — METOPROLOL SUCCINATE ER 25 MG PO TB24
100.0000 mg | ORAL_TABLET | Freq: Every day | ORAL | Status: DC
  Administered 2020-12-05 – 2020-12-07 (×3): 100 mg via ORAL
  Filled 2020-12-05 (×3): qty 4

## 2020-12-05 MED ORDER — ALUM & MAG HYDROXIDE-SIMETH 200-200-20 MG/5ML PO SUSP: 30.0000 mL | ORAL | Status: DC | PRN

## 2020-12-05 MED ORDER — FENTANYL CITRATE (PF) 100 MCG/2ML IJ SOLN
INTRAMUSCULAR | Status: DC | PRN
  Administered 2020-12-05: 25 ug via INTRAVENOUS

## 2020-12-05 MED ORDER — ENOXAPARIN SODIUM 40 MG/0.4ML ~~LOC~~ SOLN: 40.0000 mg | Freq: Once | SUBCUTANEOUS | Status: AC

## 2020-12-05 MED ORDER — DEXAMETHASONE SODIUM PHOSPHATE 10 MG/ML IJ SOLN
INTRAMUSCULAR | Status: AC
  Filled 2020-12-05: qty 1

## 2020-12-05 MED ORDER — BUPIVACAINE-EPINEPHRINE 0.25% -1:200000 IJ SOLN
INTRAMUSCULAR | Status: DC | PRN
  Administered 2020-12-05: 30 mL

## 2020-12-05 MED ORDER — SODIUM CHLORIDE 0.9 % IV SOLN: INTRAVENOUS | Status: DC

## 2020-12-05 MED ORDER — PHENOL 1.4 % MT LIQD
1.0000 | OROMUCOSAL | Status: DC | PRN
  Filled 2020-12-05: qty 177

## 2020-12-05 MED ORDER — METOCLOPRAMIDE HCL 5 MG/ML IJ SOLN: 5.0000 mg | Freq: Three times a day (TID) | INTRAMUSCULAR | Status: DC | PRN

## 2020-12-05 MED ORDER — ONDANSETRON HCL 4 MG PO TABS: 4.0000 mg | ORAL_TABLET | Freq: Four times a day (QID) | ORAL | Status: DC | PRN

## 2020-12-05 MED ORDER — PROPOFOL 500 MG/50ML IV EMUL
INTRAVENOUS | Status: DC | PRN
  Administered 2020-12-05: 100 ug/kg/min via INTRAVENOUS

## 2020-12-05 MED ORDER — METHOCARBAMOL 500 MG PO TABS
500.0000 mg | ORAL_TABLET | Freq: Four times a day (QID) | ORAL | Status: DC | PRN
Start: 2020-12-05 — End: 2020-12-05

## 2020-12-05 MED ORDER — RIVAROXABAN 20 MG PO TABS
20.0000 mg | ORAL_TABLET | Freq: Every day | ORAL | Status: DC
  Administered 2020-12-06 – 2020-12-07 (×2): 20 mg via ORAL
  Filled 2020-12-05 (×4): qty 1

## 2020-12-05 MED ORDER — FENTANYL CITRATE (PF) 100 MCG/2ML IJ SOLN
INTRAMUSCULAR | Status: AC
  Filled 2020-12-05: qty 2

## 2020-12-05 MED ORDER — DOCUSATE SODIUM 100 MG PO CAPS
100.0000 mg | ORAL_CAPSULE | Freq: Two times a day (BID) | ORAL | Status: DC
  Administered 2020-12-05 – 2020-12-07 (×4): 100 mg via ORAL
  Filled 2020-12-05 (×5): qty 1

## 2020-12-05 MED ORDER — TRAMADOL HCL 50 MG PO TABS
ORAL_TABLET | ORAL | Status: AC
  Administered 2020-12-05: 50 mg via ORAL
  Filled 2020-12-05: qty 1

## 2020-12-05 MED ORDER — CEFAZOLIN SODIUM-DEXTROSE 2-4 GM/100ML-% IV SOLN
INTRAVENOUS | Status: AC
  Administered 2020-12-05: 2 g via INTRAVENOUS
  Filled 2020-12-05: qty 100

## 2020-12-05 MED ORDER — LACTATED RINGERS IV SOLN: INTRAVENOUS | Status: DC

## 2020-12-05 MED ORDER — SODIUM CHLORIDE 0.9% FLUSH
INTRAVENOUS | Status: DC | PRN
  Administered 2020-12-05: 40 mL

## 2020-12-05 MED ORDER — ONDANSETRON HCL 4 MG/2ML IJ SOLN
INTRAMUSCULAR | Status: DC | PRN
  Administered 2020-12-05: 4 mg via INTRAVENOUS

## 2020-12-05 MED ORDER — TRAMADOL HCL 50 MG PO TABS
50.0000 mg | ORAL_TABLET | Freq: Four times a day (QID) | ORAL | Status: DC
Start: 2020-12-05 — End: 2020-12-07
  Administered 2020-12-05 – 2020-12-07 (×4): 50 mg via ORAL

## 2020-12-05 MED ORDER — MAGNESIUM CITRATE PO SOLN
1.0000 | Freq: Once | ORAL | Status: DC | PRN
  Filled 2020-12-05: qty 296

## 2020-12-05 MED ORDER — ONDANSETRON HCL 4 MG/2ML IJ SOLN: 4.0000 mg | Freq: Four times a day (QID) | INTRAMUSCULAR | Status: DC | PRN

## 2020-12-05 MED ORDER — METHOCARBAMOL 500 MG PO TABS
500.0000 mg | ORAL_TABLET | Freq: Four times a day (QID) | ORAL | Status: DC | PRN
  Administered 2020-12-06 – 2020-12-07 (×2): 500 mg via ORAL

## 2020-12-05 MED ORDER — BUPIVACAINE HCL (PF) 0.5 % IJ SOLN
INTRAMUSCULAR | Status: AC
  Filled 2020-12-05: qty 10

## 2020-12-05 MED ORDER — METHOCARBAMOL 500 MG PO TABS
ORAL_TABLET | ORAL | Status: AC
  Administered 2020-12-05: 500 mg via ORAL
  Filled 2020-12-05: qty 1

## 2020-12-05 MED ORDER — ACETAMINOPHEN 325 MG PO TABS: 325.0000 mg | ORAL_TABLET | Freq: Four times a day (QID) | ORAL | Status: DC | PRN

## 2020-12-05 MED ORDER — OXYCODONE HCL 5 MG PO TABS: 5.0000 mg | ORAL_TABLET | ORAL | Status: DC | PRN

## 2020-12-05 MED ORDER — NEOMYCIN-POLYMYXIN B GU 40-200000 IR SOLN
Status: AC
  Filled 2020-12-05: qty 2

## 2020-12-05 MED ORDER — LIDOCAINE HCL (PF) 2 % IJ SOLN
INTRAMUSCULAR | Status: AC
  Filled 2020-12-05: qty 5

## 2020-12-05 MED ORDER — MIDAZOLAM HCL 5 MG/5ML IJ SOLN
INTRAMUSCULAR | Status: DC | PRN
  Administered 2020-12-05: 2 mg via INTRAVENOUS

## 2020-12-05 MED ORDER — METHOCARBAMOL 1000 MG/10ML IJ SOLN: 500.0000 mg | Freq: Four times a day (QID) | INTRAVENOUS | Status: DC | PRN

## 2020-12-05 MED ORDER — LIDOCAINE HCL (CARDIAC) PF 100 MG/5ML IV SOSY
PREFILLED_SYRINGE | INTRAVENOUS | Status: DC | PRN
  Administered 2020-12-05: 50 mg via INTRAVENOUS

## 2020-12-05 MED ORDER — SODIUM CHLORIDE 0.9 % IV SOLN
INTRAVENOUS | Status: DC | PRN
  Administered 2020-12-05: 100 ug/min via INTRAVENOUS

## 2020-12-05 MED ORDER — FAMOTIDINE 20 MG PO TABS
ORAL_TABLET | ORAL | Status: AC
  Administered 2020-12-05: 20 mg via ORAL
  Filled 2020-12-05: qty 1

## 2020-12-05 MED ORDER — FUROSEMIDE 20 MG PO TABS
20.0000 mg | ORAL_TABLET | Freq: Every day | ORAL | Status: DC
  Administered 2020-12-05 – 2020-12-07 (×3): 20 mg via ORAL
  Filled 2020-12-05 (×3): qty 1

## 2020-12-05 MED ORDER — ORAL CARE MOUTH RINSE: 15.0000 mL | Freq: Once | OROMUCOSAL | Status: AC

## 2020-12-05 MED ORDER — CEFAZOLIN SODIUM-DEXTROSE 2-4 GM/100ML-% IV SOLN
INTRAVENOUS | Status: AC
  Filled 2020-12-05: qty 100

## 2020-12-05 MED ORDER — LACTATED RINGERS IV BOLUS
500.0000 mL | Freq: Once | INTRAVENOUS | Status: AC
  Administered 2020-12-05: 500 mL via INTRAVENOUS

## 2020-12-05 MED ORDER — CEFAZOLIN SODIUM-DEXTROSE 2-4 GM/100ML-% IV SOLN: 2.0000 g | Freq: Four times a day (QID) | INTRAVENOUS | Status: DC

## 2020-12-05 MED ORDER — ONDANSETRON HCL 4 MG/2ML IJ SOLN
INTRAMUSCULAR | Status: AC
  Filled 2020-12-05: qty 2

## 2020-12-05 MED ORDER — FENTANYL CITRATE (PF) 100 MCG/2ML IJ SOLN: 25.0000 ug | INTRAMUSCULAR | Status: DC | PRN

## 2020-12-05 MED ORDER — SPIRONOLACTONE 25 MG PO TABS
12.5000 mg | ORAL_TABLET | Freq: Every day | ORAL | Status: DC
  Administered 2020-12-05 – 2020-12-06 (×2): 12.5 mg via ORAL
  Filled 2020-12-05 (×3): qty 0.5

## 2020-12-05 MED ORDER — NEOMYCIN-POLYMYXIN B GU 40-200000 IR SOLN
Status: DC | PRN
  Administered 2020-12-05: 4 mL

## 2020-12-05 MED ORDER — BUPIVACAINE LIPOSOME 1.3 % IJ SUSP
INTRAMUSCULAR | Status: AC
  Filled 2020-12-05: qty 20

## 2020-12-05 MED ORDER — ACETAMINOPHEN 325 MG PO TABS
ORAL_TABLET | ORAL | Status: AC
  Administered 2020-12-05: 650 mg via ORAL
  Filled 2020-12-05: qty 2

## 2020-12-05 MED ORDER — BUPIVACAINE HCL (PF) 0.5 % IJ SOLN
INTRAMUSCULAR | Status: DC | PRN
  Administered 2020-12-05: 2.6 mL via INTRATHECAL

## 2020-12-05 MED ORDER — METHOCARBAMOL 1000 MG/10ML IJ SOLN
500.0000 mg | Freq: Four times a day (QID) | INTRAVENOUS | Status: DC | PRN
  Filled 2020-12-05: qty 5

## 2020-12-05 MED ORDER — OXYCODONE HCL 5 MG/5ML PO SOLN: 5.0000 mg | Freq: Once | ORAL | Status: AC | PRN

## 2020-12-05 MED ORDER — BISACODYL 10 MG RE SUPP
10.0000 mg | Freq: Every day | RECTAL | Status: DC | PRN
  Filled 2020-12-05: qty 1

## 2020-12-05 MED ORDER — ENOXAPARIN SODIUM 40 MG/0.4ML ~~LOC~~ SOLN
SUBCUTANEOUS | Status: AC
  Administered 2020-12-05: 40 mg via SUBCUTANEOUS
  Filled 2020-12-05: qty 0.4

## 2020-12-05 MED ORDER — HYDROMORPHONE HCL 1 MG/ML IJ SOLN: 0.5000 mg | INTRAMUSCULAR | Status: DC | PRN

## 2020-12-05 MED ORDER — OXYCODONE HCL 5 MG PO TABS
ORAL_TABLET | ORAL | Status: AC
  Filled 2020-12-05: qty 1

## 2020-12-05 MED ORDER — TRAMADOL HCL 50 MG PO TABS
ORAL_TABLET | ORAL | Status: AC
  Filled 2020-12-05: qty 1

## 2020-12-05 MED ORDER — SODIUM CHLORIDE FLUSH 0.9 % IV SOLN
INTRAVENOUS | Status: AC
  Filled 2020-12-05: qty 10

## 2020-12-05 MED ORDER — SODIUM CHLORIDE 0.9 % IV SOLN
INTRAVENOUS | Status: DC | PRN
  Administered 2020-12-05: 60 mL

## 2020-12-05 MED ORDER — PHENOL 1.4 % MT LIQD: 1.0000 | OROMUCOSAL | Status: DC | PRN

## 2020-12-05 MED ORDER — ACETAMINOPHEN 10 MG/ML IV SOLN
INTRAVENOUS | Status: DC | PRN
  Administered 2020-12-05: 1000 mg via INTRAVENOUS

## 2020-12-05 MED ORDER — DOCUSATE SODIUM 100 MG PO CAPS: 100.0000 mg | ORAL_CAPSULE | Freq: Two times a day (BID) | ORAL | Status: DC

## 2020-12-05 MED ORDER — BUPIVACAINE HCL (PF) 0.5 % IJ SOLN
INTRAMUSCULAR | Status: AC
  Filled 2020-12-05: qty 30

## 2020-12-05 MED ORDER — PHENYLEPHRINE HCL (PRESSORS) 10 MG/ML IV SOLN
INTRAVENOUS | Status: DC | PRN
  Administered 2020-12-05 (×3): 200 ug via INTRAVENOUS

## 2020-12-05 MED ORDER — PROPOFOL 500 MG/50ML IV EMUL
INTRAVENOUS | Status: AC
  Filled 2020-12-05: qty 50

## 2020-12-05 MED ORDER — PROPOFOL 10 MG/ML IV BOLUS
INTRAVENOUS | Status: AC
  Filled 2020-12-05: qty 60

## 2020-12-05 MED ORDER — CEFAZOLIN SODIUM-DEXTROSE 2-4 GM/100ML-% IV SOLN: 2.0000 g | Freq: Four times a day (QID) | INTRAVENOUS | Status: AC

## 2020-12-05 MED ORDER — DIPHENHYDRAMINE HCL 12.5 MG/5ML PO ELIX
12.5000 mg | ORAL_SOLUTION | ORAL | Status: DC | PRN
  Filled 2020-12-05: qty 10

## 2020-12-05 MED ORDER — MENTHOL 3 MG MT LOZG
1.0000 | LOZENGE | OROMUCOSAL | Status: DC | PRN
  Filled 2020-12-05: qty 9

## 2020-12-05 MED ORDER — FUROSEMIDE 20 MG PO TABS: 20.0000 mg | ORAL_TABLET | Freq: Every day | ORAL | Status: DC

## 2020-12-05 MED ORDER — BUPIVACAINE-EPINEPHRINE (PF) 0.25% -1:200000 IJ SOLN
INTRAMUSCULAR | Status: AC
  Filled 2020-12-05: qty 30

## 2020-12-05 MED ORDER — MIDAZOLAM HCL 2 MG/2ML IJ SOLN
INTRAMUSCULAR | Status: AC
  Filled 2020-12-05: qty 2

## 2020-12-05 MED ORDER — ONDANSETRON HCL 4 MG/2ML IJ SOLN: 4.0000 mg | Freq: Once | INTRAMUSCULAR | Status: DC | PRN

## 2020-12-05 MED ORDER — CEFAZOLIN SODIUM-DEXTROSE 2-4 GM/100ML-% IV SOLN
2.0000 g | INTRAVENOUS | Status: AC
  Administered 2020-12-05: 2 g via INTRAVENOUS

## 2020-12-05 MED ORDER — PHENYLEPHRINE HCL (PRESSORS) 10 MG/ML IV SOLN
INTRAVENOUS | Status: AC
  Filled 2020-12-05: qty 1

## 2020-12-05 MED ORDER — CHLORHEXIDINE GLUCONATE 0.12 % MT SOLN
OROMUCOSAL | Status: AC
  Administered 2020-12-05: 15 mL via OROMUCOSAL
  Filled 2020-12-05: qty 15

## 2020-12-05 MED ORDER — OXYCODONE HCL 5 MG PO TABS
5.0000 mg | ORAL_TABLET | ORAL | Status: DC | PRN
  Administered 2020-12-05: 5 mg via ORAL
  Administered 2020-12-05 – 2020-12-06 (×2): 10 mg via ORAL
  Administered 2020-12-06: 5 mg via ORAL
  Administered 2020-12-06: 10 mg via ORAL

## 2020-12-05 MED ORDER — FAMOTIDINE 20 MG PO TABS: 20.0000 mg | ORAL_TABLET | Freq: Once | ORAL | Status: AC

## 2020-12-05 MED ORDER — OXYCODONE HCL 5 MG PO TABS
10.0000 mg | ORAL_TABLET | ORAL | Status: DC | PRN
  Administered 2020-12-06: 10 mg via ORAL

## 2020-12-05 MED ORDER — CHLORHEXIDINE GLUCONATE 0.12 % MT SOLN: 15.0000 mL | Freq: Once | OROMUCOSAL | Status: AC

## 2020-12-05 MED ORDER — MAGNESIUM HYDROXIDE 400 MG/5ML PO SUSP: 30.0000 mL | Freq: Every day | ORAL | Status: DC | PRN

## 2020-12-05 MED ORDER — OXYCODONE HCL 5 MG PO TABS
ORAL_TABLET | ORAL | Status: AC
  Filled 2020-12-05: qty 2

## 2020-12-05 MED ORDER — RIVAROXABAN 20 MG PO TABS: 20.0000 mg | ORAL_TABLET | Freq: Every day | ORAL | Status: DC

## 2020-12-05 SURGICAL SUPPLY — 63 items
BLADE SAGITTAL AGGR TOOTH XLG (BLADE) ×2 IMPLANT
BNDG COHESIVE 6X5 TAN STRL LF (GAUZE/BANDAGES/DRESSINGS) ×6 IMPLANT
CANISTER SUCT 1200ML W/VALVE (MISCELLANEOUS) ×2 IMPLANT
CANISTER WOUND CARE 500ML ATS (WOUND CARE) ×2 IMPLANT
CHLORAPREP W/TINT 26 (MISCELLANEOUS) ×2 IMPLANT
COVER BACK TABLE REUSABLE LG (DRAPES) ×2 IMPLANT
COVER WAND RF STERILE (DRAPES) ×2 IMPLANT
DRAPE 3/4 80X56 (DRAPES) ×6 IMPLANT
DRAPE C-ARM XRAY 36X54 (DRAPES) ×2 IMPLANT
DRAPE INCISE IOBAN 66X60 STRL (DRAPES) IMPLANT
DRAPE POUCH INSTRU U-SHP 10X18 (DRAPES) ×2 IMPLANT
DRESSING SURGICEL FIBRLLR 1X2 (HEMOSTASIS) ×2 IMPLANT
DRSG MEPILEX SACRM 8.7X9.8 (GAUZE/BANDAGES/DRESSINGS) ×2 IMPLANT
DRSG OPSITE POSTOP 4X8 (GAUZE/BANDAGES/DRESSINGS) ×4 IMPLANT
DRSG SURGICEL FIBRILLAR 1X2 (HEMOSTASIS) ×4
ELECT BLADE 6.5 EXT (BLADE) ×2 IMPLANT
ELECT REM PT RETURN 9FT ADLT (ELECTROSURGICAL) ×2
ELECTRODE REM PT RTRN 9FT ADLT (ELECTROSURGICAL) ×1 IMPLANT
GLOVE BIOGEL PI IND STRL 9 (GLOVE) ×1 IMPLANT
GLOVE BIOGEL PI INDICATOR 9 (GLOVE) ×1
GLOVE SURG SYN 9.0  PF PI (GLOVE) ×2
GLOVE SURG SYN 9.0 PF PI (GLOVE) ×2 IMPLANT
GOWN SRG 2XL LVL 4 RGLN SLV (GOWNS) ×1 IMPLANT
GOWN STRL NON-REIN 2XL LVL4 (GOWNS) ×1
GOWN STRL REUS W/ TWL LRG LVL3 (GOWN DISPOSABLE) ×1 IMPLANT
GOWN STRL REUS W/TWL LRG LVL3 (GOWN DISPOSABLE) ×1
HEAD FEMORAL 28MM SZ S (Head) ×2 IMPLANT
HEMOVAC 400CC 10FR (MISCELLANEOUS) IMPLANT
HIP DBL LINER 54X28 (Liner) ×2 IMPLANT
HOLDER FOLEY CATH W/STRAP (MISCELLANEOUS) ×2 IMPLANT
HOOD PEEL AWAY FLYTE STAYCOOL (MISCELLANEOUS) ×2 IMPLANT
IRRIGATION SURGIPHOR STRL (IV SOLUTION) IMPLANT
KIT PREVENA INCISION MGT 13 (CANNISTER) ×2 IMPLANT
MANIFOLD NEPTUNE II (INSTRUMENTS) ×2 IMPLANT
MAT ABSORB  FLUID 56X50 GRAY (MISCELLANEOUS) ×1
MAT ABSORB FLUID 56X50 GRAY (MISCELLANEOUS) ×1 IMPLANT
NDL SAFETY ECLIPSE 18X1.5 (NEEDLE) ×1 IMPLANT
NEEDLE HYPO 18GX1.5 SHARP (NEEDLE) ×1
NEEDLE SPNL 20GX3.5 QUINCKE YW (NEEDLE) ×4 IMPLANT
NS IRRIG 1000ML POUR BTL (IV SOLUTION) ×2 IMPLANT
PACK HIP COMPR (MISCELLANEOUS) ×2 IMPLANT
SCALPEL PROTECTED #10 DISP (BLADE) ×4 IMPLANT
SHELL ACETABULAR SZ 54 DM (Shell) ×2 IMPLANT
SOL PREP PVP 2OZ (MISCELLANEOUS) ×2
SOLUTION PREP PVP 2OZ (MISCELLANEOUS) ×1 IMPLANT
SPONGE DRAIN TRACH 4X4 STRL 2S (GAUZE/BANDAGES/DRESSINGS) ×2 IMPLANT
STAPLER SKIN PROX 35W (STAPLE) ×2 IMPLANT
STEM MASTERLOC HIP LATERAL SZ8 (Stem) ×2 IMPLANT
STRAP SAFETY 5IN WIDE (MISCELLANEOUS) ×2 IMPLANT
SUT DVC 2 QUILL PDO  T11 36X36 (SUTURE) ×1
SUT DVC 2 QUILL PDO T11 36X36 (SUTURE) ×1 IMPLANT
SUT SILK 0 (SUTURE) ×1
SUT SILK 0 30XBRD TIE 6 (SUTURE) ×1 IMPLANT
SUT V-LOC 90 ABS DVC 3-0 CL (SUTURE) ×2 IMPLANT
SUT VIC AB 1 CT1 36 (SUTURE) ×2 IMPLANT
SYR 20ML LL LF (SYRINGE) ×2 IMPLANT
SYR 30ML LL (SYRINGE) ×2 IMPLANT
SYR 50ML LL SCALE MARK (SYRINGE) ×4 IMPLANT
SYR BULB IRRIG 60ML STRL (SYRINGE) ×2 IMPLANT
TAPE MICROFOAM 4IN (TAPE) ×2 IMPLANT
TOWEL OR 17X26 4PK STRL BLUE (TOWEL DISPOSABLE) ×2 IMPLANT
TRAY FOLEY MTR SLVR 16FR STAT (SET/KITS/TRAYS/PACK) ×2 IMPLANT
WAND WEREWOLF FASTSEAL 6.0 (MISCELLANEOUS) ×2 IMPLANT

## 2020-12-05 NOTE — Anesthesia Preprocedure Evaluation (Addendum)
Anesthesia Evaluation  Patient identified by MRN, date of birth, ID band Patient awake    Reviewed: Allergy & Precautions, H&P , NPO status , Patient's Chart, lab work & pertinent test results  History of Anesthesia Complications Negative for: history of anesthetic complications  Airway Mallampati: III  TM Distance: <3 FB     Dental  (+) Chipped   Pulmonary shortness of breath, neg sleep apnea, neg COPD, Current Smoker,  H/o PE   breath sounds clear to auscultation       Cardiovascular (-) angina+CHF (dilated cardiomyopathy. NICM)  (-) Past MI and (-) Cardiac Stents + dysrhythmias Atrial Fibrillation  Rate:Normal  Echo September 2021: 1. Left ventricular ejection fraction, by estimation, is 35 to 40%. The  left ventricle has moderately decreased function. The left ventricle  demonstrates global hypokinesis. Left ventricular diastolic parameters are  indeterminate.  2. Right ventricular systolic function is normal. The right ventricular  size is normal. Tricuspid regurgitation signal is inadequate for assessing  PA pressure.  3. Left atrial size was mildly dilated.  4. Mild mitral valve regurgitation   Neuro/Psych negative neurological ROS  negative psych ROS   GI/Hepatic negative GI ROS, Neg liver ROS,   Endo/Other  negative endocrine ROS  Renal/GU      Musculoskeletal   Abdominal   Peds  Hematology negative hematology ROS (+) Afib, anticoagulated. H/o DVT and PE   Anesthesia Other Findings Past Medical History: No date: A-fib (HCC) No date: CHF (congestive heart failure) (HCC)     Comment:  HFrEF No date: Chronic anticoagulation No date: DVT (deep venous thrombosis) (HCC)     Comment:  a. In setting of dislocated hip, status post IVC filter,              previously on Coumadin, noncompliant over the past 12               months No date: Hx of blood clots     Comment:  a. Patient denies any family  history of clotting               disorder, patient denies prior hypercoagulable workup No date: NICM (nonischemic cardiomyopathy) (HCC) No date: PE (pulmonary thromboembolism) (HCC) No date: Systolic murmur  Past Surgical History: No date: INSERTION OF VENA CAVA FILTER 03/18/2018: PERIPHERAL VASCULAR THROMBECTOMY; Right     Comment:  Procedure: PERIPHERAL VASCULAR THROMBECTOMY;  Surgeon:               Renford Dills, MD;  Location: [facility] INVASIVE CV LAB;               Service: Cardiovascular;  Laterality: Right;     Reproductive/Obstetrics negative OB ROS                           Anesthesia Physical Anesthesia Plan  ASA: III  Anesthesia Plan: Spinal   Post-op Pain Management:    Induction:   PONV Risk Score and Plan: Propofol infusion and Ondansetron  Airway Management Planned:   Additional Equipment:   Intra-op Plan:   Post-operative Plan:   Informed Consent: I have reviewed the patients History and Physical, chart, labs and discussed the procedure including the risks, benefits and alternatives for the proposed anesthesia with the patient or authorized representative who has indicated his/her understanding and acceptance.     Dental Advisory Given  Plan Discussed with: Anesthesiologist, CRNA and Surgeon  Anesthesia Plan Comments:  Anesthesia Quick Evaluation  

## 2020-12-05 NOTE — H&P (Signed)
Latanya Maudlin, PA - 12/04/2020 11:00 AM EST Formatting of this note is different from the original. Images from the original note were not included. Chief Complaint Chief Complaint  Patient presents with  . Hip Pain  H & P LEFT HIP   Reason for Visit Henry Dunn is a 45 y.o. who presents today for history and physical. He is to undergo a left total hip arthroplasty on 12/05/2020. He was last seen in clinic on 06/26/2020. Condition has gotten worse since that time but otherwise no change in his condition.  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 He states he thought he injured his left knee at that time, but when he got to the hospital, he was told he dislocated his left hip. He states this is the first time he has ever seen anyone for his left hip pain. He is unable to preform his daily activities due to pain, and is unable to put his socks on.   The patient states he has had blood clots in his left leg. He has a vena cava filter. He states 2 years ago, he had stents put in his vascular system to get better blood flow. He takes Xarelto.   The patient has congestive heart failure and atrial fibrillation, which he was told is from his blood clots. He sees Julien Nordmann, MD for his heart. He last saw him in 03/2020. He also sees Levora Dredge, MD. His ejection fraction is 20 to 25 percent. The patient has an appointment with Dr. Mariah Milling next month for an echocardiogram.   Past Medical History Past Medical History:  Diagnosis Date  . Atrial fibrillation (CMS-HCC)  . Chronic systolic heart failure (CMS-HCC)  . History of closed hip dislocation  . Hypertension   Past Surgical History Past Surgical History:  Procedure Laterality Date  . IVC filter placement Right 03/18/2018  [facility]   Past Family History History reviewed. No pertinent family history.  Medications Current  Outpatient Medications Ordered in Epic  Medication Sig Dispense Refill  . FUROsemide (LASIX) 20 MG tablet Takes 20mg  in the AM and an additional 20mg  in the evening if needed  . metoprolol succinate (TOPROL-XL) 100 MG XL tablet  . rivaroxaban (XARELTO) 20 mg tablet Take 1 tablet by mouth once daily  . spironolactone (ALDACTONE) 25 MG tablet Take 0.5 tablets by mouth once daily  . FUROsemide (LASIX) 40 MG tablet Take 1 tablet by mouth 2 (two) times daily (Patient not taking: Reported on 12/04/2020 )  . losartan (COZAAR) 25 MG tablet Take 1 tablet by mouth once daily  . metoprolol succinate (TOPROL-XL) 25 MG XL tablet Take 3 tablets by mouth once daily   No current Epic-ordered facility-administered medications on file.   Allergies No Known Allergies   Review of Systems A comprehensive 14 point ROS was performed, reviewed, and the pertinent orthopaedic findings are documented in the HPI.  Exam BP 140/80  Ht 172.7 cm (5\' 8" )  Wt 95.8 kg (211 lb 3.2 oz)  BMI 32.11 kg/m   General: Well-developed well-nourished male seen in no acute distress.   HEENT: Atraumatic,normocephalic. Pupils are equal and reactive to light. Oropharynx is clear with moist mucosa  Lungs: Clear to auscultation bilaterally   Cardiovascular: Regular rate and rhythm. Normal S1, S2. No murmurs. No appreciable gallops or rubs. Peripheral pulses are palpable.  Abdomen: Soft, non-tender, nondistended. Bowel sounds  present  Extremity: Left hip has -5 degrees abduction, 0 degrees internal rotation, 10 degrees external. Left knee has a 10 degree flexion contracture. Pain with log rolling. Palpable pulse.   Neurological:  The patient is alert and oriented Sensation to light touch appears to be intact and within normal limits Gross motor strength appeared to be equal to 5/5  Vascular :  Peripheral pulses felt to be palpable. Capillary refill appears to be intact and within normal limits  X-ray  AP pelvic and  lateral view of the left hip taken in clinical shows severe hip osteoarthritis. There is a large anterior inferior osteophyte off the pubis that is consistent with a dislocation and fracture. There is a large osteophyte off the lateral and posterior femoral head, again consistent with a history of trauma. There is no superior joint space. Bridging osteophytes inferiorly and medially.  Impression  1. Degenerative arthrosis left hip  Plan   1. Patient has discontinued his Xarelto as of Sunday 2. Patient is planning on staying overnight 3. Return to clinic as scheduled   This note was generated in part with voice recognition software and I apologize for any typographical errors that were not detected and corrected   Tera Partridge PA  Electronically signed by Latanya Maudlin, PA at 12/04/2020 11:28 AM EST  Back to top of Progress Notes  Recardo Evangelist, CMA - 12/04/2020 11:00 AM EST Formatting of this note might be different from the original. Review of Systems  Constitutional: Positive for activity change.  HENT: Negative.  Eyes: Negative.  Respiratory: Positive for shortness of breath.  Cardiovascular: Negative.  Gastrointestinal: Negative.  Endocrine: Negative.  Genitourinary: Negative.  Musculoskeletal: Positive for arthralgias, gait problem and myalgias.  Skin: Negative.  Allergic/Immunologic: Negative.  Hematological: Bruises/bleeds easily.  Psychiatric/Behavioral: Negative.     Electronically signed by Recardo Evangelist, CMA at 12/04/2020 11:28 AM EST    Reviewed  H+P. No changes noted.

## 2020-12-05 NOTE — Op Note (Signed)
12/05/2020  11:52 AM  PATIENT:  Henry Dunn  45 y.o. male  PRE-OPERATIVE DIAGNOSIS:  Post-traumatic osteoarthritis of left hip M16.52 Acute pain of left hip M25.552 Closed anterior dislocation of distal end of left femur, initial encounter S83.195A Chronic deep vein thrombosis DVT of proximal vein of left lower extremity I82.5Y2  POST-OPERATIVE DIAGNOSIS:  Post-traumatic osteoarthritis of left hip M16.52  PROCEDURE:  Procedure(s): TOTAL HIP ARTHROPLASTY ANTERIOR APPROACH (Left)  SURGEON: Leitha Schuller, MD  ASSISTANTS: None  ANESTHESIA:   spinal  EBL:  Total I/O In: 1900 [I.V.:1700; IV Piggyback:200] Out: 50 [Urine:50]  BLOOD ADMINISTERED:none  DRAINS: Incisional wound VAC   LOCAL MEDICATIONS USED:  MARCAINE    and OTHER Exparel  SPECIMEN:  Source of Specimen:  Left femoral head and acetabular spurs  DISPOSITION OF SPECIMEN:  PATHOLOGY  COUNTS:  YES  TOURNIQUET:  * No tourniquets in log *  IMPLANTS: Medacta Masterloc 8 lateralized plus stem, 54 mm M you have the best part of the wearing N95 pact DM cup with liner and ceramic S 28 mm head  DICTATION: .Dragon Dictation   The patient was brought to the operating room and after spinal anesthesia was obtained patient was placed on the operative table with the ipsilateral foot into the Medacta attachment, contralateral leg on a well-padded table. C-arm was brought in and preop template x-ray taken. After prepping and draping in usual sterile fashion appropriate patient identification and timeout procedures were completed. Anterior approach to the hip was obtained and centered over the greater trochanter and TFL muscle. The subcutaneous tissue was incised hemostasis being achieved by electrocautery. TFL fascia was incised and the muscle retracted laterally deep retractor placed. The lateral femoral circumflex vessels were identified and ligated. The anterior capsule was exposed and a capsulotomy performed. The neck was identified and  a femoral neck cut carried out with a saw. The head was removed without difficulty and showed sclerotic femoral head and acetabulum.  There were extensive spurs around the head which came off with the head and then with the osteotome inferior spurs from the acetabulum were also removed.  Reaming was carried out to 54 mm and a 54 mm cup trial gave appropriate tightness to the acetabular component a 54 DM cup was impacted into position. The leg was then externally rotated and ischiofemoral and pubofemoral releases carried out. The femur was sequentially broached to a size 8, size 8 lateralized with S head trials were placed and the final components chosen. The 8 lateralized plus stem was inserted along with a ceramic S 28 mm head and 54 mm liner. The hip was reduced and was stable the wound was thoroughly irrigated with fibrillar placed along the posterior capsule and medial neck. The deep fascia ws closed using a heavy Quill after infiltration of 30 cc of quarter percent Sensorcaine with epinephrine.3-0 V-loc to close the skin with skin staples.  Incisional wound VAC applied and patient was sent to recovery in stable condition.   PLAN OF CARE: Hope to discharge home today if he does well with therapy

## 2020-12-05 NOTE — Anesthesia Procedure Notes (Signed)
Spinal  Patient location during procedure: OR Start time: 12/05/2020 10:08 AM End time: 12/05/2020 10:16 AM Staffing Performed: resident/CRNA  Anesthesiologist: Karleen Hampshire, MD Resident/CRNA: Henrietta Hoover, CRNA Preanesthetic Checklist Completed: patient identified, IV checked, site marked, risks and benefits discussed, surgical consent, monitors and equipment checked, pre-op evaluation and timeout performed Spinal Block Patient position: sitting Prep: DuraPrep Patient monitoring: heart rate, cardiac monitor, continuous pulse ox and blood pressure Approach: midline Location: L3-4 Injection technique: single-shot Needle Needle type: Whitacre  Needle gauge: 22 G Needle length: 9 cm Assessment Sensory level: T4

## 2020-12-05 NOTE — Evaluation (Signed)
Physical Therapy Evaluation Patient Details Name: Henry Dunn MRN: [id number] DOB: 1976/11/24 Today's Date: 12/05/2020   History of Present Illness  Pt is a 45 yo male diagnosed with post-traumatic osteoarthritis of the left hip and is s/p elective left THA.  PMH includes: A-fib, smoker, SOB, covid-19 (per patient), CHF, DVT, chronic anticoagulation, s/p IVC filter, h/o blood clots, NICM, and PE.    Clinical Impression  Pt was pleasant and motivated to participate during the session.  Pt required min A with bed mobility tasks for LLE control and cuing for proper sequencing.  Pt was steady in sitting at the EOB during seated therex and was asymptomatic initially in standing while performing LLE standing marching but then began to c/o dizziness and returned to sitting.  Pt's BP taken in sitting at 62/48 and was returned to supine, nursing and MD aware.  Pt is expected to make good progress towards goals while in acute care and will benefit from HHPT services upon discharge to safely address deficits listed in patient problem list for decreased caregiver assistance and eventual return to PLOF.      Follow Up Recommendations Home health PT;Supervision for mobility/OOB    Equipment Recommendations  Rolling walker with 5" wheels;3in1 (PT)    Recommendations for Other Services       Precautions / Restrictions Precautions Precautions: Anterior Hip Precaution Booklet Issued: Yes (comment) Restrictions Weight Bearing Restrictions: Yes LLE Weight Bearing: Weight bearing as tolerated      Mobility  Bed Mobility Overal bed mobility: Needs Assistance Bed Mobility: Supine to Sit;Sit to Supine     Supine to sit: Min assist Sit to supine: Min assist   General bed mobility comments: Min A for LLE control with cues for sequencing    Transfers Overall transfer level: Needs assistance Equipment used: Rolling walker (2 wheeled) Transfers: Sit to/from Stand Sit to Stand: Min guard;From  elevated surface         General transfer comment: Mod verbal cues for sequencing with fair eccentric and concentric control  Ambulation/Gait             General Gait Details: Unsafe to attempt secondary to orthostatic hypotension in standing  Stairs            Wheelchair Mobility    Modified Rankin (Stroke Patients Only)       Balance Overall balance assessment: Needs assistance   Sitting balance-Leahy Scale: Good     Standing balance support: Bilateral upper extremity supported;During functional activity Standing balance-Leahy Scale: Good                               Pertinent Vitals/Pain Pain Assessment: 0-10 Pain Score: 1  Pain Descriptors / Indicators: Aching;Sore Pain Intervention(s): Premedicated before session;Monitored during session    Home Living Family/patient expects to be discharged to:: Private residence Living Arrangements: Spouse/significant other;Children Available Help at Discharge: Family;Available 24 hours/day Type of Home: House Home Access: Stairs to enter Entrance Stairs-Rails: None Entrance Stairs-Number of Steps: 2 Home Layout: One level Home Equipment: None      Prior Function Level of Independence: Independent         Comments: Ind amb community distances without an AD, one fall in last 6 months secondary to complications of covid-19, Ind with ADLs     Hand Dominance        Extremity/Trunk Assessment   Upper Extremity Assessment Upper Extremity Assessment: Overall WFL for tasks assessed  Lower Extremity Assessment Lower Extremity Assessment: Generalized weakness;LLE deficits/detail LLE Deficits / Details: BLE ankle AROM, strength, and sensation to light touch grossly intact; LLE hip flex strength <3/5 LLE: Unable to fully assess due to pain LLE Sensation: WNL       Communication   Communication: No difficulties  Cognition Arousal/Alertness: Awake/alert Behavior During Therapy: WFL for  tasks assessed/performed Overall Cognitive Status: Within Functional Limits for tasks assessed                                        General Comments      Exercises Total Joint Exercises Ankle Circles/Pumps: 10 reps;Strengthening;Both Hip ABduction/ADduction: AAROM;Left;5 reps Straight Leg Raises: AAROM;Left;5 reps Long Arc Quad: Strengthening;AROM;Both;10 reps;15 reps Knee Flexion: AROM;Strengthening;Both;15 reps;10 reps Other Exercises: Standing LLE marching x 5 Other Exercises: HEP education and review per handout Other Exercises: Anterior hip precaution education/review per handout   Assessment/Plan    PT Assessment Patient needs continued PT services  PT Problem List Decreased strength;Decreased activity tolerance;Decreased balance;Decreased mobility;Decreased knowledge of use of DME;Decreased knowledge of precautions;Pain       PT Treatment Interventions DME instruction;Gait training;Stair training;Functional mobility training;Therapeutic activities;Therapeutic exercise;Balance training;Patient/family education    PT Goals (Current goals can be found in the Care Plan section)  Acute Rehab PT Goals Patient Stated Goal: "To get back to normal and to be able to do more" PT Goal Formulation: With patient Time For Goal Achievement: 12/18/20 Potential to Achieve Goals: Good    Frequency BID   Barriers to discharge        Co-evaluation               AM-PAC PT "6 Clicks" Mobility  Outcome Measure Help needed turning from your back to your side while in a flat bed without using bedrails?: A Little Help needed moving from lying on your back to sitting on the side of a flat bed without using bedrails?: A Little Help needed moving to and from a bed to a chair (including a wheelchair)?: A Little Help needed standing up from a chair using your arms (e.g., wheelchair or bedside chair)?: A Little Help needed to walk in hospital room?: A Lot Help needed  climbing 3-5 steps with a railing? : A Lot 6 Click Score: 16    End of Session Equipment Utilized During Treatment: Gait belt Activity Tolerance: Treatment limited secondary to medical complications (Comment) (limited by orthostatic hypotension) Patient left: in bed;with family/visitor present;with nursing/sitter in room;with call bell/phone within reach Nurse Communication: Mobility status;Other (comment) (Symptomatic orthostatic hypotension in standing) PT Visit Diagnosis: Muscle weakness (generalized) (M62.81);Other abnormalities of gait and mobility (R26.89);Pain Pain - Right/Left: Left Pain - part of body: Hip    Time: 4174-0814 PT Time Calculation (min) (ACUTE ONLY): 34 min   Charges:   PT Evaluation $PT Eval Moderate Complexity: 1 Mod PT Treatments $Therapeutic Exercise: 8-22 mins        D. Elly Modena PT, DPT 12/05/20, 4:29 PM

## 2020-12-05 NOTE — Transfer of Care (Signed)
Immediate Anesthesia Transfer of Care Note  Patient: Henry Dunn  Procedure(s) Performed: TOTAL HIP ARTHROPLASTY ANTERIOR APPROACH (Left Hip)  Patient Location: PACU  Anesthesia Type:Spinal  Level of Consciousness: awake, drowsy and patient cooperative  Airway & Oxygen Therapy: Patient Spontanous Breathing  Post-op Assessment: Report given to RN and Post -op Vital signs reviewed and stable  Post vital signs: Reviewed and stable  Last Vitals:  Vitals Value Taken Time  BP 82/55 12/05/20 1157  Temp 36.1 C 12/05/20 1155  Pulse 78 12/05/20 1158  Resp 16 12/05/20 1158  SpO2 99 % 12/05/20 1158  Vitals shown include unvalidated device data.  Last Pain:  Vitals:   12/05/20 0905  PainSc: 0-No pain         Complications: No complications documented.

## 2020-12-06 ENCOUNTER — Encounter: Payer: Self-pay | Admitting: Orthopedic Surgery

## 2020-12-06 MED ORDER — TRAMADOL HCL 50 MG PO TABS
ORAL_TABLET | ORAL | Status: AC
  Filled 2020-12-06: qty 1

## 2020-12-06 MED ORDER — OXYCODONE HCL 5 MG PO TABS
ORAL_TABLET | ORAL | Status: AC
  Filled 2020-12-06: qty 2

## 2020-12-06 MED ORDER — TRAMADOL HCL 50 MG PO TABS
ORAL_TABLET | ORAL | Status: AC
  Administered 2020-12-06: 50 mg via ORAL
  Filled 2020-12-06: qty 1

## 2020-12-06 MED ORDER — OXYCODONE HCL 5 MG PO TABS
ORAL_TABLET | ORAL | Status: AC
  Filled 2020-12-06: qty 1

## 2020-12-06 MED ORDER — METHOCARBAMOL 500 MG PO TABS
ORAL_TABLET | ORAL | Status: AC
  Filled 2020-12-06: qty 1

## 2020-12-06 NOTE — TOC Initial Note (Addendum)
Transition of Care [facility]) - Initial/Assessment Note    Patient Details  Name: Henry Dunn MRN: [id number] Date of Birth: 05/14/1976  Transition of Care [facility]) CM/SW Contact:    Kistler Cellar, RN Phone Number: 12/06/2020, 3:23 PM  Clinical Narrative:                 Spoke to patient and patient was confused stating that doctor told him he was staying overnight to allow more time to work with therapy.  Patient does not want to decide on SNF or HHC until he works with therapy again. Patient states he lives with his girlfriend and has 2 small steps to enter into his home. Is hopeful for home with home health care but understands possibility of SNF.   RN CM confirmed with PT and surgeon patient would be staying overnight and will decide on disposition tomorrow after therapy.   Expected Discharge Plan: Home w Home Health Services     Patient Goals and CMS Choice Patient states their goals for this hospitalization and ongoing recovery are:: Wants to return home if possible CMS Medicare.gov Compare Post Acute Care list provided to:: Patient Choice offered to / list presented to : Patient  Expected Discharge Plan and Services Expected Discharge Plan: Home w Home Health Services       Living arrangements for the past 2 months: Single Family Home Expected Discharge Date: 12/05/20                                    Prior Living Arrangements/Services Living arrangements for the past 2 months: Single Family Home Lives with:: Significant Other Patient language and need for interpreter reviewed:: Yes Do you feel safe going back to the place where you live?: Yes      Need for Family Participation in Patient Care: Yes (Comment) Care giver support system in place?: Yes (comment)   Criminal Activity/Legal Involvement Pertinent to Current Situation/Hospitalization: No - Comment as needed  Activities of Daily Living Home Assistive Devices/Equipment: None ADL Screening (condition at time  of admission) Patient's cognitive ability adequate to safely complete daily activities?: Yes Is the patient deaf or have difficulty hearing?: No Does the patient have difficulty seeing, even when wearing glasses/contacts?: No Does the patient have difficulty concentrating, remembering, or making decisions?: No Patient able to express need for assistance with ADLs?: Yes Does the patient have difficulty dressing or bathing?: No Independently performs ADLs?: Yes (appropriate for developmental age) Does the patient have difficulty walking or climbing stairs?: Yes Weakness of Legs: None Weakness of Arms/Hands: None  Permission Sought/Granted Permission sought to share information with : Facility Industrial/product designer granted to share information with : Yes, Verbal Permission Granted              Emotional Assessment Appearance:: Appears stated age Attitude/Demeanor/Rapport: Engaged Affect (typically observed): Accepting Orientation: : Oriented to Self,Oriented to Place,Oriented to  Time,Oriented to Situation Alcohol / Substance Use: Tobacco Use Psych Involvement: No (comment)  Admission diagnosis:  S/P hip replacement [Z96.649] Patient Active Problem List   Diagnosis Date Noted  . S/P hip replacement 12/05/2020  . Persistent atrial fibrillation (HCC) 04/09/2019  . Acute exacerbation of CHF (congestive heart failure) (HCC) 02/21/2019  . Lymphedema 11/07/2018  . 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 Leg pain, left 06/21/2018  . Cellulitis and abscess of leg   . Typical atrial flutter (HCC) 02/18/2017  . Chronic deep  vein thrombosis (DVT) (HCC) 02/18/2017  . Smoker 02/18/2017  . DVT (deep venous thrombosis) (HCC) 02/18/2017  . Shortness of breath 02/17/2017   PCP:  [facility]:   [facility] [address] [place], [place] - [address] [facility] [address] [address] [place] [address] Phone: [phone] Fax:  [phone]  [id number] [place], [place] - [address] AT [facility] [address] [place] [address] [place] [address] Phone: [phone] Fax: [phone]     Social Determinants of Health (SDOH) Interventions    Readmission Risk Interventions No flowsheet data found.

## 2020-12-06 NOTE — Progress Notes (Addendum)
   Subjective: 1 Day Post-Op Procedure(s) (LRB): TOTAL HIP ARTHROPLASTY ANTERIOR APPROACH (Left) Patient reports pain as 4 on 0-10 scale.   Patient is well, and has had no acute complaints or problems Denies any CP, SOB, ABD pain. We will continue therapy today.  Plan is to go Home after hospital stay.  Objective: Vital signs in last 24 hours: Temp:  [96.8 F (36 C)-98.3 F (36.8 C)] 98.3 F (36.8 C) (01/07 0400) Pulse Rate:  [46-92] 74 (01/07 0400) Resp:  [14-20] 16 (01/07 0400) BP: (82-123)/(52-100) 104/58 (01/07 0400) SpO2:  [98 %-100 %] 98 % (01/07 0400) Weight:  [96 kg] 96 kg (01/06 0905)  Intake/Output from previous day: 01/06 0701 - 01/07 0700 In: 2100 [I.V.:1900; IV Piggyback:200] Out: 1300 [Urine:1150; Blood:150] Intake/Output this shift: No intake/output data recorded.  No results for input(s): HGB in the last 72 hours. No results for input(s): WBC, RBC, HCT, PLT in the last 72 hours. No results for input(s): NA, K, CL, CO2, BUN, CREATININE, GLUCOSE, CALCIUM in the last 72 hours. No results for input(s): LABPT, INR in the last 72 hours.  EXAM General - Patient is Alert, Appropriate and Oriented Extremity - Neurovascular intact Sensation intact distally Intact pulses distally Dorsiflexion/Plantar flexion intact No cellulitis present Compartment soft Dressing - dressing C/D/I and no drainage, prevena intact with out drainage Motor Function - intact, moving foot and toes well on exam.   Past Medical History:  Diagnosis Date  . A-fib (HCC)   . CHF (congestive heart failure) (HCC)    HFrEF  . Chronic anticoagulation   . DVT (deep venous thrombosis) (HCC)    a. In setting of dislocated hip, status post IVC filter, previously on Coumadin, noncompliant over the past 12 months  . Hx of blood clots    a. Patient denies any family history of clotting disorder, patient denies prior hypercoagulable workup  . NICM (nonischemic cardiomyopathy) (HCC)   . PE  (pulmonary thromboembolism) (HCC)   . Systolic murmur     Assessment/Plan:   1 Day Post-Op Procedure(s) (LRB): TOTAL HIP ARTHROPLASTY ANTERIOR APPROACH (Left) Active Problems:   S/P hip replacement  Estimated body mass index is 32.18 kg/m as calculated from the following:   Height as of this encounter: 5\' 8"  (1.727 m).   Weight as of this encounter: 96 kg. Advance diet Up with therapy  Work on BM Pain well controlled VSS CM to assist with discharge to home with HHPT   DVT Prophylaxis - TED hose and SCDS, Xarelto Weight-Bearing as tolerated to left leg   T. , PA-C [facility] 12/06/2020, 8:07 AM   Has been unsuccessful with physical therapy today.  We will continue another night in the hospital and hope that tomorrow he has better leg strength to be able to go home.  If he does not he may require skilled care.

## 2020-12-06 NOTE — Anesthesia Postprocedure Evaluation (Signed)
Anesthesia Post Note  Patient: Kairos Panetta  Procedure(s) Performed: TOTAL HIP ARTHROPLASTY ANTERIOR APPROACH (Left Hip)  Patient location during evaluation: Other (Pre-Op 17) Anesthesia Type: Spinal Level of consciousness: oriented and awake and alert Pain management: pain level controlled Vital Signs Assessment: post-procedure vital signs reviewed and stable Respiratory status: spontaneous breathing and respiratory function stable Cardiovascular status: blood pressure returned to baseline and stable Postop Assessment: no headache, no backache, no apparent nausea or vomiting and patient able to bend at knees Anesthetic complications: no   No complications documented.   Last Vitals:  Vitals:   12/06/20 0016 12/06/20 0400  BP: 103/71 (!) 104/58  Pulse: 82 74  Resp: 15 16  Temp: 36.6 C 36.8 C  SpO2: 98% 98%    Last Pain:  Vitals:   12/06/20 0503  TempSrc:   PainSc: Asleep                 Jeanine Luz

## 2020-12-06 NOTE — Evaluation (Addendum)
Occupational Therapy Evaluation Patient Details Name: Henry Dunn MRN: [id number] DOB: [birth year] Today's Date: 12/06/2020    History of Present Illness Pt is a 45 yo male diagnosed with post-traumatic osteoarthritis of the left hip and is s/p elective left THA.  PMH includes: A-fib, smoker, SOB, covid-19 (per patient), CHF, DVT, chronic anticoagulation, s/p IVC filter, h/o blood clots, NICM, and PE.   Clinical Impression   Pt seen for OT evaluation this date, POD #1 from above-named sx. Pt reports being INDEP at baseline, but will have consistent help at home from his significant other until Monday, 12/09/20, when she returns to work. Pt currently limited by decreased L hip flexion post-op'ly which impacts his ability to weight shift/step once he is up to standing with RW and impacts his ability to perform LB ADLs, even with adaptive equipment as he achieves very little lift-off from the group when attempting to elevate L foot. Pt with c/o 5/10 pain in L LE and sensation is in tact. Pt currently requires MOD A for LB dressing with AE, CGA for ADL transfers with RW, and demos Good static standing balance and Fair dynamic standing balance with B UE support. Unable to advance much with fxl mobility this date d/t limited ability to clear L food/swing through. Pt with limited tolerance for straightening L LE and is noted to keep knee/hip mildly flexed in standing, citing "tightness". Pt is noted to have a small/medium knot-like palpable area to anterior/lateral quadricep (not red or hot), just below his hip incision/dressing cite. MD and RN notified. OT will continue to follow acutely and currently anticipate pt will require SNF upon d/c from acute setting to improve safety with ADLs/ADL mobility to pt's highest attainable level.     Follow Up Recommendations  SNF;Supervision/Assistance - 24 hour    Equipment Recommendations  3 in 1 bedside commode;Tub/shower seat;Other (comment) (2ww)    Recommendations  for Other Services       Precautions / Restrictions Precautions Precautions: Anterior Hip Precaution Booklet Issued: Yes (comment) Restrictions Weight Bearing Restrictions: Yes LLE Weight Bearing: Weight bearing as tolerated      Mobility Bed Mobility               General bed mobility comments: Pt up to recliner pre/post session    Transfers Overall transfer level: Needs assistance Equipment used: Rolling walker (2 wheeled) Transfers: Sit to/from Stand Sit to Stand: Min guard;From elevated surface         General transfer comment: extended time required, pt with good control, demos slightly flexed hips/knee of L LE when in static standing, limited ability to clear L foot from ground with attempts to weight shift.    Balance Overall balance assessment: Needs assistance   Sitting balance-Leahy Scale: Good     Standing balance support: Bilateral upper extremity supported Standing balance-Leahy Scale: Good Standing balance comment: requires UE Support on RW, no external support required and pt demos good control for static stand. However, unable to weight shift without physical assist. Demos F balance for more dynamic tasks/fxl mobility                           ADL either performed or assessed with clinical judgement   ADL  General ADL Comments: Pt requires MOD A For seated LB ADLs, SETUP for seated UB ADLs, CGA for ADL Transfers with RW. Increased time.     Vision Patient Visual Report: No change from baseline       Perception     Praxis      Pertinent Vitals/Pain Pain Assessment: 0-10 Pain Score: 5  Pain Location: L hip/thigh Pain Descriptors / Indicators: Operative site guarding;Sore;Tender Pain Intervention(s): Limited activity within patient's tolerance;Monitored during session;Ice applied     Hand Dominance     Extremity/Trunk Assessment Upper Extremity Assessment Upper  Extremity Assessment: Overall WFL for tasks assessed   Lower Extremity Assessment Lower Extremity Assessment: Defer to PT evaluation;Generalized weakness;LLE deficits/detail LLE Deficits / Details: decreased hip flexion noted LLE: Unable to fully assess due to pain LLE Sensation: WNL   Cervical / Trunk Assessment Cervical / Trunk Assessment: Normal   Communication Communication Communication: No difficulties   Cognition Arousal/Alertness: Awake/alert Behavior During Therapy: WFL for tasks assessed/performed Overall Cognitive Status: Within Functional Limits for tasks assessed                                 General Comments: pt is A and O x 4 and extremely motivated.   General Comments  Noted knot-like area to the skin just below the anterior incision/dressing cite to L LE that was not red or hot, but was palpable.    Exercises Other Exercises Other Exercises: OT facilitaets ed re: role of OT in acute setting/post-op'ly, use of AE for LB ADLs, importance of mobilizing to optimize circulation, stretching within parameters of anterior hip precautions   Shoulder Instructions      Home Living Family/patient expects to be discharged to:: Private residence Living Arrangements: Spouse/significant other Available Help at Discharge: Family;Available PRN/intermittently (only available 24/7 until Monday and then his significant other must return to work) Type of Home: House Home Access: Stairs to enter [employer] of Steps: 2 Entrance Stairs-Rails: None Home Layout: One level     Bathroom Shower/Tub: Chief Strategy Officer: Standard     Home Equipment: None          Prior Functioning/Environment Level of Independence: Independent        Comments: Ind amb community distances without an AD, one fall in last 6 months secondary to complications of covid-19, Ind with ADLs        OT Problem List: Decreased range of motion;Decreased  activity tolerance;Impaired balance (sitting and/or standing);Decreased knowledge of use of DME or AE;Decreased knowledge of precautions;Pain      OT Treatment/Interventions: Self-care/ADL training;DME and/or AE instruction;Therapeutic activities;Balance training;Therapeutic exercise;Energy conservation;Patient/family education    OT Goals(Current goals can be found in the care plan section) Acute Rehab OT Goals Patient Stated Goal: to be able to move OT Goal Formulation: With patient Time For Goal Achievement: 12/20/20 Potential to Achieve Goals: Good ADL Goals Pt Will Perform Lower Body Dressing: with supervision;with adaptive equipment;sit to/from stand Pt Will Transfer to Toilet: with supervision;ambulating;bedside commode (with RW to [facility] ~10' away to increase tolerance and foot clearance for safe completion of functional HH distances/thresholds.) Pt/caregiver will Perform Home Exercise Program: Increased strength;Both right and left upper extremity;With Supervision  OT Frequency: Min 1X/week   Barriers to D/C: Decreased caregiver support          Co-evaluation              AM-PAC OT "6  Clicks" Daily Activity     Outcome Measure Help from another person eating meals?: None Help from another person taking care of personal grooming?: None Help from another person toileting, which includes using toliet, bedpan, or urinal?: A Little Help from another person bathing (including washing, rinsing, drying)?: A Lot Help from another person to put on and taking off regular upper body clothing?: A Little Help from another person to put on and taking off regular lower body clothing?: A Lot 6 Click Score: 18   End of Session Equipment Utilized During Treatment: Gait belt;Rolling walker Nurse Communication: Mobility status  Activity Tolerance: Patient tolerated treatment well Patient left: in chair;with call bell/phone within reach  OT Visit Diagnosis: Unsteadiness on feet  (R26.81);Other abnormalities of gait and mobility (R26.89)                Time: 3846-6599 OT Time Calculation (min): 38 min Charges:  OT General Charges $OT Visit: 1 Visit OT Evaluation $OT Eval Moderate Complexity: 1 Mod OT Treatments $Self Care/Home Management : 8-22 mins $Therapeutic Activity: 8-22 mins  Rejeana Brock, MS, OTR/L ascom [phone] 12/06/20, 2:11 PM

## 2020-12-06 NOTE — Progress Notes (Signed)
Physical Therapy Treatment Patient Details Name: Henry Dunn MRN: [id number] DOB: [birth year] Today's Date: 12/06/2020    History of Present Illness Pt is a 45 yo male diagnosed with post-traumatic osteoarthritis of the left hip and is s/p elective left THA.  PMH includes: A-fib, smoker, SOB, covid-19 (per patient), CHF, DVT, chronic anticoagulation, s/p IVC filter, h/o blood clots, NICM, and PE.    PT Comments    Pt was long sitting in bed upon arriving and eager to participate. Highly motivated throughout session. He is A and O x 4. Reports 4/10 pain. Yesterday pt struggled with orthostatic hypotension concerns that he did not have this session. No report of dizziness with BP 107/74 in standing. Pt did struggle with LLE progress. He was unable to progress LLE in standing even with max effort and attempts. He required mod assist to exit bed with use of bed rails and HOB elevated. Pt stood ~ 4 x EOB with CGA from slightly elevated bed height. MD notified of concern of pt's inability to progress LLE to taking steps. PT recs changed to DC to SNF due to safety concerns. Pt has 24 hour assistance at home for only two days and then will be home I'ly during the week. Acute PT will continue to follow and will return later this afternoon to retrial OOB/ambulation. Pt does have cardiac history with blood clots in the past. Will closely monitor going forward. Highly recommend DC to SNF to address deficits while assisting pt to PLOF.    Follow Up Recommendations  SNF;Supervision/Assistance - 24 hour     Equipment Recommendations  Rolling walker with 5" wheels;3in1 (PT)    Recommendations for Other Services       Precautions / Restrictions Precautions Precautions: Anterior Hip Precaution Booklet Issued: Yes (comment) Restrictions Weight Bearing Restrictions: Yes LLE Weight Bearing: Weight bearing as tolerated    Mobility  Bed Mobility Overal bed mobility: Needs Assistance Bed Mobility: Supine to  Sit     Supine to sit: Mod assist;HOB elevated Sit to supine:  (pt was sitting EOB at conclusion of session)   General bed mobility comments: Pt was unable to lift LLE and progress to EOB without mod assist + use of bedrails+increased time  Transfers Overall transfer level: Needs assistance Equipment used: Rolling walker (2 wheeled) Transfers: Sit to/from Stand Sit to Stand: Min guard;From elevated surface         General transfer comment: performed STS 4 x EOB. BP stable upon standing 107/74  Ambulation/Gait Ambulation/Gait assistance: Min guard Gait Distance (Feet): 3 Feet Assistive device: Rolling walker (2 wheeled)   Gait velocity: decrease   General Gait Details: Pt was unable to ambulate even with max effort. Pain did not limit. Pt gives great effort and is motivated throughout but limited by strength. pt c/o burning in LLE. palpable tightness/knot in LLE. MD notified       Balance Overall balance assessment: Needs assistance   Sitting balance-Leahy Scale: Good Sitting balance - Comments: no LOB sitting EOB with feet support only   Standing balance support: Bilateral upper extremity supported;During functional activity Standing balance-Leahy Scale: Good Standing balance comment: no LOB however reliant on RW and BUE support to maintain standing.         Cognition Arousal/Alertness: Awake/alert Behavior During Therapy: WFL for tasks assessed/performed Overall Cognitive Status: Within Functional Limits for tasks assessed      General Comments: pt is A and O x 4 and extremely motivated.  Pertinent Vitals/Pain Pain Assessment: 0-10 Pain Score: 4  Pain Location: L hip/thigh Pain Descriptors / Indicators: Burning;Aching;Sore Pain Intervention(s): Limited activity within patient's tolerance;Monitored during session;Repositioned           PT Goals (current goals can now be found in the care plan section) Acute Rehab PT Goals Patient Stated  Goal: to be able to move Progress towards PT goals: Not progressing toward goals - comment (pty unable to progress LE in standing)    Frequency    BID      PT Plan Discharge plan needs to be updated       AM-PAC PT "6 Clicks" Mobility   Outcome Measure  Help needed turning from your back to your side while in a flat bed without using bedrails?: A Little Help needed moving from lying on your back to sitting on the side of a flat bed without using bedrails?: A Lot Help needed moving to and from a bed to a chair (including a wheelchair)?: A Lot Help needed standing up from a chair using your arms (e.g., wheelchair or bedside chair)?: A Lot Help needed to walk in hospital room?: A Lot Help needed climbing 3-5 steps with a railing? : Total 6 Click Score: 12    End of Session Equipment Utilized During Treatment: Gait belt Activity Tolerance: Patient tolerated treatment well;Other (comment) Patient left: Other (comment) (sitting EOB with RN aware) Nurse Communication: Mobility status PT Visit Diagnosis: Muscle weakness (generalized) (M62.81);Other abnormalities of gait and mobility (R26.89);Pain Pain - Right/Left: Left Pain - part of body: Hip     Time: 1594-5859 PT Time Calculation (min) (ACUTE ONLY): 30 min  Charges:  $Therapeutic Activity: 23-37 mins                     Jetta Lout PTA 12/06/20, 10:39 AM

## 2020-12-06 NOTE — Progress Notes (Signed)
PT Cancellation Note  Patient Details Name: Henry Dunn MRN: [id number] DOB: 09-25-1976   Cancelled Treatment:    Reason Eval/Treat Not Completed: Other (comment). The patient resting in bed, in no acute distress. Politely declined PT at this time stated he was nauseous and was having a lot of pain/difficulty moving his leg around at this time. Per RN pt has had PT, OT and has sat up in the chair as well as transferred to the [facility] today. PT to re-attempt tomorrow, pt educated about continued exercises and verbalized understanding.  Olga Coaster PT, DPT 3:53 PM,12/06/20

## 2020-12-07 DIAGNOSIS — Z96642 Presence of left artificial hip joint: Secondary | ICD-10-CM

## 2020-12-07 DIAGNOSIS — Z79899 Other long term (current) drug therapy: Secondary | ICD-10-CM | POA: Diagnosis not present

## 2020-12-07 DIAGNOSIS — Z7901 Long term (current) use of anticoagulants: Secondary | ICD-10-CM | POA: Diagnosis not present

## 2020-12-07 DIAGNOSIS — S73005S Unspecified dislocation of left hip, sequela: Secondary | ICD-10-CM | POA: Diagnosis not present

## 2020-12-07 DIAGNOSIS — M1652 Unilateral post-traumatic osteoarthritis, left hip: Secondary | ICD-10-CM | POA: Diagnosis present

## 2020-12-07 DIAGNOSIS — R269 Unspecified abnormalities of gait and mobility: Secondary | ICD-10-CM | POA: Diagnosis present

## 2020-12-07 DIAGNOSIS — I825Y2 Chronic embolism and thrombosis of unspecified deep veins of left proximal lower extremity: Secondary | ICD-10-CM | POA: Diagnosis present

## 2020-12-07 DIAGNOSIS — I5022 Chronic systolic (congestive) heart failure: Secondary | ICD-10-CM | POA: Diagnosis present

## 2020-12-07 DIAGNOSIS — Z95828 Presence of other vascular implants and grafts: Secondary | ICD-10-CM | POA: Diagnosis not present

## 2020-12-07 DIAGNOSIS — W14XXXS Fall from tree, sequela: Secondary | ICD-10-CM | POA: Diagnosis not present

## 2020-12-07 DIAGNOSIS — I4891 Unspecified atrial fibrillation: Secondary | ICD-10-CM | POA: Diagnosis present

## 2020-12-07 DIAGNOSIS — F17210 Nicotine dependence, cigarettes, uncomplicated: Secondary | ICD-10-CM | POA: Diagnosis present

## 2020-12-07 DIAGNOSIS — I428 Other cardiomyopathies: Secondary | ICD-10-CM | POA: Diagnosis present

## 2020-12-07 DIAGNOSIS — I110 Hypertensive heart disease with heart failure: Secondary | ICD-10-CM | POA: Diagnosis present

## 2020-12-07 DIAGNOSIS — Z86711 Personal history of pulmonary embolism: Secondary | ICD-10-CM | POA: Diagnosis not present

## 2020-12-07 MED ORDER — METHOCARBAMOL 500 MG PO TABS: 500.0000 mg | ORAL_TABLET | Freq: Four times a day (QID) | ORAL | 0 refills | Status: DC | PRN

## 2020-12-07 MED ORDER — TRAMADOL HCL 50 MG PO TABS
ORAL_TABLET | ORAL | Status: AC
  Filled 2020-12-07: qty 1

## 2020-12-07 MED ORDER — TRAMADOL HCL 50 MG PO TABS
ORAL_TABLET | ORAL | Status: AC
  Administered 2020-12-07: 50 mg via ORAL
  Filled 2020-12-07: qty 1

## 2020-12-07 MED ORDER — OXYCODONE HCL 5 MG PO TABS
ORAL_TABLET | ORAL | Status: AC
  Administered 2020-12-07: 10 mg via ORAL
  Filled 2020-12-07: qty 2

## 2020-12-07 MED ORDER — MAGNESIUM HYDROXIDE 400 MG/5ML PO SUSP
30.0000 mL | Freq: Once | ORAL | Status: AC
  Administered 2020-12-07: 30 mL via ORAL

## 2020-12-07 MED ORDER — DOCUSATE SODIUM 100 MG PO CAPS: 100.0000 mg | ORAL_CAPSULE | Freq: Two times a day (BID) | ORAL | 0 refills | Status: DC

## 2020-12-07 MED ORDER — METHOCARBAMOL 500 MG PO TABS
ORAL_TABLET | ORAL | Status: AC
  Filled 2020-12-07: qty 1

## 2020-12-07 MED ORDER — OXYCODONE HCL 5 MG PO TABS
5.0000 mg | ORAL_TABLET | ORAL | 0 refills | Status: DC | PRN
Start: 2020-12-07 — End: 2021-04-29

## 2020-12-07 MED ORDER — TRAMADOL HCL 50 MG PO TABS: 50.0000 mg | ORAL_TABLET | Freq: Four times a day (QID) | ORAL | 0 refills | Status: DC

## 2020-12-07 NOTE — Progress Notes (Signed)
PT  Note  Patient Details Name: Henry Dunn MRN: [id number] DOB: 1976/06/11   Cancelled Treatment:     Pt was supine in bed with significant other in room. He requested not to get OOB however has been up ambulating prior to arriving. Therapist reviewed stair navigation with girlfriend and reviewed all safety concerns, positioning,car transfers and ADL with pt. He is cleared from PT standpoint for safe DC home.    [name] 12/07/2020, 2:07 PM

## 2020-12-07 NOTE — Progress Notes (Signed)
   Subjective: 2 Days Post-Op Procedure(s) (LRB): TOTAL HIP ARTHROPLASTY ANTERIOR APPROACH (Left) Patient reports pain mild, improved from yesterday  Patient is having some swelling along anterior thigh to knee Denies any CP, SOB, ABD pain. We will continue therapy today.  Plan is to go Home after hospital stay.  Objective: Vital signs in last 24 hours: Temp:  [98.3 F (36.8 C)-99.5 F (37.5 C)] 98.7 F (37.1 C) (01/08 0800) Pulse Rate:  [82-104] 90 (01/08 0800) Resp:  [16-17] 17 (01/08 0800) BP: (103-120)/(67-86) 103/70 (01/08 0800) SpO2:  [96 %-99 %] 98 % (01/08 0800)  Intake/Output from previous day: No intake/output data recorded. Intake/Output this shift: Total I/O In: -  Out: 1000 [Urine:1000]  No results for input(s): HGB in the last 72 hours. No results for input(s): WBC, RBC, HCT, PLT in the last 72 hours. No results for input(s): NA, K, CL, CO2, BUN, CREATININE, GLUCOSE, CALCIUM in the last 72 hours. No results for input(s): LABPT, INR in the last 72 hours.  EXAM General - Patient is Alert, Appropriate and Oriented Extremity - Neurovascular intact Sensation intact distally Intact pulses distally Dorsiflexion/Plantar flexion intact No cellulitis present Compartment soft  Normal postoperative swelling left anterior thigh. No lower leg swelling or edema. Dressing - dressing C/D/I and no drainage, prevena intact with minimal drainage Motor Function - intact, moving foot and toes well on exam.   Past Medical History:  Diagnosis Date  . A-fib (HCC)   . CHF (congestive heart failure) (HCC)    HFrEF  . Chronic anticoagulation   . DVT (deep venous thrombosis) (HCC)    a. In setting of dislocated hip, status post IVC filter, previously on Coumadin, noncompliant over the past 12 months  . Hx of blood clots    a. Patient denies any family history of clotting disorder, patient denies prior hypercoagulable workup  . NICM (nonischemic cardiomyopathy) (HCC)   . PE  (pulmonary thromboembolism) (HCC)   . Systolic murmur     Assessment/Plan:   2 Days Post-Op Procedure(s) (LRB): TOTAL HIP ARTHROPLASTY ANTERIOR APPROACH (Left) Active Problems:   S/P hip replacement   Status post total replacement of left hip  Estimated body mass index is 32.18 kg/m as calculated from the following:   Height as of this encounter: 5\' 8"  (1.727 m).   Weight as of this encounter: 96 kg. Advance diet Up with therapy  Work on BM Pain well controlled, improving Ice left anterior thigh VSS CM to assist with discharge to home with HHPT today or tomorrow pending progress with PT   DVT Prophylaxis - TED hose and SCDS, Xarelto Weight-Bearing as tolerated to left leg   T. , PA-C [facility] 12/07/2020, 8:30 AM

## 2020-12-07 NOTE — TOC Progression Note (Addendum)
Transition of Care [facility]) - Progression Note    Patient Details  Name: Henry Dunn MRN: [id number] Date of Birth: Aug 03, 1976  [facility] CM/SW Contact  Larwance Rote, LCSW Phone Number: 12/07/2020, 12:34 PM  Clinical Narrative:   Pt discharging home with DME [employer] delivered 3N1 and 5" rolling walker to patient's bedside. [facility] will provide home PT and will be in contact with this patient on 12/08/2020, [phone].  Expected Discharge Plan: Home w Home Health Services    Expected Discharge Plan and Services Expected Discharge Plan: Home w Home Health Services      Living arrangements for the past 2 months: Single Family Home Expected Discharge Date: 12/07/20                   Social Determinants of Health (SDOH) Interventions   Readmission Risk Interventions No flowsheet data found.

## 2020-12-07 NOTE — Progress Notes (Signed)
Physical Therapy Treatment Patient Details Name: Henry Dunn MRN: [id number] DOB: 1976-07-15 Today's Date: 12/07/2020    History of Present Illness Pt is a 45 yo male diagnosed with post-traumatic osteoarthritis of the left hip and is s/p elective left THA.  PMH includes: A-fib, smoker, SOB, covid-19 (per patient), CHF, DVT, chronic anticoagulation, s/p IVC filter, h/o blood clots, NICM, and PE.    PT Comments    Pt was sitting EOB upon arriving. He is A and O x 4 and cooperative and pleasant throughout. Highly motivated. Pt was able to stand from slightly elevated bed height without physical assistance. Had diffuclty progressing LLE to take steps however after several steps was able to advance to much improve gait abilities. Ambulate 150 ft 2 x with RW. Step to antalgic pattern but steady. Was able to demonstrate safe ability to ascend/descend 4 stair with RW and backwards step to pattern. Will return later this date for caregiver education and continued advancement towards PT goals. Pt is progressing quickly now that he is able to advance LLE in standing. DC recommendations updated to home with HHPT to follow. Pt was sitting EOB at conclsuion of sessioin with RN in room giving medication.    Follow Up Recommendations  Home health PT;Supervision for mobility/OOB;Supervision - Intermittent     Equipment Recommendations  Rolling walker with 5" wheels;3in1 (PT)    Recommendations for Other Services       Precautions / Restrictions Precautions Precautions: Anterior Hip Precaution Booklet Issued: Yes (comment) Restrictions Weight Bearing Restrictions: Yes LLE Weight Bearing: Weight bearing as tolerated    Mobility  Bed Mobility               General bed mobility comments: Ptr was sitting EOB upon arriving. Agrees to PT session and is cooperative and pleasant throughout.  Transfers Overall transfer level: Needs assistance Equipment used: Rolling walker (2 wheeled) Transfers:  Sit to/from Stand Sit to Stand: From elevated surface;Supervision         General transfer comment: pt was able to stand without physical assistance  Ambulation/Gait Ambulation/Gait assistance: Min guard;Supervision Gait Distance (Feet): 150 Feet Assistive device: Rolling walker (2 wheeled) Gait Pattern/deviations: Step-to pattern Gait velocity: decrease   General Gait Details: Pt was able to progress ambulation 150 ft with step to gait pattern. Had difficulty at first progressing LLE but after first few steps was able to advance LLE without physical assistance.   Stairs Stairs: Yes Stairs assistance: Min guard Stair Management: No rails;Backwards;With walker Number of Stairs: 4 General stair comments: Pt was able to ascend/descendstairs without rails using RW with CGA for safety. will readdress stairs when pt's caregiver arrives       Balance Overall balance assessment: Modified Independent         Cognition Arousal/Alertness: Awake/alert Behavior During Therapy: WFL for tasks assessed/performed Overall Cognitive Status: Within Functional Limits for tasks assessed      General Comments: pt is A and O x 4 and extremely motivated.             Pertinent Vitals/Pain Pain Assessment: 0-10 Pain Score: 5  Pain Location: L hip/thigh Pain Descriptors / Indicators: Operative site guarding;Sore;Tender Pain Intervention(s): Monitored during session;Limited activity within patient's tolerance;Premedicated before session;Repositioned;Ice applied           PT Goals (current goals can now be found in the care plan section) Progress towards PT goals: Progressing toward goals    Frequency    BID      PT  Plan Discharge plan needs to be updated       AM-PAC PT "6 Clicks" Mobility   Outcome Measure  Help needed turning from your back to your side while in a flat bed without using bedrails?: A Little Help needed moving from lying on your back to sitting on the  side of a flat bed without using bedrails?: A Little Help needed moving to and from a bed to a chair (including a wheelchair)?: A Little Help needed standing up from a chair using your arms (e.g., wheelchair or bedside chair)?: A Little Help needed to walk in hospital room?: A Little   6 Click Score: 15    End of Session Equipment Utilized During Treatment: Gait belt Activity Tolerance: Patient tolerated treatment well Patient left: in bed;with nursing/sitter in room Nurse Communication: Mobility status PT Visit Diagnosis: Muscle weakness (generalized) (M62.81);Other abnormalities of gait and mobility (R26.89);Pain Pain - Right/Left: Left Pain - part of body: Hip     Time: 0830-0900 PT Time Calculation (min) (ACUTE ONLY): 30 min  Charges:  $Gait Training: 23-37 mins                     Jetta Lout PTA 12/07/20, 11:02 AM

## 2020-12-07 NOTE — Discharge Summary (Signed)
Physician Discharge Summary  Patient ID: Henry Dunn MRN: [id number] DOB/AGE: 1976/01/24 45 y.o.  Admit date: 12/05/2020 Discharge date: 12/07/2020  Admission Diagnoses:  S/P hip replacement [Z96.649] Status post total replacement of left hip [Z96.642]   Discharge Diagnoses: Patient Active Problem List   Diagnosis Date Noted  . Status post total replacement of left hip 12/07/2020  . S/P hip replacement 12/05/2020  . Persistent atrial fibrillation (HCC) 04/09/2019  . Acute exacerbation of CHF (congestive heart failure) (HCC) 02/21/2019  . Lymphedema 11/07/2018  . 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 Leg pain, left 06/21/2018  . Cellulitis and abscess of leg   . Typical atrial flutter (HCC) 02/18/2017  . Chronic deep vein thrombosis (DVT) (HCC) 02/18/2017  . Smoker 02/18/2017  . DVT (deep venous thrombosis) (HCC) 02/18/2017  . Shortness of breath 02/17/2017    Past Medical History:  Diagnosis Date  . A-fib (HCC)   . CHF (congestive heart failure) (HCC)    HFrEF  . Chronic anticoagulation   . DVT (deep venous thrombosis) (HCC)    a. In setting of dislocated hip, status post IVC filter, previously on Coumadin, noncompliant over the past 12 months  . Hx of blood clots    a. Patient denies any family history of clotting disorder, patient denies prior hypercoagulable workup  . NICM (nonischemic cardiomyopathy) (HCC)   . PE (pulmonary thromboembolism) (HCC)   . Systolic murmur      Transfusion: none   Consultants (if any):   Discharged Condition: Improved  Hospital Course: Henry Dunn is an 45 y.o. male who was admitted 12/05/2020 with a diagnosis of left hip OA and went to the operating room on 12/05/2020 and underwent the above named procedures.    Surgeries: Procedure(s): TOTAL HIP ARTHROPLASTY ANTERIOR APPROACH on 12/05/2020 Patient tolerated the surgery well. Taken to PACU where she was stabilized and then transferred to the orthopedic floor.  Started on xarelto. Foot  pumps applied bilaterally at 80 mm. Heels elevated on bed with rolled towels. No evidence of DVT. Negative Homan. Physical therapy started on day #1 for gait training and transfer. OT started day #1 for ADL and assisted devices.  Patient's foley was d/c on day #1. Patient's IVwas d/c on day #2.  On post op day #2 patient was stable and ready for discharge to home with HHPT.  Implants: Medacta Masterloc 8 lateralized plus stem, 54 mm M you have the best part of the wearing N95 pact DM cup with liner and ceramic S 28 mm head  He was given perioperative antibiotics:  Anti-infectives (From admission, onward)   Start     Dose/Rate Route Frequency Ordered Stop   12/05/20 1815  ceFAZolin (ANCEF) IVPB 2g/100 mL premix  Status:  Discontinued        2 g 200 mL/hr over 30 Minutes Intravenous Every 6 hours 12/05/20 1813 12/05/20 1822   12/05/20 1630  ceFAZolin (ANCEF) IVPB 2g/100 mL premix        2 g 200 mL/hr over 30 Minutes Intravenous Every 6 hours 12/05/20 1417 12/05/20 2232   12/05/20 0856  ceFAZolin (ANCEF) 2-4 GM/100ML-% IVPB       Note to Pharmacy: Mike Craze   : cabinet override      12/05/20 0856 12/05/20 2232   12/05/20 0830  ceFAZolin (ANCEF) IVPB 2g/100 mL premix        2 g 200 mL/hr over 30 Minutes Intravenous On call to O.R. 12/05/20 [id number] 12/05/20 1026    .  He was  given sequential compression devices, early ambulation, and Xarelto TEDs for DVT prophylaxis.  He benefited maximally from the hospital stay and there were no complications.    Recent vital signs:  Vitals:   12/07/20 0800 12/07/20 0923  BP: 103/70 103/70  Pulse: 90 90  Resp: 17   Temp: 98.7 F (37.1 C)   SpO2: 98%     Recent laboratory studies:  Lab Results  Component Value Date   HGB 16.4 11/28/2020   HGB 16.4 11/10/2019   HGB 14.6 02/21/2019   Lab Results  Component Value Date   WBC 5.7 11/28/2020   PLT 175 11/28/2020   Lab Results  Component Value Date   INR 1.15 03/20/2018   Lab Results   Component Value Date   NA 143 11/28/2020   K 4.0 11/28/2020   CL 103 11/28/2020   CO2 31 11/28/2020   BUN 17 11/28/2020   CREATININE 1.12 11/28/2020   GLUCOSE 89 11/28/2020    Discharge Medications:   Allergies as of 12/07/2020   No Known Allergies     Medication List    TAKE these medications   docusate sodium 100 MG capsule Commonly known as: COLACE Take 1 capsule (100 mg total) by mouth 2 (two) times daily.   furosemide 20 MG tablet Commonly known as: LASIX Takes 20mg  in the AM and an additional 20mg  in the evening if needed   methocarbamol 500 MG tablet Commonly known as: ROBAXIN Take 1 tablet (500 mg total) by mouth every 6 (six) hours as needed for muscle spasms.   metoprolol succinate 100 MG 24 hr tablet Commonly known as: TOPROL-XL Take 1 tablet (100 mg total) by mouth daily.   oxyCODONE 5 MG immediate release tablet Commonly known as: Oxy IR/ROXICODONE Take 1-2 tablets (5-10 mg total) by mouth every 4 (four) hours as needed for moderate pain (pain score 4-6).   spironolactone 25 MG tablet Commonly known as: ALDACTONE TAKE 1/2 TABLET BY MOUTH EVERY DAY   traMADol 50 MG tablet Commonly known as: ULTRAM Take 1 tablet (50 mg total) by mouth every 6 (six) hours.   Xarelto 20 MG Tabs tablet Generic drug: rivaroxaban TAKE 1 TABLET(20 MG) BY MOUTH DAILY WITH SUPPER            Durable Medical Equipment  (From admission, onward)         Start     Ordered   12/05/20 1245  DME Walker rolling  Once       Question Answer Comment  Walker: With 5 Inch Wheels   Patient needs a walker to treat with the following condition S/P hip replacement      12/05/20 1244   12/05/20 1245  DME 3 n 1  Once        12/05/20 1244   12/05/20 1245  DME Bedside commode  Once       Question:  Patient needs a bedside commode to treat with the following condition  Answer:  S/P hip replacement   12/05/20 1244          Diagnostic Studies: DG HIP OPERATIVE UNILAT W OR W/O  PELVIS LEFT  Result Date: 12/05/2020 CLINICAL DATA:  Left hip arthroplasty EXAM: OPERATIVE LEFT HIP (WITH PELVIS IF PERFORMED) 1 VIEWS TECHNIQUE: Fluoroscopic spot image(s) were submitted for interpretation post-operatively. COMPARISON:  None. FINDINGS: 1 C-arm fluoroscopic image was obtained intraoperatively and submitted for post operative interpretation. Partially visualized left total hip arthroplasty hardware which appears in its expected alignment. No visible  periprosthetic fracture. 18 seconds of fluoroscopy time was utilized. Please see the performing provider's procedural report for further detail. IMPRESSION: As above. Electronically Signed   By: Duanne Guess D.O.   On: 12/05/2020 12:11   DG HIP UNILAT W OR W/O PELVIS 2-3 VIEWS LEFT  Result Date: 12/05/2020 CLINICAL DATA:  Status post left hip arthroplasty. EXAM: DG HIP (WITH OR WITHOUT PELVIS) 2-3V LEFT COMPARISON:  Same day. FINDINGS: Left hip prosthesis appears to be well situated. Expected postoperative changes are seen involving the surrounding soft tissues. IMPRESSION: Status post left hip arthroplasty. Electronically Signed   By: Lupita Raider M.D.   On: 12/05/2020 12:29    Disposition: Discharge disposition: 01-Home or Self Care          Follow-up Information    Evon Slack, PA-C Follow up in 2 week(s).   Specialties: Orthopedic Surgery, Emergency Medicine Contact information: [address] [place] [address] [phone]                Signed: Patience Musca 12/07/2020, 11:52 AM

## 2020-12-07 NOTE — Discharge Instructions (Signed)
ANTERIOR APPROACH TOTAL HIP REPLACEMENT POSTOPERATIVE DIRECTIONS   Hip Rehabilitation, Guidelines Following Surgery  The results of a hip operation are greatly improved after range of motion and muscle strengthening exercises. Follow all safety measures which are given to protect your hip. If any of these exercises cause increased pain or swelling in your joint, decrease the amount until you are comfortable again. Then slowly increase the exercises. Call your caregiver if you have problems or questions.   HOME CARE INSTRUCTIONS  Remove items at home which could result in a fall. This includes throw rugs or furniture in walking pathways.   ICE to the affected hip every three hours for 30 minutes at a time and then as needed for pain and swelling.  Continue to use ice on the hip for pain and swelling from surgery. You may notice swelling that will progress down to the foot and ankle.  This is normal after surgery.  Elevate the leg when you are not up walking on it.    Continue to use the breathing machine which will help keep your temperature down.  It is common for your temperature to cycle up and down following surgery, especially at night when you are not up moving around and exerting yourself.  The breathing machine keeps your lungs expanded and your temperature down.  Do not place pillow under knee, focus on keeping the knee straight while resting  DIET You may resume your previous home diet once your are discharged from the hospital.  DRESSING / WOUND CARE / SHOWERING Please remove provena negative pressure dressing on 12/14/2020 and apply honey comb dressing. Keep dressing clean and dry at all times.   ACTIVITY Walk with your walker as instructed. Use walker as long as suggested by your caregivers. Avoid periods of inactivity such as sitting longer than an hour when not asleep. This helps prevent blood clots.  You may resume a sexual relationship in one month or when given the OK by  your doctor.  You may return to work once you are cleared by your doctor.  Do not drive a car for 6 weeks or until released by you surgeon.  Do not drive while taking narcotics.  WEIGHT BEARING Weight bearing as tolerated. Use walker/cane as needed for at least 4 weeks post op.  POSTOPERATIVE CONSTIPATION PROTOCOL Constipation - defined medically as fewer than three stools per week and severe constipation as less than one stool per week.  One of the most common issues patients have following surgery is constipation.  Even if you have a regular bowel pattern at home, your normal regimen is likely to be disrupted due to multiple reasons following surgery.  Combination of anesthesia, postoperative narcotics, change in appetite and fluid intake all can affect your bowels.  In order to avoid complications following surgery, here are some recommendations in order to help you during your recovery period.  Colace (docusate) - Pick up an over-the-counter form of Colace or another stool softener and take twice a day as long as you are requiring postoperative pain medications.  Take with a full glass of water daily.  If you experience loose stools or diarrhea, hold the colace until you stool forms back up.  If your symptoms do not get better within 1 week or if they get worse, check with your doctor.  Dulcolax (bisacodyl) - Pick up over-the-counter and take as directed by the product packaging as needed to assist with the movement of your bowels.  Take with a  full glass of water.  Use this product as needed if not relieved by Colace only.  ° °MiraLax (polyethylene glycol) - Pick up over-the-counter to have on hand.  MiraLax is a solution that will increase the amount of water in your bowels to assist with bowel movements.  Take as directed and can mix with a glass of water, juice, soda, coffee, or tea.  Take if you go more than two days without a movement. °Do not use MiraLax more than once per day. Call your  doctor if you are still constipated or irregular after using this medication for 7 days in a row. ° °If you continue to have problems with postoperative constipation, please contact the office for further assistance and recommendations.  If you experience "the worst abdominal pain ever" or develop nausea or vomiting, please contact the office immediatly for further recommendations for treatment. ° °ITCHING ° If you experience itching with your medications, try taking only a single pain pill, or even half a pain pill at a time.  You can also use Benadryl over the counter for itching or also to help with sleep.  ° °TED HOSE STOCKINGS °Wear the elastic stockings on both legs for six weeks following surgery during the day but you may remove then at night for sleeping. ° °MEDICATIONS °See your medication summary on the “After Visit Summary” that the nursing staff will review with you prior to discharge.  You may have some home medications which will be placed on hold until you complete the course of blood thinner medication.  It is important for you to complete the blood thinner medication as prescribed by your surgeon.  Continue your approved medications as instructed at time of discharge. ° °PRECAUTIONS °If you experience chest pain or shortness of breath - call 911 immediately for transfer to the hospital emergency department.  °If you develop a fever greater that 101 F, purulent drainage from wound, increased redness or drainage from wound, foul odor from the wound/dressing, or calf pain - CONTACT YOUR SURGEON.   °                                                °FOLLOW-UP APPOINTMENTS °Make sure you keep all of your appointments after your operation with your surgeon and caregivers. You should call the office at the above phone number and make an appointment for approximately two weeks after the date of your surgery or on the date instructed by your surgeon outlined in the "After Visit Summary". ° °RANGE OF MOTION  AND STRENGTHENING EXERCISES  °These exercises are designed to help you keep full movement of your hip joint. Follow your caregiver's or physical therapist's instructions. Perform all exercises about fifteen times, three times per day or as directed. Exercise both hips, even if you have had only one joint replacement. These exercises can be done on a training (exercise) mat, on the floor, on a table or on a bed. Use whatever works the best and is most comfortable for you. Use music or television while you are exercising so that the exercises are a pleasant break in your day. This will make your life better with the exercises acting as a break in routine you can look forward to.  °Lying on your back, slowly slide your foot toward your buttocks, raising your knee up off the floor. Then   slowly slide your foot back down until your leg is straight again.  °Lying on your back spread your legs as far apart as you can without causing discomfort.  °Lying on your side, raise your upper leg and foot straight up from the floor as far as is comfortable. Slowly lower the leg and repeat.  °Lying on your back, tighten up the muscle in the front of your thigh (quadriceps muscles). You can do this by keeping your leg straight and trying to raise your heel off the floor. This helps strengthen the largest muscle supporting your knee.  °Lying on your back, tighten up the muscles of your buttocks both with the legs straight and with the knee bent at a comfortable angle while keeping your heel on the floor.  ° °IF YOU ARE TRANSFERRED TO A SKILLED REHAB FACILITY °If the patient is transferred to a skilled rehab facility following release from the hospital, a list of the current medications will be sent to the facility for the patient to continue.  When discharged from the skilled rehab facility, please have the facility set up the patient's Home Health Physical Therapy prior to being released. Also, the skilled facility will be responsible  for providing the patient with their medications at time of release from the facility to include their pain medication, the muscle relaxants, and their blood thinner medication. If the patient is still at the rehab facility at time of the two week follow up appointment, the skilled rehab facility will also need to assist the patient in arranging follow up appointment in our office and any transportation needs. ° °MAKE SURE YOU:  °Understand these instructions.  °Get help right away if you are not doing well or get worse.  ° ° °Pick up stool softner and laxative for home use following surgery while on pain medications. °Continue to use ice for pain and swelling after surgery. °Do not use any lotions or creams on the incision until instructed by your surgeon. ° °

## 2020-12-09 LAB — SURGICAL PATHOLOGY

## 2020-12-10 ENCOUNTER — Encounter: Payer: Self-pay | Admitting: Cardiovascular Disease

## 2020-12-24 ENCOUNTER — Encounter: Payer: Self-pay | Admitting: Orthopedic Surgery

## 2020-12-29 ENCOUNTER — Other Ambulatory Visit: Payer: Self-pay | Admitting: Cardiovascular Disease

## 2021-01-17 ENCOUNTER — Other Ambulatory Visit: Payer: Self-pay | Admitting: Cardiovascular Disease

## 2021-01-17 NOTE — Telephone Encounter (Signed)
Spoke with the patient regarding the Furosemide refill. The patient is taking the Furosemide 40 mg one tablet daily per the patient he was told to take Furosemide 40 mg one tablet daily but if he noticed a weight gain >3 lbs, he may take an extra tablet. Please review and contact the patient regarding the refill.

## 2021-01-20 ENCOUNTER — Other Ambulatory Visit: Payer: Self-pay

## 2021-01-20 MED ORDER — FUROSEMIDE 20 MG PO TABS: 20.0000 mg | ORAL_TABLET | Freq: Two times a day (BID) | ORAL | 3 refills | Status: DC | PRN

## 2021-02-26 ENCOUNTER — Other Ambulatory Visit: Payer: Self-pay | Admitting: Cardiovascular Disease

## 2021-02-26 NOTE — Telephone Encounter (Signed)
Pt's age 45, wt 96 kg, SCr 1.12, CrCl 160, last ov w/ G 12/03/20.

## 2021-02-26 NOTE — Telephone Encounter (Signed)
Please review for refill. Thanks!  

## 2021-04-08 ENCOUNTER — Telehealth: Payer: Self-pay | Admitting: Cardiovascular Disease

## 2021-04-08 NOTE — Telephone Encounter (Signed)
Patient with diagnosis of afib/aflutter on Xarelto for anticoagulation. Also has history of recurrent VTE - prior DVT/PE in the setting of dislocated hip in 2006 secondary to mechanical fall, DVT in 2019 s/p thrombectomy in the setting of noncompliance with warfarin (then transitioned to Xarelto), PE also noted on PMH.  Procedure: Excision eyebrow cyst Date of procedure: TBD  CHA2DS2-VASc Score = 2  This indicates a 2.2% annual risk of stroke. The patient's score is based upon: CHF History: Yes HTN History: No Diabetes History: No Stroke History: No Vascular Disease History: Yes Age Score: 0 Gender Score: 0     CrCl 93 ml/min Platelet count 175  Per office protocol, patient can hold Eliquis for 1 day prior to procedure.

## 2021-04-08 NOTE — Telephone Encounter (Signed)
Pharmacy, can you please comment on how long Xarelto can be held for upcoming procedure? They are asking for a 7 day hold which seems a little excessive.  Thank you!

## 2021-04-08 NOTE — Telephone Encounter (Signed)
   [facility] Pre-operative Risk Assessment    Patient Name: Henry Dunn  DOB: Dec 08, 1975  MRN: [id number]   HEARTCARE STAFF: - Please ensure there is not already an duplicate clearance open for this procedure. - Under Visit Info/Reason for Call, type in Other and utilize the format Clearance MM/DD/YY or Clearance TBD. Do not use dashes or single digits. - If request is for dental extraction, please clarify the # of teeth to be extracted.  Request for surgical clearance:  1. What type of surgery is being performed? Excision eyebrow cyst  2. When is this surgery scheduled? TBD  3. What type of clearance is required (medical clearance vs. Pharmacy clearance to hold med vs. Both)? both  4. Are there any medications that need to be held prior to surgery and how long? Needs to be off Xarelto 7 days prior to surgery  5. Practice name and name of physician performing surgery? [facility] - Dr Pryor Ochoa  6. What is the office phone number? [phone]   7.   What is the office fax number? [phone]  8.   Anesthesia type (None, local, MAC, general) ? General    Caryl Pina Gerringer 04/08/2021, 2:33 PM  _________________________________________________________________   (provider comments below)

## 2021-04-09 NOTE — Telephone Encounter (Signed)
   Name: Henry Dunn  DOB: [birth year]  MRN: [id number]   Primary Cardiologist: Julien Nordmann, MD  Chart reviewed as part of pre-operative protocol coverage. Patient was contacted 04/09/2021 in reference to pre-operative risk assessment for pending surgery as outlined below.  Torez Beauregard was last seen on 12/03/20 by Dr. Mariah Milling.  Since that day, Ayaan Shutes has done well from a cardiac standpoint. He can easily complete 4 METs without anginal complaints.  Therefore, based on ACC/AHA guidelines, the patient would be at acceptable risk for the planned procedure without further cardiovascular testing.   The patient was advised that if he develops new symptoms prior to surgery to contact our office to arrange for a follow-up visit, and he verbalized understanding.  Per pharmacy recommendations, patient can hold xarelto 1 day prior to his upcoming procedure with plans to restart when cleared to do so by his ENT provider.   I will route this recommendation to the requesting party via Epic fax function and remove from pre-op pool. Please call with questions.  Beatriz Stallion, PA-C 04/09/2021, 1:07 PM

## 2021-04-29 ENCOUNTER — Encounter: Payer: Self-pay | Admitting: Otolaryngology

## 2021-05-14 ENCOUNTER — Encounter
Admission: RE | Disposition: A | Discharge status: Home / Self Care | Payer: Self-pay | Source: Home / Self Care | Attending: Otolaryngology

## 2021-05-14 ENCOUNTER — Ambulatory Visit
Admission: RE | Admit: 2021-05-14 | Discharge: 2021-05-14 | Disposition: A | Discharge status: Home / Self Care | Payer: Medicare Other | Attending: Otolaryngology | Admitting: Otolaryngology

## 2021-05-14 ENCOUNTER — Ambulatory Visit: Payer: Medicare Other | Admitting: Anesthesiology

## 2021-05-14 ENCOUNTER — Encounter: Payer: Self-pay | Admitting: Otolaryngology

## 2021-05-14 ENCOUNTER — Other Ambulatory Visit: Payer: Self-pay

## 2021-05-14 DIAGNOSIS — I509 Heart failure, unspecified: Secondary | ICD-10-CM | POA: Diagnosis not present

## 2021-05-14 DIAGNOSIS — Z7901 Long term (current) use of anticoagulants: Secondary | ICD-10-CM | POA: Insufficient documentation

## 2021-05-14 DIAGNOSIS — Z79899 Other long term (current) drug therapy: Secondary | ICD-10-CM | POA: Diagnosis not present

## 2021-05-14 DIAGNOSIS — F17200 Nicotine dependence, unspecified, uncomplicated: Secondary | ICD-10-CM | POA: Insufficient documentation

## 2021-05-14 DIAGNOSIS — L720 Epidermal cyst: Secondary | ICD-10-CM | POA: Insufficient documentation

## 2021-05-14 DIAGNOSIS — Z86718 Personal history of other venous thrombosis and embolism: Secondary | ICD-10-CM | POA: Insufficient documentation

## 2021-05-14 DIAGNOSIS — Z86711 Personal history of pulmonary embolism: Secondary | ICD-10-CM | POA: Insufficient documentation

## 2021-05-14 DIAGNOSIS — Z7902 Long term (current) use of antithrombotics/antiplatelets: Secondary | ICD-10-CM | POA: Diagnosis not present

## 2021-05-14 HISTORY — PX: EAR CYST EXCISION: SHX22

## 2021-05-14 SURGERY — EXCISION, CYST, EAR
Anesthesia: General | Site: Eye | Laterality: Left

## 2021-05-14 MED ORDER — ACETAMINOPHEN 160 MG/5ML PO SOLN: 325.0000 mg | ORAL | Status: DC | PRN

## 2021-05-14 MED ORDER — DEXMEDETOMIDINE HCL 200 MCG/2ML IV SOLN
INTRAVENOUS | Status: DC | PRN
  Administered 2021-05-14: 5 ug via INTRAVENOUS

## 2021-05-14 MED ORDER — FENTANYL CITRATE (PF) 100 MCG/2ML IJ SOLN
INTRAMUSCULAR | Status: DC | PRN
  Administered 2021-05-14 (×2): 50 ug via INTRAVENOUS

## 2021-05-14 MED ORDER — PROPOFOL 10 MG/ML IV BOLUS
INTRAVENOUS | Status: DC | PRN
  Administered 2021-05-14: 200 mg via INTRAVENOUS

## 2021-05-14 MED ORDER — LACTATED RINGERS IV SOLN: INTRAVENOUS | Status: DC

## 2021-05-14 MED ORDER — LIDOCAINE HCL (CARDIAC) PF 100 MG/5ML IV SOSY
PREFILLED_SYRINGE | INTRAVENOUS | Status: DC | PRN
  Administered 2021-05-14: 50 mg via INTRAVENOUS

## 2021-05-14 MED ORDER — LIDOCAINE-EPINEPHRINE 1 %-1:100000 IJ SOLN
INTRAMUSCULAR | Status: DC | PRN
  Administered 2021-05-14: 1 mL via INTRADERMAL

## 2021-05-14 MED ORDER — BACITRACIN 500 UNIT/GM EX OINT
TOPICAL_OINTMENT | CUTANEOUS | Status: DC | PRN
  Administered 2021-05-14: 1 via TOPICAL

## 2021-05-14 MED ORDER — MIDAZOLAM HCL 5 MG/5ML IJ SOLN
INTRAMUSCULAR | Status: DC | PRN
  Administered 2021-05-14: 2 mg via INTRAVENOUS

## 2021-05-14 MED ORDER — ONDANSETRON HCL 4 MG/2ML IJ SOLN
INTRAMUSCULAR | Status: DC | PRN
  Administered 2021-05-14: 4 mg via INTRAVENOUS

## 2021-05-14 MED ORDER — BACITRACIN 500 UNIT/GM EX OINT: 1.0000 "application " | TOPICAL_OINTMENT | Freq: Two times a day (BID) | CUTANEOUS | 0 refills | Status: DC

## 2021-05-14 MED ORDER — ACETAMINOPHEN 325 MG PO TABS
325.0000 mg | ORAL_TABLET | ORAL | Status: DC | PRN
Start: 2021-05-14 — End: 2021-05-14

## 2021-05-14 SURGICAL SUPPLY — 28 items
CANISTER SUCT 1200ML W/VALVE (MISCELLANEOUS) ×2 IMPLANT
CORD BIP STRL DISP 12FT (MISCELLANEOUS) ×2 IMPLANT
DRAPE HEAD BAR (DRAPES) ×2 IMPLANT
DRSG GLASSCOCK MASTOID ADT (GAUZE/BANDAGES/DRESSINGS) ×2 IMPLANT
ELECT CAUTERY NEEDLE 2.0 MIC (NEEDLE) ×2 IMPLANT
ELECT REM PT RETURN 9FT ADLT (ELECTROSURGICAL) ×2
ELECTRODE REM PT RTRN 9FT ADLT (ELECTROSURGICAL) ×1 IMPLANT
GOWN STRL REUS W/ TWL LRG LVL3 (GOWN DISPOSABLE) ×1 IMPLANT
GOWN STRL REUS W/TWL LRG LVL3 (GOWN DISPOSABLE) ×2
KIT TURNOVER KIT A (KITS) ×2 IMPLANT
NEEDLE HYPO 25GX1X1/2 BEV (NEEDLE) ×2 IMPLANT
NS IRRIG 500ML POUR BTL (IV SOLUTION) ×2 IMPLANT
PACK ENT CUSTOM (PACKS) ×2 IMPLANT
PENCIL SMOKE EVACUATOR (MISCELLANEOUS) ×2 IMPLANT
SOL PREP PVP 2OZ (MISCELLANEOUS) ×2
SOLUTION PREP PVP 2OZ (MISCELLANEOUS) ×1 IMPLANT
STRAP BODY AND KNEE 60X3 (MISCELLANEOUS) ×2 IMPLANT
SUCTION FRAZIER HANDLE 10FR (MISCELLANEOUS) ×1
SUCTION TUBE FRAZIER 10FR DISP (MISCELLANEOUS) ×1 IMPLANT
SUT ETHILON 6 0 9-3 1X18 BLK (SUTURE) ×2 IMPLANT
SUT PLAIN GUT FAST 5-0 (SUTURE) ×2 IMPLANT
SUT PROLENE 5 0 P 3 (SUTURE) ×2 IMPLANT
SUT VIC AB 4-0 RB1 27 (SUTURE) ×2
SUT VIC AB 4-0 RB1 27X BRD (SUTURE) ×1 IMPLANT
SUT VIC AB 5-0 P-3 18X BRD (SUTURE) ×1 IMPLANT
SUT VIC AB 5-0 P3 18 (SUTURE) ×2
SYR 10ML LL (SYRINGE) ×2 IMPLANT
TOWEL OR 17X26 4PK STRL BLUE (TOWEL DISPOSABLE) ×2 IMPLANT

## 2021-05-14 NOTE — Anesthesia Postprocedure Evaluation (Signed)
Anesthesia Post Note  Patient: Henry Dunn  Procedure(s) Performed: EXCISION EYEBROW CYST (Left: Eye)     Patient location during evaluation: PACU Anesthesia Type: General Level of consciousness: awake and alert Pain management: pain level controlled Vital Signs Assessment: post-procedure vital signs reviewed and stable Respiratory status: spontaneous breathing, nonlabored ventilation, respiratory function stable and patient connected to nasal cannula oxygen Cardiovascular status: blood pressure returned to baseline and stable Postop Assessment: no apparent nausea or vomiting Anesthetic complications: no   No notable events documented.  Alta Corning

## 2021-05-14 NOTE — Anesthesia Procedure Notes (Signed)
Procedure Name: LMA Insertion Date/Time: 05/14/2021 9:54 AM Performed by: Jinny Blossom, CRNA Pre-anesthesia Checklist: Patient identified, Emergency Drugs available, Suction available, Patient being monitored and Timeout performed Patient Re-evaluated:Patient Re-evaluated prior to induction Oxygen Delivery Method: Circle system utilized Preoxygenation: Pre-oxygenation with 100% oxygen Induction Type: IV induction Ventilation: Mask ventilation without difficulty LMA: LMA inserted LMA Size: 4.0 Number of attempts: 1 Placement Confirmation: ETT inserted through vocal cords under direct vision and positive ETCO2 Tube secured with: Tape Dental Injury: Teeth and Oropharynx as per pre-operative assessment

## 2021-05-14 NOTE — Transfer of Care (Signed)
Immediate Anesthesia Transfer of Care Note  Patient: Henry Dunn  Procedure(s) Performed: EXCISION EYEBROW CYST (Left: Eye)  Patient Location: PACU  Anesthesia Type: General  Level of Consciousness: awake, alert  and patient cooperative  Airway and Oxygen Therapy: Patient Spontanous Breathing and Patient connected to supplemental oxygen  Post-op Assessment: Post-op Vital signs reviewed, Patient's Cardiovascular Status Stable, Respiratory Function Stable, Patent Airway and No signs of Nausea or vomiting  Post-op Vital Signs: Reviewed and stable  Complications: No notable events documented.

## 2021-05-14 NOTE — Op Note (Signed)
..  05/14/2021  10:21 AM    [name]  [id number]   Pre-Op Dx:  cyst  Post-op Dx: cyst  Proc:Excision of left forehead mass  Surg: Roney Mans Jackqulyn Mendel  Anes:  General by laryngeal mask  EBL:  None  Comp:  None  Findings:  <6ml  Procedure: With the patient in a comfortable supine position, general laryngeal mask anesthesia was administered.  Injection of 43ml of 1% lidocaine with 1:100,000 epinephrine was made into previously marked skin crease just above the patient's left eyebrow.  The patient was prepped and draped in a sterile fashion.  At this time, a 15 blade scalpel was used to make initial skin incision in previously marked skin crease.  Dissected was made with scalpel and hemostat until the mass was located.  This was consistent with an epidermal inclusion cyst.  This was circumferentialy dissected with hemostat and bipolar electrocautery.  The mass was ruptured at time of delivery from the wound.  All contents of the cyst including sack and contents were removed and sent for permanent pathological evaluation.  The wound was copiously irrigated with sterile saline.  The skin was closed in a multilayered fashion with deep vicryl and cutaneous running 6.0 Nylon.  Following this  The patient was returned to anesthesia, awakened, and transferred to recovery in stable condition.  Dispo:  PACU to home  Plan: Follow up Tuesday for suture removal and pathological review.  Restart Xarelto tonight.   Roney Mans Reshanda Lewey 10:21 AM 05/14/2021

## 2021-05-14 NOTE — H&P (Signed)
..  History and Physical paper copy reviewed and updated date of procedure and will be scanned into system.  Patient seen and examined and marked over left eyebrow.

## 2021-05-14 NOTE — Anesthesia Preprocedure Evaluation (Signed)
Anesthesia Evaluation  Patient identified by MRN, date of birth, ID band Patient awake    Reviewed: Allergy & Precautions, H&P , NPO status , Patient's Chart, lab work & pertinent test results, reviewed documented beta blocker date and time   Airway Mallampati: II  TM Distance: >3 FB Neck ROM: full    Dental no notable dental hx.    Pulmonary shortness of breath, Current Smoker and Patient abstained from smoking.,    Pulmonary exam normal breath sounds clear to auscultation       Cardiovascular Exercise Tolerance: Good +CHF  Normal cardiovascular exam Rhythm:regular Rate:Normal  Hx of non-ischemic cardiomyopathy   Neuro/Psych negative neurological ROS  negative psych ROS   GI/Hepatic negative GI ROS, Neg liver ROS,   Endo/Other  negative endocrine ROS  Renal/GU negative Renal ROS  negative genitourinary   Musculoskeletal   Abdominal   Peds  Hematology negative hematology ROS (+) Hx of DVT/PE s/p IVC filter and chronic anitcoagulation   Anesthesia Other Findings   Reproductive/Obstetrics negative OB ROS                             Anesthesia Physical Anesthesia Plan  ASA: 2  Anesthesia Plan: General   Post-op Pain Management:    Induction:   PONV Risk Score and Plan:   Airway Management Planned:   Additional Equipment:   Intra-op Plan:   Post-operative Plan:   Informed Consent: I have reviewed the patients History and Physical, chart, labs and discussed the procedure including the risks, benefits and alternatives for the proposed anesthesia with the patient or authorized representative who has indicated his/her understanding and acceptance.     Dental Advisory Given  Plan Discussed with: CRNA and Anesthesiologist  Anesthesia Plan Comments:         Anesthesia Quick Evaluation

## 2021-05-15 ENCOUNTER — Encounter: Payer: Self-pay | Admitting: Otolaryngology

## 2021-05-15 LAB — SURGICAL PATHOLOGY

## 2021-06-16 ENCOUNTER — Ambulatory Visit (INDEPENDENT_AMBULATORY_CARE_PROVIDER_SITE_OTHER): Payer: Medicare Other | Admitting: Vascular Surgery

## 2021-06-24 ENCOUNTER — Encounter: Payer: Self-pay | Admitting: Cardiovascular Disease

## 2021-06-24 ENCOUNTER — Other Ambulatory Visit: Payer: Self-pay

## 2021-06-24 ENCOUNTER — Ambulatory Visit (INDEPENDENT_AMBULATORY_CARE_PROVIDER_SITE_OTHER): Payer: Medicare Other | Admitting: Cardiovascular Disease

## 2021-06-24 VITALS — BP 100/80 | HR 87 | Ht 68.0 in | Wt 202.2 lb

## 2021-06-24 DIAGNOSIS — I4811 Longstanding persistent atrial fibrillation: Secondary | ICD-10-CM | POA: Diagnosis not present

## 2021-06-24 DIAGNOSIS — I428 Other cardiomyopathies: Secondary | ICD-10-CM | POA: Diagnosis not present

## 2021-06-24 DIAGNOSIS — Z86718 Personal history of other venous thrombosis and embolism: Secondary | ICD-10-CM

## 2021-06-24 DIAGNOSIS — F17200 Nicotine dependence, unspecified, uncomplicated: Secondary | ICD-10-CM

## 2021-06-24 DIAGNOSIS — I5022 Chronic systolic (congestive) heart failure: Secondary | ICD-10-CM

## 2021-06-24 DIAGNOSIS — Z86711 Personal history of pulmonary embolism: Secondary | ICD-10-CM

## 2021-06-24 MED ORDER — TADALAFIL 20 MG PO TABS: 20.0000 mg | ORAL_TABLET | Freq: Every day | ORAL | 1 refills | Status: DC | PRN

## 2021-06-24 NOTE — Patient Instructions (Addendum)
  Medication Instructions:   Cialis 20 mg daily as needed - A printed Rx has been given to you today  If you need a refill on your cardiac medications before your next appointment, please call your pharmacy.    Lab work:  No new labs needed   Testing/Procedures:  Your physician has requested that you have an echocardiogram in September 2022. Echocardiography is a painless test that uses sound waves to create images of your heart. It provides your doctor with information about the size and shape of your heart and how well your heart's chambers and valves are working. This procedure takes approximately one hour. There are no restrictions for this procedure.    Follow-Up: At [facility], you and your health needs are our priority.  As part of our continuing mission to provide you with exceptional heart care, we have created designated Provider Care Teams.  These Care Teams include your primary Cardiologist (physician) and Advanced Practice Providers (APPs -  Physician Assistants and Nurse Practitioners) who all work together to provide you with the care you need, when you need it.  You will need a follow up appointment in 12 months  Providers on your designated Care Team:   Nicolasa Ducking, NP Eula Listen, PA-C Marisue Ivan, PA-C Cadence Fransico Michael, [place]  Any Other Special Instructions Will Be Listed Below (If Applicable).  COVID-19 Vaccine Information can be found at: [email or web address] For questions related to vaccine distribution or appointments, please email [email or web address] or call [phone].

## 2021-06-24 NOTE — Progress Notes (Signed)
Cardiology Office Note  Date:  06/24/2021   ID:  Henry Dunn, DOB 1976-02-15, MRN [id number]  PCP:  [facility]   Chief Complaint  Patient presents with   Other    6 month f/u no complaints today. Meds reviewed verbally with pt.    HPI:  Henry Dunn is a 45 year old gentleman with past medical history of Left hip dislocation right lower extremity DVT in the past, had IVC filter Chronic smoker, shortness of breath developed atrial fibrillation with RVR February 23 2017 after having groin abscess debrided, started on warfarin RT leg thrombectomy    DVT April 2019, noncompliance with warfarin IVC filter in place Echo 01/2019 left ventricle has severely reduced systolic function, with an ejection fraction of 20-25%. Atrial fibrillation starting early 2019 EF 35 to 40% on f/u echocardiogram, 9/21 He presents today for routine follow-up of his atrial fibrillation  In follow-up today reports that he is feeling relatively well Working, no symptoms of chest pain or shortness of breath Denies leg swelling no PND orthopnea Taking Lasix daily, rarely extra Lasix On Xarelto, spironolactone, metoprolol succinate Cutting back on smoking, alcohol  Discussed prior echocardiograms EF 35 to 40% on recent echocardiogram, climb from the 20 percent  EKG personally reviewed by myself on todays visit Atrial fib with rate 87 bpm no significant ST or T wave changes  Other past medical hx Hospital admission March 2020 acute on chronic diastolic and systolic CHF Presented in atrial fibrillation with RVR Reported having 1 month of shortness of breath Ejection fraction 20 to 25%, was not seen by cardiology  EKG at [facility] showing atrial fibrillation in February 2020  Several trips to the emergency room in February 2020  Echocardiogram March 2020  1. The left ventricle has severely reduced systolic function, with an ejection fraction of 20-25%. The cavity size was moderate to  severely dilated. Left ventricular diastolic function could not be evaluated. Elevated left ventricular end-diastolic  pressure Left ventricular diffuse hypokinesis.  2. Left atrial size was moderately dilated.  3. Right atrial size was mildly dilated.  4. Mitral valve regurgitation is moderate by color flow Doppler.   March 2018 Was noted to be in atrial fibrillation/flutter Converted to normal sinus rhythm in the hospital In the hospital had paroxysmal A. fib/flutter, started on metoprolol CHADS2VASc at least 1 (vascular disease)   Echocardiogram 02/18/2017 - Left ventricle: The cavity size was mildly dilated. The estimated ejection fraction was in the range of 55% to 60%. Wall motion was normal - Left atrium: The atrium was mildly dilated.    PMH:   has a past medical history of A-fib (HCC), CHF (congestive heart failure) (HCC), Chronic anticoagulation, DVT (deep venous thrombosis) (HCC), blood clots, NICM (nonischemic cardiomyopathy) (HCC), PE (pulmonary thromboembolism) (HCC), and Systolic murmur.  PSH:    Past Surgical History:  Procedure Laterality Date   EAR CYST EXCISION Left 05/14/2021   Procedure: EXCISION EYEBROW CYST;  Surgeon: Bud Face, MD;  Location: [facility];  Service: ENT;  Laterality: Left;   INSERTION OF VENA CAVA FILTER     PERIPHERAL VASCULAR THROMBECTOMY Right 03/18/2018   Procedure: PERIPHERAL VASCULAR THROMBECTOMY;  Surgeon: Renford Dills, MD;  Location: [facility] INVASIVE CV LAB;  Service: Cardiovascular;  Laterality: Right;   TOTAL HIP ARTHROPLASTY Left 12/05/2020   Procedure: TOTAL HIP ARTHROPLASTY ANTERIOR APPROACH;  Surgeon: Kennedy Bucker, MD;  Location: [facility] ORS;  Service: Orthopedics;  Laterality: Left;    Current Outpatient Medications  Medication Sig  Dispense Refill   furosemide (LASIX) 20 MG tablet Take 1 tablet (20 mg total) by mouth 2 (two) times daily as needed. Takes 20 mg in the AM and an additional 20mg  in the evening for weight  gain >3 lbs or swelling 90 tablet 3   metoprolol succinate (TOPROL-XL) 100 MG 24 hr tablet Take 1 tablet (100 mg total) by mouth daily. 90 tablet 3   spironolactone (ALDACTONE) 25 MG tablet TAKE 1/2 TABLET BY MOUTH EVERY DAY 45 tablet 2   tadalafil (CIALIS) 20 MG tablet Take 1 tablet (20 mg total) by mouth daily as needed for erectile dysfunction. 30 tablet 1   XARELTO 20 MG TABS tablet TAKE 1 TABLET(20 MG) BY MOUTH DAILY WITH SUPPER 90 tablet 1   bacitracin 500 UNIT/GM ointment Apply 1 application topically 2 (two) times daily. (Patient not taking: Reported on 06/24/2021) 15 g 0   docusate sodium (COLACE) 100 MG capsule Take 1 capsule (100 mg total) by mouth 2 (two) times daily. (Patient not taking: Reported on 06/24/2021) 10 capsule 0   No current facility-administered medications for this visit.    Allergies:   Patient has no known allergies.   Social History:  The patient  reports that he has been smoking cigarettes. He has been smoking an average of .25 packs per day. He has never used smokeless tobacco. He reports current alcohol use of about 12.0 standard drinks of alcohol per week. He reports current drug use. Drug: Cocaine.   Family History:   family history is not on file.    Review of Systems: Review of Systems  Constitutional: Negative.   HENT: Negative.    Respiratory: Negative.    Cardiovascular: Negative.   Gastrointestinal: Negative.   Musculoskeletal: Negative.   Neurological: Negative.   Psychiatric/Behavioral: Negative.    All other systems reviewed and are negative.  PHYSICAL EXAM: VS:  BP 100/80 (BP Location: Left Arm, Patient Position: Sitting, Cuff Size: Normal)   Pulse 87   Ht 5\' 8"  (1.727 m)   Wt 202 lb 4 oz (91.7 kg)   SpO2 98%   BMI 30.75 kg/m  , BMI Body mass index is 30.75 kg/m. Constitutional:  oriented to person, place, and time. No distress.  HENT:  Head: Grossly normal Eyes:  no discharge. No scleral icterus.  Neck: No JVD, no carotid bruits   Cardiovascular: Regular rate and rhythm, no murmurs appreciated Pulmonary/Chest: Clear to auscultation bilaterally, no wheezes or rails Abdominal: Soft.  no distension.  no tenderness.  Musculoskeletal: Normal range of motion Neurological:  normal muscle tone. Coordination normal. No atrophy Skin: Skin warm and dry Psychiatric: normal affect, pleasant  Recent Labs: 11/28/2020: ALT 28; BUN 17; Creatinine, Ser 1.12; Hemoglobin 16.4; Platelets 175; Potassium 4.0; Sodium 143    Lipid Panel No results found for: CHOL, HDL, LDLCALC, TRIG    Wt Readings from Last 3 Encounters:  06/24/21 202 lb 4 oz (91.7 kg)  05/14/21 208 lb (94.3 kg)  12/05/20 211 lb 10.3 oz (96 kg)     ASSESSMENT AND PLAN: Atrial fib, persistent On Xarelto, rate controlled on metoprolol Has been in atrial fibrillation past 2 years, Previously declined cardioversion secondary to psychosocial issues Improved ejection fraction 35 to 40%, stable Repeat echocardiogram later 2022 for comparison Discussed adding digoxin for rate control, he would like to hold off for now   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 He will continue to work on this and does not want any assistance with chantix.   Acute on chronic diastolic and systolic CHF Previous EF 25% up to 35 to 40% Currently on spironolactone metoprolol Lasix Suggested Farxiga/Jardiance, digoxin He would like to delay adding additional medications at this time After further discussion, repeat echocardiogram ordered  If ejection fraction does not continue to improve consider adding Jardiance/Farxiga, digoxin    Total encounter time  more than 25 minutes  Greater than 50% was spent in counseling and coordination of care with the patient   Signed, Dossie Arbour, M.D., Ph.D. [facility] [place], [place] [phone]

## 2021-06-30 ENCOUNTER — Ambulatory Visit (INDEPENDENT_AMBULATORY_CARE_PROVIDER_SITE_OTHER): Payer: Medicare Other | Admitting: Vascular Surgery

## 2021-07-09 NOTE — Progress Notes (Deleted)
MRN : [id number]  Henry Dunn is a 45 y.o. (1976-05-09) male who presents with chief complaint of past blood clot.  History of Present Illness:   The patient presents to the office for evaluation of DVT.  DVT was identified at [facility] by Duplex ultrasound.  The initial symptoms were pain and swelling in the lower extremity.  He has a history of left leg DVT remotes treated at another institution at which time an IVC filter was placed.   On April 19,2019 he underwent: 1.   Ultrasound guidance for vascular access to the bilateral Superficial femoral veins  2.   Catheter placement into the inferior vena cava bilateral femoral vein approach  3.   Inferior venacavogram 4.   Bilateral lower extremity venogram 5.    Angioplasty and stent placement with the Renovo stent bilateral common iliac veins 6.    Infusion thrombolysis with 16 mg of TPA 7.    Mechanical thrombectomy of the bilateral common femoral veins, bilateral external iliac veins, bilateral common iliac veins and the infrarenal portion of the inferior vena cava using the penumbra cat 8 device   The patient notes the left leg continues to be tight with dependency and swells quite a bite every day.  Symptoms are much better with elevation.  The patient notes less edema in the morning which steadily worsens throughout the day.  Overall he feels he is better than the last visit    The patient has been using knee highcompression therapy at this point.   No SOB or pleuritic chest pains.  No cough or hemoptysis.   No blood per rectum or blood in any sputum.  No excessive bruising per the patient.   No outpatient medications have been marked as taking for the 07/10/21 encounter (Appointment) with Gilda Crease, Latina Craver, MD.    Past Medical History:  Diagnosis Date   A-fib [facility])    CHF (congestive heart failure) (HCC)    HFrEF   Chronic anticoagulation    DVT (deep venous thrombosis) (HCC)    a. In setting of dislocated hip, status post IVC  filter, previously on Coumadin, noncompliant over the past 12 months   Hx of blood clots    a. Patient denies any family history of clotting disorder, patient denies prior hypercoagulable workup   NICM (nonischemic cardiomyopathy) (HCC)    PE (pulmonary thromboembolism) (HCC)    Systolic murmur     Past Surgical History:  Procedure Laterality Date   EAR CYST EXCISION Left 05/14/2021   Procedure: EXCISION EYEBROW CYST;  Surgeon: Bud Face, MD;  Location: [facility];  Service: ENT;  Laterality: Left;   INSERTION OF VENA CAVA FILTER     PERIPHERAL VASCULAR THROMBECTOMY Right 03/18/2018   Procedure: PERIPHERAL VASCULAR THROMBECTOMY;  Surgeon: Renford Dills, MD;  Location: [facility] INVASIVE CV LAB;  Service: Cardiovascular;  Laterality: Right;   TOTAL HIP ARTHROPLASTY Left 12/05/2020   Procedure: TOTAL HIP ARTHROPLASTY ANTERIOR APPROACH;  Surgeon: Kennedy Bucker, MD;  Location: [facility] ORS;  Service: Orthopedics;  Laterality: Left;    Social History Social History   Tobacco Use   Smoking status: Every Day    Packs/day: 0.25    Types: Cigarettes   Smokeless tobacco: Never  Vaping Use   Vaping Use: Never used  Substance Use Topics   Alcohol use: Yes    Alcohol/week: 12.0 standard drinks    Types: 12 Cans of beer per week    Comment: socially   Drug use:  Yes    Types: Cocaine    Comment: marijuana in the past    Family History Family History  Problem Relation Age of Onset   Diabetes Neg Hx    Hypertension Neg Hx     No Known Allergies   REVIEW OF SYSTEMS (Negative unless checked)  Constitutional: [] Weight loss  [] Fever  [] Chills Cardiac: [] Chest pain   [] Chest pressure   [] Palpitations   [] Shortness of breath when laying flat   [] Shortness of breath with exertion. Vascular:  [] Pain in legs with walking   [x] Pain in legs at rest  [x] History of DVT   [] Phlebitis   [x] Swelling in legs   [] Varicose veins   [] Non-healing ulcers Pulmonary:   [] Uses home oxygen    [] Productive cough   [] Hemoptysis   [] Wheeze  [] COPD   [] Asthma Neurologic:  [] Dizziness   [] Seizures   [] History of stroke   [] History of TIA  [] Aphasia   [] Vissual changes   [] Weakness or numbness in arm   [] Weakness or numbness in leg Musculoskeletal:   [] Joint swelling   [] Joint pain   [] Low back pain Hematologic:  [] Easy bruising  [] Easy bleeding   [x] Hypercoagulable state   [] Anemic Gastrointestinal:  [] Diarrhea   [] Vomiting  [x] Gastroesophageal reflux/heartburn   [] Difficulty swallowing. Genitourinary:  [] Chronic kidney disease   [] Difficult urination  [] Frequent urination   [] Blood in urine Skin:  [] Rashes   [] Ulcers  Psychological:  [] History of anxiety   []  History of major depression.  Physical Examination  There were no vitals filed for this visit. There is no height or weight on file to calculate BMI. Gen: WD/WN, NAD Head: [place]/AT, No temporalis wasting.  Ear/Nose/Throat: Hearing grossly intact, nares w/o erythema or drainage, pinna without lesions Eyes: PER, EOMI, sclera nonicteric.  Neck: Supple, no gross masses.  No JVD.  Pulmonary:  Good air movement, no audible wheezing, no use of accessory muscles.  Cardiac: RRR, precordium not hyperdynamic. Vascular:  scattered varicosities present bilaterally.  Mild venous stasis changes to the legs bilaterally.  2+ soft pitting edema  Vessel Right Left  Radial Palpable Palpable  Gastrointestinal: soft, non-distended. No guarding/no peritoneal signs.  Musculoskeletal: M/S 5/5 throughout.  No deformity.  Neurologic: CN 2-12 intact. Pain and light touch intact in extremities.  Symmetrical.  Speech is fluent. Motor exam as listed above. Psychiatric: Judgment intact, Mood & affect appropriate for pt's clinical situation. Dermatologic: Venous rashes no ulcers noted.  No changes consistent with cellulitis. Lymph : No lichenification or skin changes of chronic lymphedema.  CBC Lab Results  Component Value Date   WBC 5.7 11/28/2020   HGB  16.4 11/28/2020   HCT 48.0 11/28/2020   MCV 95.2 11/28/2020   PLT 175 11/28/2020    BMET    Component Value Date/Time   NA 143 11/28/2020 1439   NA 139 01/11/2020 1521   NA 136 09/27/2013 0816   K 4.0 11/28/2020 1439   K 3.9 09/27/2013 0816   CL 103 11/28/2020 1439   CL 105 09/27/2013 0816   CO2 31 11/28/2020 1439   CO2 26 09/27/2013 0816   GLUCOSE 89 11/28/2020 1439   GLUCOSE 98 09/27/2013 0816   BUN 17 11/28/2020 1439   BUN 11 01/11/2020 1521   BUN 15 09/27/2013 0816   CREATININE 1.12 11/28/2020 1439   CREATININE 0.93 09/27/2013 0816   CALCIUM 9.0 11/28/2020 1439   CALCIUM 9.2 09/27/2013 0816   GFRNONAA >60 11/28/2020 1439   GFRNONAA >60 09/27/2013 0816   GFRAA  90 01/11/2020 1521   GFRAA >60 09/27/2013 0816   CrCl cannot be calculated (Patient's most recent lab result is older than the maximum 21 days allowed.).  COAG Lab Results  Component Value Date   INR 1.15 03/20/2018   INR 1.13 03/18/2018   INR 0.89 03/14/2018    Radiology No results found.   Assessment/Plan There are no diagnoses linked to this encounter.   Levora Dredge, MD  07/09/2021 1:20 PM

## 2021-07-10 ENCOUNTER — Ambulatory Visit (INDEPENDENT_AMBULATORY_CARE_PROVIDER_SITE_OTHER): Payer: Medicare Other | Admitting: Vascular Surgery

## 2021-07-10 ENCOUNTER — Encounter (INDEPENDENT_AMBULATORY_CARE_PROVIDER_SITE_OTHER): Payer: Self-pay | Admitting: Vascular Surgery

## 2021-07-10 DIAGNOSIS — I4819 Other persistent atrial fibrillation: Secondary | ICD-10-CM

## 2021-07-10 DIAGNOSIS — I825Y2 Chronic embolism and thrombosis of unspecified deep veins of left proximal lower extremity: Secondary | ICD-10-CM

## 2021-07-10 DIAGNOSIS — I890 Lymphedema, not elsewhere classified: Secondary | ICD-10-CM

## 2021-08-12 ENCOUNTER — Ambulatory Visit (INDEPENDENT_AMBULATORY_CARE_PROVIDER_SITE_OTHER): Payer: Medicare Other

## 2021-08-12 ENCOUNTER — Other Ambulatory Visit: Payer: Self-pay

## 2021-08-12 DIAGNOSIS — I5022 Chronic systolic (congestive) heart failure: Secondary | ICD-10-CM | POA: Diagnosis not present

## 2021-08-12 DIAGNOSIS — I428 Other cardiomyopathies: Secondary | ICD-10-CM

## 2021-08-13 LAB — ECHOCARDIOGRAM COMPLETE
AR max vel: 3.67 cm2
AV Area VTI: 3.68 cm2
AV Area mean vel: 3.67 cm2
AV Mean grad: 1.5 mmHg
AV Peak grad: 2.7 mmHg
Ao pk vel: 0.82 m/s
Area-P 1/2: 5.09 cm2
Calc EF: 53.3 %
S' Lateral: 3.8 cm
Single Plane A2C EF: 53 %
Single Plane A4C EF: 54.8 %

## 2021-08-15 ENCOUNTER — Telehealth: Payer: Self-pay

## 2021-08-15 NOTE — Telephone Encounter (Signed)
Left detail message on VM of pt's recent results okay by DPR, Dr. Mariah Milling advised   "Echocardiogram  Dramatic improvement in cardiac function ejection fraction now 50 to 55%  No significant valve disease  Report much better than prior study September 2021 "  At this time, no further recommendations or medications changes, advised to call office for any concerns or questions, otherwise will see at next visit.

## 2021-08-28 ENCOUNTER — Other Ambulatory Visit: Payer: Self-pay | Admitting: Physician Assistant

## 2021-09-06 ENCOUNTER — Other Ambulatory Visit: Payer: Self-pay | Admitting: Cardiovascular Disease

## 2021-09-07 ENCOUNTER — Other Ambulatory Visit: Payer: Self-pay | Admitting: Cardiovascular Disease

## 2021-09-08 NOTE — Telephone Encounter (Signed)
Refill request

## 2021-09-08 NOTE — Telephone Encounter (Signed)
Prescription refill request for Xarelto received.  Indication: DVT/A Fib Last office visit: 06/24/21  Concha Se MD Weight: 91.7kg Age: 45 Scr: 1.12 CrCl: 108.03  Based on above findings Xarelto 20mg  daily is the appropriate dose.  Refill approved.

## 2021-10-05 ENCOUNTER — Other Ambulatory Visit: Payer: Self-pay | Admitting: Cardiovascular Disease

## 2021-12-31 ENCOUNTER — Encounter: Payer: Self-pay | Admitting: Nurse Practitioner

## 2021-12-31 ENCOUNTER — Ambulatory Visit: Payer: Self-pay | Admitting: Nurse Practitioner

## 2021-12-31 ENCOUNTER — Other Ambulatory Visit: Payer: Self-pay

## 2021-12-31 DIAGNOSIS — Z113 Encounter for screening for infections with a predominantly sexual mode of transmission: Secondary | ICD-10-CM

## 2021-12-31 DIAGNOSIS — N341 Nonspecific urethritis: Secondary | ICD-10-CM

## 2021-12-31 LAB — HEPATITIS B SURFACE ANTIGEN: Hepatitis B Surface Ag: NONREACTIVE

## 2021-12-31 LAB — HM HEPATITIS C SCREENING LAB: HM Hepatitis Screen: NEGATIVE

## 2021-12-31 LAB — GRAM STAIN

## 2021-12-31 LAB — HM HIV SCREENING LAB: HM HIV Screening: NEGATIVE

## 2021-12-31 MED ORDER — DOXYCYCLINE HYCLATE 100 MG PO TABS: 100.0000 mg | ORAL_TABLET | Freq: Two times a day (BID) | ORAL | 0 refills | Status: AC

## 2021-12-31 NOTE — Progress Notes (Signed)
[facility] Department STI clinic/screening visit  Subjective:  Henry Dunn is a 46 y.o. male being seen today for an STI screening visit. The patient reports they do have symptoms.    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  Patient reports some discharge that started one week ago.    Does the patient or their partner desires a pregnancy in the next year? No  Screening for MPX risk: Does the patient have an unexplained rash? No Is the patient MSM? No Does the patient endorse multiple sex partners or anonymous sex partners? No Did the patient have close or sexual contact with a person diagnosed with MPX? No Has the patient traveled outside the [place] where MPX is endemic? No Is there a high clinical suspicion for MPX-- evidenced by one of the following No  -Unlikely to be chickenpox  -Lymphadenopathy  -Rash that present in same phase of evolution on any given body part   See flowsheet for further details and programmatic requirements.    The following portions of the patient's history were reviewed and updated as appropriate: allergies, current medications, past medical history, past social history, past surgical history and problem  list.  Objective:  There were no vitals filed for this visit.  Physical Exam HENT:     Head: Normocephalic.     Mouth/Throat:     Lips: Pink.     Comments: No visible signs of dental caries, edentulous.  Patient unsure of last dental exam.   Eyes:     Pupils: Pupils are equal, round, and reactive to light.  Pulmonary:     Effort: Pulmonary effort is normal.  Abdominal:     General: Abdomen is flat.     Palpations: Abdomen is soft.  Genitourinary:    Comments: Pubic area without nits, lice, hair loss, edema, erythema, lesions and inguinal adenopathy. Penis circumcised without rash, lesions and discharge at meatus. Testicles descended bilaterally,nt, no masses or edema. Fine pinpoint macule to scrotum.   Musculoskeletal:     Cervical back: Full passive range of motion without pain, normal range of motion and neck supple.  Skin:    General: Skin is warm and dry.  Neurological:     Mental Status: He is alert and oriented to person, place, and time.  Psychiatric:        Attention and Perception: Attention normal.        Mood and Affect: Mood normal.        Speech: Speech normal.        Behavior: Behavior is cooperative.      Assessment and Plan:  [name] is a 46 y.o. male presenting to the [place] for STI screening  1.  Screening examination for venereal disease -46 year old male in clinic today for STD screening.  -Patient does have STI symptoms Patient accepted all screenings including  urine CT/GC and bloodwork for HIV/RPR, HBV/HCV.  Patient meets criteria for HepB screening? Yes. Ordered? Yes Patient meets criteria for HepC screening? Yes. Ordered? Yes Recommended condom use with all sex Discussed importance of condom use for STI prevent  Treat gram stain per standing order Discussed time line for State [facility] results and that patient will be called with positive results and encouraged patient to call if he had not heard in 2  weeks Recommended returning for continued or worsening symptoms.   - Chlamydia/Gonorrhea [place] [facility] - HIV/HCV [place] [facility] - Syphilis Serology, [place] [facility] - HBV Antigen/Antibody [facility] - Gram stain   2. NGU (nongonococcal urethritis) -Wet mount reviewed, patient treated per standing order.  - doxycycline (VIBRA-TABS) 100 MG tablet; Take 1 tablet (100 mg total) by mouth 2 (two) times daily for 7 days.  Dispense: 14 tablet; Refill: 0     Return if symptoms worsen or fail to improve.    Gregary Cromer, FNP

## 2021-12-31 NOTE — Progress Notes (Signed)
Pt here for STD screening.  Gram stain results reviewed and medication dispensed per Provider orders.  Pt given condoms.  Berdie Ogren, RN

## 2022-02-02 ENCOUNTER — Other Ambulatory Visit: Payer: Self-pay | Admitting: Cardiovascular Disease

## 2022-05-17 ENCOUNTER — Other Ambulatory Visit: Payer: Self-pay | Admitting: Cardiovascular Disease

## 2022-05-18 NOTE — Telephone Encounter (Signed)
Please advise if ok to continue with refill.

## 2022-06-07 ENCOUNTER — Other Ambulatory Visit: Payer: Self-pay | Admitting: Physician Assistant

## 2022-06-08 NOTE — Telephone Encounter (Signed)
Scheduled

## 2022-06-08 NOTE — Telephone Encounter (Signed)
Please schedule F/U appointment for 90 day refills. Thank you! 

## 2022-07-04 ENCOUNTER — Other Ambulatory Visit: Payer: Self-pay | Admitting: Cardiovascular Disease

## 2022-07-06 NOTE — Telephone Encounter (Signed)
Refill request

## 2022-07-06 NOTE — Telephone Encounter (Signed)
Prescription refill request for Xarelto received.  Indication: AF/DVT Last office visit: 06/24/21  Concha Se MD Weight: 91.7kg Age: 46 Scr: 1.12 on 11/28/20 CrCl: 106.89  Based on above findings Xarelto 20mg  daily is the appropriate dose.  Pt is past due to see Dr and have lab work.  He does have an appt scheduled on 09/08/22.  Will request labs be done at that time.  Refill approved x 1.

## 2022-08-07 ENCOUNTER — Other Ambulatory Visit: Payer: Self-pay | Admitting: Cardiovascular Disease

## 2022-08-07 NOTE — Telephone Encounter (Signed)
Refill request

## 2022-08-07 NOTE — Telephone Encounter (Signed)
Prescription refill request for Xarelto received.  Indication:Afib Last office visit:upcoming Weight:91.7 kg Age:46 EQA:STMHD labs CrCl:needs labs  Prescription refilled

## 2022-08-07 NOTE — Telephone Encounter (Signed)
Please review for refill. Thank you! 

## 2022-09-01 NOTE — Progress Notes (Signed)
Sent for PCP notes

## 2022-09-07 NOTE — Progress Notes (Deleted)
NO SHOW

## 2022-09-08 ENCOUNTER — Ambulatory Visit: Payer: Medicare Other | Attending: Cardiovascular Disease | Admitting: Cardiovascular Disease

## 2022-09-08 DIAGNOSIS — I5022 Chronic systolic (congestive) heart failure: Secondary | ICD-10-CM

## 2022-09-08 DIAGNOSIS — Z86718 Personal history of other venous thrombosis and embolism: Secondary | ICD-10-CM

## 2022-09-08 DIAGNOSIS — I4819 Other persistent atrial fibrillation: Secondary | ICD-10-CM

## 2022-09-08 DIAGNOSIS — I483 Typical atrial flutter: Secondary | ICD-10-CM

## 2022-09-08 DIAGNOSIS — Z86711 Personal history of pulmonary embolism: Secondary | ICD-10-CM

## 2022-09-08 DIAGNOSIS — F17200 Nicotine dependence, unspecified, uncomplicated: Secondary | ICD-10-CM

## 2022-09-08 DIAGNOSIS — I4811 Longstanding persistent atrial fibrillation: Secondary | ICD-10-CM

## 2022-09-08 DIAGNOSIS — I428 Other cardiomyopathies: Secondary | ICD-10-CM

## 2022-09-08 DIAGNOSIS — I825Y2 Chronic embolism and thrombosis of unspecified deep veins of left proximal lower extremity: Secondary | ICD-10-CM

## 2022-09-09 ENCOUNTER — Encounter: Payer: Self-pay | Admitting: Cardiovascular Disease

## 2022-09-15 ENCOUNTER — Ambulatory Visit: Payer: Medicare Other | Admitting: Cardiovascular Disease

## 2022-09-28 ENCOUNTER — Encounter (INDEPENDENT_AMBULATORY_CARE_PROVIDER_SITE_OTHER): Payer: Self-pay

## 2022-10-15 ENCOUNTER — Other Ambulatory Visit: Payer: Self-pay | Admitting: Cardiovascular Disease

## 2022-11-16 NOTE — Progress Notes (Deleted)
Cardiology Office Note  Date:  11/16/2022   ID:  Henry Dunn, DOB 06-24-1976, MRN [id number]  PCP:  [facility]   No chief complaint on file.   HPI:  Henry Dunn is a 46 year old gentleman with past medical history of Left hip dislocation right lower extremity DVT in the past, had IVC filter Chronic smoker, shortness of breath developed atrial fibrillation with RVR February 23 2017 after having groin abscess debrided, started on warfarin RT leg thrombectomy    DVT April 2019, noncompliance with warfarin IVC filter in place Echo 01/2019 left ventricle has severely reduced systolic function, with an ejection fraction of 20-25%. Atrial fibrillation starting early 2019 EF 35 to 40% on f/u echocardiogram, 9/21 He presents today for routine follow-up of his atrial fibrillation  Last seen by myself in clinic July 2022   In follow-up today reports that he is feeling relatively well Working, no symptoms of chest pain or shortness of breath Denies leg swelling no PND orthopnea Taking Lasix daily, rarely extra Lasix On Xarelto, spironolactone, metoprolol succinate Cutting back on smoking, alcohol  Discussed prior echocardiograms EF 35 to 40% on recent echocardiogram, climb from the 20 percent  EKG personally reviewed by myself on todays visit Atrial fib with rate 87 bpm no significant ST or T wave changes  Other past medical hx Hospital admission March 2020 acute on chronic diastolic and systolic CHF Presented in atrial fibrillation with RVR Reported having 1 month of shortness of breath Ejection fraction 20 to 25%, was not seen by cardiology  EKG at [facility] showing atrial fibrillation in February 2020  Several trips to the emergency room in February 2020  Echocardiogram March 2020  1. The left ventricle has severely reduced systolic function, with an ejection fraction of 20-25%. The cavity size was moderate to severely dilated. Left ventricular diastolic  function could not be evaluated. Elevated left ventricular end-diastolic  pressure Left ventricular diffuse hypokinesis.  2. Left atrial size was moderately dilated.  3. Right atrial size was mildly dilated.  4. Mitral valve regurgitation is moderate by color flow Doppler.   March 2018 Was noted to be in atrial fibrillation/flutter Converted to normal sinus rhythm in the hospital In the hospital had paroxysmal A. fib/flutter, started on metoprolol CHADS2VASc at least 1 (vascular disease)   Echocardiogram 02/18/2017 - Left ventricle: The cavity size was mildly dilated. The estimated ejection fraction was in the range of 55% to 60%. Wall motion was normal - Left atrium: The atrium was mildly dilated.    PMH:   has a past medical history of A-fib (HCC), CHF (congestive heart failure) (HCC), Chronic anticoagulation, DVT (deep venous thrombosis) (HCC), blood clots, NICM (nonischemic cardiomyopathy) (HCC), PE (pulmonary thromboembolism) (HCC), and Systolic murmur.  PSH:    Past Surgical History:  Procedure Laterality Date   EAR CYST EXCISION Left 05/14/2021   Procedure: EXCISION EYEBROW CYST;  Surgeon: Bud Face, MD;  Location: [facility];  Service: ENT;  Laterality: Left;   INSERTION OF VENA CAVA FILTER     PERIPHERAL VASCULAR THROMBECTOMY Right 03/18/2018   Procedure: PERIPHERAL VASCULAR THROMBECTOMY;  Surgeon: Renford Dills, MD;  Location: [facility] INVASIVE CV LAB;  Service: Cardiovascular;  Laterality: Right;   TOTAL HIP ARTHROPLASTY Left 12/05/2020   Procedure: TOTAL HIP ARTHROPLASTY ANTERIOR APPROACH;  Surgeon: Kennedy Bucker, MD;  Location: [facility] ORS;  Service: Orthopedics;  Laterality: Left;    Current Outpatient Medications  Medication Sig Dispense Refill   bacitracin 500 UNIT/GM ointment Apply  1 application topically 2 (two) times daily. (Patient not taking: Reported on 06/24/2021) 15 g 0   docusate sodium (COLACE) 100 MG capsule Take 1 capsule (100 mg total) by mouth 2  (two) times daily. (Patient not taking: Reported on 06/24/2021) 10 capsule 0   furosemide (LASIX) 20 MG tablet TAKE 1 TABLET BY MOUTH TWICE DAILY( 1 TABLET IN THE MORNING AND 1 ADDITIONAL TABLET IN THE EVENING FOR WEIGHT GAIN GREATER THAN 3 POUNDS OR SWELLING) 180 tablet 1   metoprolol succinate (TOPROL-XL) 100 MG 24 hr tablet TAKE 1 TABLET(100 MG) BY MOUTH DAILY 30 tablet 1   spironolactone (ALDACTONE) 25 MG tablet TAKE 1/2 TABLET BY MOUTH EVERY DAY 45 tablet 2   tadalafil (CIALIS) 20 MG tablet TAKE ONE TABLET BY MOUTH ONE TIME DAILY AS NEEDED FOR ERECTILE DYSFUNCTION 30 tablet 2   XARELTO 20 MG TABS tablet TAKE 1 TABLET(20 MG) BY MOUTH DAILY WITH SUPPER 90 tablet 0   No current facility-administered medications for this visit.    Allergies:   Patient has no known allergies.   Social History:  The patient  reports that he has been smoking cigarettes. He has been smoking an average of .13 packs per day. He has never used smokeless tobacco. He reports current alcohol use of about 12.0 standard drinks of alcohol per week. He reports that he does not currently use drugs after having used the following drugs: Cocaine and Marijuana.   Family History:   family history is not on file.    Review of Systems: Review of Systems  Constitutional: Negative.   HENT: Negative.    Respiratory: Negative.    Cardiovascular: Negative.   Gastrointestinal: Negative.   Musculoskeletal: Negative.   Neurological: Negative.   Psychiatric/Behavioral: Negative.    All other systems reviewed and are negative.   PHYSICAL EXAM: VS:  There were no vitals taken for this visit. , BMI There is no height or weight on file to calculate BMI. Constitutional:  oriented to person, place, and time. No distress.  HENT:  Head: Grossly normal Eyes:  no discharge. No scleral icterus.  Neck: No JVD, no carotid bruits  Cardiovascular: Regular rate and rhythm, no murmurs appreciated Pulmonary/Chest: Clear to auscultation  bilaterally, no wheezes or rails Abdominal: Soft.  no distension.  no tenderness.  Musculoskeletal: Normal range of motion Neurological:  normal muscle tone. Coordination normal. No atrophy Skin: Skin warm and dry Psychiatric: normal affect, pleasant  Recent Labs: No results found for requested labs within last 365 days.    Lipid Panel No results found for: "CHOL", "HDL", "LDLCALC", "TRIG"    Wt Readings from Last 3 Encounters:  06/24/21 202 lb 4 oz (91.7 kg)  05/14/21 208 lb (94.3 kg)  12/05/20 211 lb 10.3 oz (96 kg)     ASSESSMENT AND PLAN: Atrial fib, persistent On Xarelto, rate controlled on metoprolol Has been in atrial fibrillation past 2 years, Previously declined cardioversion secondary to psychosocial issues Improved ejection fraction 35 to 40%, stable Repeat echocardiogram later 2022 for comparison Discussed adding digoxin for rate control, he would like to hold off for now   Chronic deep vein thrombosis (DVT) of proximal vein of left lower extremity PE, in the past IVC filter was placed  Thrombus in the lower extremities treated by vascular Remains on Xarelto given atrial fibrillation  Chronic left hip pain Completed surgery, pain improved   Smoker We have encouraged him to continue to work on weaning his cigarettes and smoking cessation. He will  continue to work on this and does not want any assistance with chantix.   Acute on chronic diastolic and systolic CHF Previous EF 25% up to 35 to 40% Currently on spironolactone metoprolol Lasix Suggested Farxiga/Jardiance, digoxin He would like to delay adding additional medications at this time After further discussion, repeat echocardiogram ordered  If ejection fraction does not continue to improve consider adding Jardiance/Farxiga, digoxin    Total encounter time more than 25 minutes  Greater than 50% was spent in counseling and coordination of care with the patient   Signed, Dossie Arbour, M.D.,  Ph.D. [facility] [place], [place] [phone]

## 2022-11-17 ENCOUNTER — Ambulatory Visit: Payer: Medicare Other | Attending: Cardiovascular Disease | Admitting: Cardiovascular Disease

## 2022-11-17 ENCOUNTER — Encounter: Payer: Self-pay | Admitting: Cardiovascular Disease

## 2022-11-17 DIAGNOSIS — Z86718 Personal history of other venous thrombosis and embolism: Secondary | ICD-10-CM

## 2022-11-17 DIAGNOSIS — I428 Other cardiomyopathies: Secondary | ICD-10-CM

## 2022-11-17 DIAGNOSIS — I420 Dilated cardiomyopathy: Secondary | ICD-10-CM

## 2022-11-17 DIAGNOSIS — I5022 Chronic systolic (congestive) heart failure: Secondary | ICD-10-CM

## 2022-11-17 DIAGNOSIS — I483 Typical atrial flutter: Secondary | ICD-10-CM

## 2022-11-17 DIAGNOSIS — Z86711 Personal history of pulmonary embolism: Secondary | ICD-10-CM

## 2022-11-17 DIAGNOSIS — I4811 Longstanding persistent atrial fibrillation: Secondary | ICD-10-CM

## 2022-11-17 DIAGNOSIS — F17200 Nicotine dependence, unspecified, uncomplicated: Secondary | ICD-10-CM

## 2022-11-24 ENCOUNTER — Other Ambulatory Visit: Payer: Self-pay

## 2022-11-24 MED ORDER — SPIRONOLACTONE 25 MG PO TABS: 12.5000 mg | ORAL_TABLET | Freq: Every day | ORAL | 2 refills | Status: AC

## 2022-11-26 ENCOUNTER — Emergency Department
Admission: EM | Admit: 2022-11-26 | Discharge: 2022-11-26 | Disposition: A | Discharge status: Home / Self Care | Payer: Medicare Other | Attending: Emergency Medicine | Admitting: Emergency Medicine

## 2022-11-26 ENCOUNTER — Other Ambulatory Visit: Payer: Self-pay

## 2022-11-26 DIAGNOSIS — L03116 Cellulitis of left lower limb: Secondary | ICD-10-CM | POA: Diagnosis not present

## 2022-11-26 DIAGNOSIS — L089 Local infection of the skin and subcutaneous tissue, unspecified: Secondary | ICD-10-CM

## 2022-11-26 DIAGNOSIS — I509 Heart failure, unspecified: Secondary | ICD-10-CM | POA: Insufficient documentation

## 2022-11-26 DIAGNOSIS — L853 Xerosis cutis: Secondary | ICD-10-CM | POA: Diagnosis present

## 2022-11-26 MED ORDER — CEPHALEXIN 500 MG PO CAPS: 500.0000 mg | ORAL_CAPSULE | Freq: Three times a day (TID) | ORAL | 0 refills | Status: AC

## 2022-11-26 MED ORDER — MUPIROCIN 2 % EX OINT: TOPICAL_OINTMENT | CUTANEOUS | 0 refills | Status: AC

## 2022-11-26 NOTE — Discharge Instructions (Addendum)
Keep the wound clean, dry, and covered.  Apply small amount antibiotic to the wound.  Avoid any use of alcohol or peroxide.  Take antibiotic as directed.  Follow with your primary provider or return to the ED if needed.

## 2022-11-26 NOTE — ED Triage Notes (Signed)
Reports dog bite to L lower leg several weeks ago. Reports redness and slow healing. Denies hx of DM. No active drainage or weeping noted. Provider in triage room speaking with pt. Pt ambulatory to triage. Breathing unlabored speaking in full sentences. Pt denies fever. Pt alert and oriented.

## 2022-11-26 NOTE — ED Provider Notes (Signed)
[facility] Emergency Department Provider Note     None    (approximate)   History   Wound Check   HPI  Henry Dunn is a 46 y.o. male with a history of DVT and CHF, presents to the ED for evaluation of a scratchy obtained to his left lower leg several weeks ago by his pitbull puppy.  He reports redness and slow healing to the area.  Denies any history of diabetes.  He presents with no active bleeding, weeping, or drainage noted denies any fevers, chills, or sweats.  Patient has been treating the wound with topical antibiotic ointment, alcohol, and peroxide.  He reports a scabbed wound with dry skin surrounding but denies any warmth, induration, or purulence.  Current tetanus status.   Physical Exam   Triage Vital Signs: ED Triage Vitals  Enc Vitals Group     BP 11/26/22 2049 (!) 144/101     Pulse Rate 11/26/22 2049 (!) 122     Resp 11/26/22 2049 20     Temp 11/26/22 2053 98 F (36.7 C)     Temp Source 11/26/22 2049 Oral     SpO2 11/26/22 2049 98 %     Weight 11/26/22 2053 210 lb (95.3 kg)     Height 11/26/22 2053 5\' 9"  (1.753 m)     Head Circumference --      Peak Flow --      Pain Score 11/26/22 2053 2     Pain Loc --      Pain Edu? --      Excl. in GC? --     Most recent vital signs: Vitals:   11/26/22 2049 11/26/22 2053  BP: (!) 144/101   Pulse: (!) 122   Resp: 20   Temp:  98 F (36.7 C)  SpO2: 98%     General Awake, no distress.  CV:  Good peripheral perfusion.  RESP:  Normal effort.  ABD:  No distention.  SKIN:  Left lower leg with a 2 cm circular scabbed lesion to the medial calf.  No surrounding induration, warmth, purulence is noted.  Scab is dry in nature and surrounding skin is flaking and peeling in nature.  No lymphangitis, bleeding, or serosanguineous drainage is noted.   ED Results / Procedures / Treatments   Labs (all labs ordered are listed, but only abnormal results are displayed) Labs Reviewed - No data to  display   EKG   RADIOLOGY  No results found.   PROCEDURES:  Critical Care performed: No  Procedures   MEDICATIONS ORDERED IN ED: Medications - No data to display   IMPRESSION / MDM / ASSESSMENT AND PLAN / ED COURSE  I reviewed the triage vital signs and the nursing notes.                              Differential diagnosis includes, but is not limited to, cellulitis, abrasion, wound infection, wound dehiscence  Patient's presentation is most consistent with acute, uncomplicated illness.  Patient to the ED for evaluation of a scabbed wound to the left medial calf, patient without toxic appearance, fever, purulent drainage noted.  The wound appears well-healed, but likely irritated by his ongoing wound care management.  Patient's diagnosis is consistent with cellulitis. Patient will be discharged home with prescriptions for mupirocin ointment and Keflex. Patient is to follow up with his primary provider or local urgent care, as needed or  otherwise directed. Patient is given ED precautions to return to the ED for any worsening or new symptoms.     FINAL CLINICAL IMPRESSION(S) / ED DIAGNOSES   Final diagnoses:  Wound infection  Cellulitis of left lower extremity     Rx / DC Orders   ED Discharge Orders          Ordered    cephALEXin (KEFLEX) 500 MG capsule  3 times daily        11/26/22 2117    mupirocin ointment (BACTROBAN) 2 %        11/26/22 2117             Note:  This document was prepared using Dragon voice recognition software and may include unintentional dictation errors.    Lissa Hoard, PA-C 11/26/22 2317    Minna Antis, MD 11/27/22 2202

## 2022-12-06 NOTE — Progress Notes (Deleted)
Cardiology Office Note  Date:  12/06/2022   ID:  Henry Dunn, DOB [birth year], MRN [id number]  PCP:  [facility]   No chief complaint on file.   HPI:  Henry Dunn is a 47 year old gentleman with past medical history of Left hip dislocation right lower extremity DVT in the past, had IVC filter Chronic smoker, shortness of breath developed atrial fibrillation with RVR February 23 2017 after having groin abscess debrided, started on warfarin RT leg thrombectomy    DVT April 2019, noncompliance with warfarin IVC filter in place Echo 01/2019 left ventricle has severely reduced systolic function, with an ejection fraction of 20-25%. Atrial fibrillation starting early 2019 EF 35 to 40% on f/u echocardiogram, 9/21 He presents today for routine follow-up of his atrial fibrillation  Last seen by myself in clinic July 2022   In follow-up today reports that he is feeling relatively well Working, no symptoms of chest pain or shortness of breath Denies leg swelling no PND orthopnea Taking Lasix daily, rarely extra Lasix On Xarelto, spironolactone, metoprolol succinate Cutting back on smoking, alcohol  Discussed prior echocardiograms EF 35 to 40% on recent echocardiogram, climb from the 20 percent  EKG personally reviewed by myself on todays visit Atrial fib with rate 87 bpm no significant ST or T wave changes  Other past medical hx Hospital admission March 2020 acute on chronic diastolic and systolic CHF Presented in atrial fibrillation with RVR Reported having 1 month of shortness of breath Ejection fraction 20 to 25%, was not seen by cardiology  EKG at [facility] showing atrial fibrillation in February 2020  Several trips to the emergency room in February 2020  Echocardiogram March 2020  1. The left ventricle has severely reduced systolic function, with an ejection fraction of 20-25%. The cavity size was moderate to severely dilated. Left ventricular diastolic function  could not be evaluated. Elevated left ventricular end-diastolic  pressure Left ventricular diffuse hypokinesis.  2. Left atrial size was moderately dilated.  3. Right atrial size was mildly dilated.  4. Mitral valve regurgitation is moderate by color flow Doppler.   March 2018 Was noted to be in atrial fibrillation/flutter Converted to normal sinus rhythm in the hospital In the hospital had paroxysmal A. fib/flutter, started on metoprolol CHADS2VASc at least 1 (vascular disease)   Echocardiogram 02/18/2017 - Left ventricle: The cavity size was mildly dilated. The estimated ejection fraction was in the range of 55% to 60%. Wall motion was normal - Left atrium: The atrium was mildly dilated.    PMH:   has a past medical history of A-fib (HCC), CHF (congestive heart failure) (HCC), Chronic anticoagulation, DVT (deep venous thrombosis) (HCC), blood clots, NICM (nonischemic cardiomyopathy) (HCC), PE (pulmonary thromboembolism) (HCC), and Systolic murmur.  PSH:    Past Surgical History:  Procedure Laterality Date   EAR CYST EXCISION Left 05/14/2021   Procedure: EXCISION EYEBROW CYST;  Surgeon: Bud Face, MD;  Location: [facility];  Service: ENT;  Laterality: Left;   INSERTION OF VENA CAVA FILTER     PERIPHERAL VASCULAR THROMBECTOMY Right 03/18/2018   Procedure: PERIPHERAL VASCULAR THROMBECTOMY;  Surgeon: Renford Dills, MD;  Location: [facility] INVASIVE CV LAB;  Service: Cardiovascular;  Laterality: Right;   TOTAL HIP ARTHROPLASTY Left 12/05/2020   Procedure: TOTAL HIP ARTHROPLASTY ANTERIOR APPROACH;  Surgeon: Kennedy Bucker, MD;  Location: [facility] ORS;  Service: Orthopedics;  Laterality: Left;    Current Outpatient Medications  Medication Sig Dispense Refill   furosemide (LASIX) 20 MG tablet  TAKE 1 TABLET BY MOUTH TWICE DAILY( 1 TABLET IN THE MORNING AND 1 ADDITIONAL TABLET IN THE EVENING FOR WEIGHT GAIN GREATER THAN 3 POUNDS OR SWELLING) 180 tablet 1   metoprolol succinate  (TOPROL-XL) 100 MG 24 hr tablet TAKE 1 TABLET(100 MG) BY MOUTH DAILY 30 tablet 1   mupirocin ointment (BACTROBAN) 2 % Apply to affected area 3 times daily 22 g 0   spironolactone (ALDACTONE) 25 MG tablet Take 0.5 tablets (12.5 mg total) by mouth daily. 45 tablet 2   tadalafil (CIALIS) 20 MG tablet TAKE ONE TABLET BY MOUTH ONE TIME DAILY AS NEEDED FOR ERECTILE DYSFUNCTION 30 tablet 2   XARELTO 20 MG TABS tablet TAKE 1 TABLET(20 MG) BY MOUTH DAILY WITH SUPPER 90 tablet 0   No current facility-administered medications for this visit.    Allergies:   Patient has no known allergies.   Social History:  The patient  reports that he has been smoking cigarettes. He has been smoking an average of .13 packs per day. He has never used smokeless tobacco. He reports current alcohol use of about 12.0 standard drinks of alcohol per week. He reports that he does not currently use drugs after having used the following drugs: Cocaine and Marijuana.   Family History:   family history is not on file.    Review of Systems: Review of Systems  Constitutional: Negative.   HENT: Negative.    Respiratory: Negative.    Cardiovascular: Negative.   Gastrointestinal: Negative.   Musculoskeletal: Negative.   Neurological: Negative.   Psychiatric/Behavioral: Negative.    All other systems reviewed and are negative.   PHYSICAL EXAM: VS:  There were no vitals taken for this visit. , BMI There is no height or weight on file to calculate BMI. Constitutional:  oriented to person, place, and time. No distress.  HENT:  Head: Grossly normal Eyes:  no discharge. No scleral icterus.  Neck: No JVD, no carotid bruits  Cardiovascular: Regular rate and rhythm, no murmurs appreciated Pulmonary/Chest: Clear to auscultation bilaterally, no wheezes or rails Abdominal: Soft.  no distension.  no tenderness.  Musculoskeletal: Normal range of motion Neurological:  normal muscle tone. Coordination normal. No atrophy Skin: Skin  warm and dry Psychiatric: normal affect, pleasant  Recent Labs: No results found for requested labs within last 365 days.    Lipid Panel No results found for: "CHOL", "HDL", "LDLCALC", "TRIG"    Wt Readings from Last 3 Encounters:  11/26/22 210 lb (95.3 kg)  06/24/21 202 lb 4 oz (91.7 kg)  05/14/21 208 lb (94.3 kg)     ASSESSMENT AND PLAN: Atrial fib, persistent On Xarelto, rate controlled on metoprolol Has been in atrial fibrillation past 2 years, Previously declined cardioversion secondary to psychosocial issues Improved ejection fraction 35 to 40%, stable Repeat echocardiogram later 2022 for comparison Discussed adding digoxin for rate control, he would like to hold off for now   Chronic deep vein thrombosis (DVT) of proximal vein of left lower extremity PE, in the past IVC filter was placed  Thrombus in the lower extremities treated by vascular Remains on Xarelto given atrial fibrillation  Chronic left hip pain Completed surgery, pain improved   Smoker We have encouraged him to continue to work on weaning his cigarettes and smoking cessation. He will continue to work on this and does not want any assistance with chantix.   Acute on chronic diastolic and systolic CHF Previous EF 25% up to 35 to 40% Currently on spironolactone metoprolol Lasix  Suggested Farxiga/Jardiance, digoxin He would like to delay adding additional medications at this time After further discussion, repeat echocardiogram ordered  If ejection fraction does not continue to improve consider adding Jardiance/Farxiga, digoxin    Total encounter time more than 25 minutes  Greater than 50% was spent in counseling and coordination of care with the patient   Signed, Esmond Plants, M.D., Ph.D. [place], [place]

## 2022-12-07 ENCOUNTER — Encounter: Payer: Self-pay | Admitting: *Deleted

## 2022-12-07 ENCOUNTER — Ambulatory Visit: Payer: Medicare Other | Attending: Cardiovascular Disease | Admitting: Cardiovascular Disease

## 2022-12-07 DIAGNOSIS — F17200 Nicotine dependence, unspecified, uncomplicated: Secondary | ICD-10-CM

## 2022-12-07 DIAGNOSIS — Z86711 Personal history of pulmonary embolism: Secondary | ICD-10-CM

## 2022-12-07 DIAGNOSIS — I4811 Longstanding persistent atrial fibrillation: Secondary | ICD-10-CM

## 2022-12-07 DIAGNOSIS — I428 Other cardiomyopathies: Secondary | ICD-10-CM

## 2022-12-07 DIAGNOSIS — I5022 Chronic systolic (congestive) heart failure: Secondary | ICD-10-CM

## 2022-12-07 DIAGNOSIS — Z86718 Personal history of other venous thrombosis and embolism: Secondary | ICD-10-CM

## 2022-12-07 DIAGNOSIS — I4819 Other persistent atrial fibrillation: Secondary | ICD-10-CM

## 2022-12-07 DIAGNOSIS — I483 Typical atrial flutter: Secondary | ICD-10-CM

## 2022-12-08 ENCOUNTER — Telehealth: Payer: Self-pay | Admitting: Cardiovascular Disease

## 2022-12-08 NOTE — Telephone Encounter (Signed)
Patient dismissed from [facility] by Dr. Ida Rogue, effective 12/07/2022. Dismissal letter was sent out by 1st class mail. Js.

## 2022-12-09 ENCOUNTER — Telehealth: Payer: Self-pay | Admitting: Cardiovascular Disease

## 2022-12-09 MED ORDER — METOPROLOL SUCCINATE ER 100 MG PO TB24: ORAL_TABLET | ORAL | 0 refills | Status: AC

## 2022-12-09 NOTE — Telephone Encounter (Signed)
Patient dismissed from the practice. Refill sent to pharmacy only for 30 days per Izora Gala. Message sent to scheduler to inform them of recent dismissal from practice. The appointment that was made has been cancelled.

## 2022-12-09 NOTE — Telephone Encounter (Signed)
*  STAT* If patient is at the pharmacy, call can be transferred to refill team.   1. Which medications need to be refilled? (please list name of each medication and dose if known) metoprolol succinate (TOPROL-XL) 100 MG 24 hr tablet   2. Which pharmacy/location (including street and city if local pharmacy) is medication to be sent to? [facility] [place], [address] AT [facility] [address]  3. Do they need a 30 day or 90 day supply? [place]

## 2022-12-24 ENCOUNTER — Other Ambulatory Visit: Payer: Self-pay | Admitting: Cardiovascular Disease

## 2023-01-04 ENCOUNTER — Other Ambulatory Visit: Payer: Self-pay | Admitting: Cardiovascular Disease

## 2023-01-19 ENCOUNTER — Ambulatory Visit: Payer: Medicare Other | Admitting: Cardiovascular Disease

## 2023-01-22 ENCOUNTER — Other Ambulatory Visit: Payer: Self-pay | Admitting: Cardiovascular Disease

## 2023-02-01 ENCOUNTER — Other Ambulatory Visit: Payer: Self-pay | Admitting: Cardiovascular Disease

## 2023-02-01 ENCOUNTER — Telehealth: Payer: Self-pay | Admitting: Cardiovascular Disease

## 2023-02-01 NOTE — Telephone Encounter (Signed)
Spoke with patient and informed him that patient is not allowed to see another [facility] location due to the chronic "no shows" and late cancellations, but could schedule with another local cardiology clinic in town.[facility] or [facility]. We would be happy to send his records to either of those offices once he establishes care with them. Patient understood and asked if he could get refills on cardiac medications while waiting to establish with another cardiologist. Message sent to provider.

## 2023-02-01 NOTE — Telephone Encounter (Signed)
Pt called regarding the letter he recently received stating he had been dismissed from the practice. Pt was asking why he had been dismissed, advised the pt it was likely due to his 3 no-shows within his last 5 appts. Advised the pt of the dates of the no-shows vs the dates of his canceled appts. He stated that he did not have transportation for those appts. He would like to know if he able to see another provider at another location.

## 2023-02-04 NOTE — Telephone Encounter (Signed)
Spoke with patient and informed him of Dr. Donivan Scull recommendations as follows:  Last seen by myself in 05/2021 We do not have lab work to review to assist with refills Would recommend he follow-up with primary care for refills until he is able to establish with new cardiology group Thx TGollan   Patient understood and stated that he has an appointment with a new cardiologist and will ask his PCP to fill them until then

## 2023-02-15 ENCOUNTER — Encounter: Payer: Self-pay | Admitting: Family

## 2023-02-15 ENCOUNTER — Ambulatory Visit: Payer: Medicare Other | Admitting: Family

## 2023-02-15 DIAGNOSIS — Z113 Encounter for screening for infections with a predominantly sexual mode of transmission: Secondary | ICD-10-CM

## 2023-02-15 LAB — GRAM STAIN

## 2023-02-15 NOTE — Progress Notes (Unsigned)
[facility] Department STI clinic/screening visit  Subjective:  Henry Dunn is a 47 y.o. male being seen today for an STI screening visit. The patient reports they {Actions; do/do not:19616} have symptoms.    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   Last HIV test per patient/review of record was  Lab Results  Component Value Date   HMHIVSCREEN Negative - Validated 12/31/2021    Lab Results  Component Value Date   HIV Non Reactive 02/22/2019    Does the patient or their partner desires a pregnancy in the next year? {yes/no:20286}  Screening for MPX risk: Does the patient have an unexplained rash? {yes/no:20286} Is the patient MSM? {yes/no:20286} Does the patient endorse multiple sex partners or anonymous sex partners? {yes/no:20286} Did the patient have close or sexual contact with a person diagnosed with MPX? {yes/no:20286} Has the patient traveled outside the [place] where MPX is endemic? {yes/no:20286} Is there a high clinical suspicion for MPX-- evidenced by one of the following {yes/no:20286}  -Unlikely to be chickenpox  -Lymphadenopathy  -Rash  that present in same phase of evolution on any given body part   See flowsheet for further details and programmatic requirements.    There is no immunization history on file for this patient.   The following portions of the patient's history were reviewed and updated as appropriate: allergies, current medications, past medical history, past social history, past surgical history and problem list.  Objective:  There were no vitals filed for this visit.  Physical Exam    Assessment and Plan:  Henry Dunn is a 47 y.o. male presenting to the [facility] for STI screening  1. Screening examination for venereal disease *** - Gram stain - Chlamydia/GC NAA, Confirmation Needs Dental Resource List  Patient {Action; does/does not:19097} have STI symptoms Patient accepted all screenings including  urine GC/Chlamydia, and blood work for HIV/Syphilis. Patient meets criteria for HepB screening? {yes/no:20286}. Ordered? {Response; yes/no/na:63} Patient meets criteria for HepC screening? {yes/no:20286}. Ordered? {Response; yes/no/na:63} Recommended condom use with all sex Discussed importance of condom use for STI prevent  Treat positive test results per standing order. Discussed time line for [facility] results and that patient will be called with positive results and encouraged patient to call if he had not heard in 2 weeks Recommended repeat testing in 3 months with positive results. Recommended returning for continued or worsening symptoms.   Return if symptoms worsen or fail to improve.  No future appointments.  Marline Backbone, FNP

## 2023-02-15 NOTE — Progress Notes (Unsigned)
Pt is here for STD screening.  Gram stain results reviewed, no treatment required per SO.  Pt declined condoms.  Windle Guard, RN

## 2023-02-18 LAB — CHLAMYDIA/GC NAA, CONFIRMATION
Chlamydia trachomatis, NAA: NEGATIVE
Neisseria gonorrhoeae, NAA: NEGATIVE

## 2023-02-19 ENCOUNTER — Other Ambulatory Visit: Payer: Self-pay | Admitting: Cardiovascular Disease

## 2023-06-17 ENCOUNTER — Other Ambulatory Visit: Payer: Self-pay | Admitting: Cardiovascular Disease

## 2023-07-11 ENCOUNTER — Other Ambulatory Visit: Payer: Self-pay | Admitting: Cardiovascular Disease
